# Patient Record
Sex: Female | Born: 1980 | Race: White | Hispanic: No | Marital: Married | Smoking: Never smoker
Health system: Southern US, Community
[De-identification: ages and names within clinical notes are randomized; demographics above are authoritative.]

## PROBLEM LIST (undated history)

## (undated) DIAGNOSIS — R0602 Shortness of breath: Secondary | ICD-10-CM

## (undated) DIAGNOSIS — K219 Gastro-esophageal reflux disease without esophagitis: Secondary | ICD-10-CM

## (undated) DIAGNOSIS — N63 Unspecified lump in unspecified breast: Secondary | ICD-10-CM

## (undated) DIAGNOSIS — L708 Other acne: Secondary | ICD-10-CM

## (undated) DIAGNOSIS — R911 Solitary pulmonary nodule: Secondary | ICD-10-CM

## (undated) DIAGNOSIS — G43909 Migraine, unspecified, not intractable, without status migrainosus: Secondary | ICD-10-CM

## (undated) DIAGNOSIS — G40A09 Absence epileptic syndrome, not intractable, without status epilepticus: Secondary | ICD-10-CM

## (undated) DIAGNOSIS — K56609 Unspecified intestinal obstruction, unspecified as to partial versus complete obstruction: Secondary | ICD-10-CM

## (undated) DIAGNOSIS — N920 Excessive and frequent menstruation with regular cycle: Secondary | ICD-10-CM

## (undated) DIAGNOSIS — B958 Unspecified staphylococcus as the cause of diseases classified elsewhere: Secondary | ICD-10-CM

## (undated) DIAGNOSIS — O09299 Supervision of pregnancy with other poor reproductive or obstetric history, unspecified trimester: Secondary | ICD-10-CM

## (undated) DIAGNOSIS — R569 Unspecified convulsions: Secondary | ICD-10-CM

## (undated) DIAGNOSIS — A419 Sepsis, unspecified organism: Secondary | ICD-10-CM

## (undated) DIAGNOSIS — G40909 Epilepsy, unspecified, not intractable, without status epilepticus: Secondary | ICD-10-CM

## (undated) DIAGNOSIS — M797 Fibromyalgia: Secondary | ICD-10-CM

## (undated) DIAGNOSIS — N200 Calculus of kidney: Secondary | ICD-10-CM

## (undated) DIAGNOSIS — G930 Cerebral cysts: Secondary | ICD-10-CM

## (undated) DIAGNOSIS — N841 Polyp of cervix uteri: Secondary | ICD-10-CM

## (undated) DIAGNOSIS — K589 Irritable bowel syndrome without diarrhea: Secondary | ICD-10-CM

## (undated) DIAGNOSIS — C4370 Malignant melanoma of unspecified lower limb, including hip: Secondary | ICD-10-CM

## (undated) DIAGNOSIS — R87619 Unspecified abnormal cytological findings in specimens from cervix uteri: Secondary | ICD-10-CM

## (undated) HISTORY — PX: OTHER SURGICAL HISTORY: SHX169

## (undated) HISTORY — DX: Irritable bowel syndrome, unspecified: K58.9

## (undated) HISTORY — DX: Migraine, unspecified, not intractable, without status migrainosus: G43.909

## (undated) HISTORY — PX: BREAST EXCISIONAL BIOPSY: SUR124

## (undated) HISTORY — DX: Solitary pulmonary nodule: R91.1

## (undated) HISTORY — PX: MELANOMA EXCISION: SHX5266

---

## 2001-07-27 ENCOUNTER — Ambulatory Visit (HOSPITAL_COMMUNITY): Admission: RE | Admit: 2001-07-27 | Discharge: 2001-07-27 | Payer: Self-pay | Admitting: Obstetrics & Gynecology

## 2001-07-27 ENCOUNTER — Encounter: Payer: Self-pay | Admitting: Obstetrics & Gynecology

## 2001-08-07 ENCOUNTER — Observation Stay (HOSPITAL_COMMUNITY): Admission: RE | Admit: 2001-08-07 | Discharge: 2001-08-07 | Payer: Self-pay | Admitting: Obstetrics and Gynecology

## 2001-11-15 ENCOUNTER — Ambulatory Visit (HOSPITAL_COMMUNITY): Admission: AD | Admit: 2001-11-15 | Discharge: 2001-11-15 | Payer: Self-pay | Admitting: Obstetrics & Gynecology

## 2001-12-15 ENCOUNTER — Ambulatory Visit (HOSPITAL_COMMUNITY): Admission: AD | Admit: 2001-12-15 | Discharge: 2001-12-15 | Payer: Self-pay | Admitting: Internal Medicine

## 2001-12-29 ENCOUNTER — Ambulatory Visit (HOSPITAL_COMMUNITY): Admission: AD | Admit: 2001-12-29 | Discharge: 2001-12-29 | Payer: Self-pay | Admitting: Obstetrics & Gynecology

## 2002-01-19 ENCOUNTER — Ambulatory Visit (HOSPITAL_COMMUNITY): Admission: AD | Admit: 2002-01-19 | Discharge: 2002-01-19 | Payer: Self-pay | Admitting: Obstetrics & Gynecology

## 2002-01-23 ENCOUNTER — Ambulatory Visit (HOSPITAL_COMMUNITY): Admission: AD | Admit: 2002-01-23 | Discharge: 2002-01-23 | Payer: Self-pay | Admitting: Obstetrics & Gynecology

## 2002-01-26 ENCOUNTER — Ambulatory Visit (HOSPITAL_COMMUNITY): Admission: AD | Admit: 2002-01-26 | Discharge: 2002-01-26 | Payer: Self-pay | Admitting: Obstetrics and Gynecology

## 2002-01-30 ENCOUNTER — Ambulatory Visit (HOSPITAL_COMMUNITY): Admission: AD | Admit: 2002-01-30 | Discharge: 2002-01-30 | Payer: Self-pay | Admitting: Obstetrics & Gynecology

## 2002-02-02 ENCOUNTER — Observation Stay (HOSPITAL_COMMUNITY): Admission: AD | Admit: 2002-02-02 | Discharge: 2002-02-03 | Payer: Self-pay | Admitting: Obstetrics & Gynecology

## 2002-02-06 ENCOUNTER — Inpatient Hospital Stay (HOSPITAL_COMMUNITY): Admission: AD | Admit: 2002-02-06 | Discharge: 2002-02-10 | Payer: Self-pay | Admitting: Obstetrics and Gynecology

## 2003-03-08 ENCOUNTER — Emergency Department (HOSPITAL_COMMUNITY): Admission: EM | Admit: 2003-03-08 | Discharge: 2003-03-08 | Payer: Self-pay | Admitting: Emergency Medicine

## 2003-03-08 ENCOUNTER — Encounter: Payer: Self-pay | Admitting: Emergency Medicine

## 2004-02-19 ENCOUNTER — Emergency Department (HOSPITAL_COMMUNITY): Admission: EM | Admit: 2004-02-19 | Discharge: 2004-02-19 | Payer: Self-pay | Admitting: Emergency Medicine

## 2004-08-15 ENCOUNTER — Emergency Department (HOSPITAL_COMMUNITY): Admission: EM | Admit: 2004-08-15 | Discharge: 2004-08-15 | Payer: Self-pay | Admitting: Emergency Medicine

## 2005-01-21 ENCOUNTER — Ambulatory Visit (HOSPITAL_COMMUNITY): Admission: RE | Admit: 2005-01-21 | Discharge: 2005-01-21 | Payer: Self-pay | Admitting: Family Medicine

## 2005-05-10 ENCOUNTER — Emergency Department (HOSPITAL_COMMUNITY): Admission: EM | Admit: 2005-05-10 | Discharge: 2005-05-10 | Payer: Self-pay | Admitting: *Deleted

## 2005-08-05 ENCOUNTER — Ambulatory Visit (HOSPITAL_COMMUNITY): Admission: RE | Admit: 2005-08-05 | Discharge: 2005-08-05 | Payer: Self-pay | Admitting: Family Medicine

## 2006-05-01 ENCOUNTER — Inpatient Hospital Stay (HOSPITAL_COMMUNITY): Admission: EM | Admit: 2006-05-01 | Discharge: 2006-05-05 | Payer: Self-pay | Admitting: Emergency Medicine

## 2006-05-01 ENCOUNTER — Ambulatory Visit: Payer: Self-pay | Admitting: Internal Medicine

## 2006-06-15 HISTORY — PX: BREAST LUMPECTOMY: SHX2

## 2006-09-01 ENCOUNTER — Ambulatory Visit (HOSPITAL_COMMUNITY): Admission: RE | Admit: 2006-09-01 | Discharge: 2006-09-01 | Payer: Self-pay | Admitting: Family Medicine

## 2006-09-20 ENCOUNTER — Ambulatory Visit (HOSPITAL_COMMUNITY): Admission: RE | Admit: 2006-09-20 | Discharge: 2006-09-20 | Payer: Self-pay | Admitting: General Surgery

## 2006-09-20 ENCOUNTER — Encounter (INDEPENDENT_AMBULATORY_CARE_PROVIDER_SITE_OTHER): Payer: Self-pay | Admitting: *Deleted

## 2007-01-31 ENCOUNTER — Ambulatory Visit (HOSPITAL_COMMUNITY): Admission: RE | Admit: 2007-01-31 | Discharge: 2007-01-31 | Payer: Self-pay | Admitting: Family Medicine

## 2007-04-15 ENCOUNTER — Emergency Department (HOSPITAL_COMMUNITY): Admission: EM | Admit: 2007-04-15 | Discharge: 2007-04-15 | Payer: Self-pay | Admitting: Emergency Medicine

## 2007-10-08 ENCOUNTER — Emergency Department (HOSPITAL_COMMUNITY): Admission: EM | Admit: 2007-10-08 | Discharge: 2007-10-08 | Payer: Self-pay | Admitting: Emergency Medicine

## 2008-03-05 ENCOUNTER — Ambulatory Visit (HOSPITAL_COMMUNITY): Admission: RE | Admit: 2008-03-05 | Discharge: 2008-03-05 | Payer: Self-pay | Admitting: Family Medicine

## 2008-09-12 ENCOUNTER — Emergency Department (HOSPITAL_COMMUNITY): Admission: EM | Admit: 2008-09-12 | Discharge: 2008-09-12 | Payer: Self-pay | Admitting: Emergency Medicine

## 2008-09-13 ENCOUNTER — Ambulatory Visit (HOSPITAL_COMMUNITY): Admission: RE | Admit: 2008-09-13 | Discharge: 2008-09-13 | Payer: Self-pay | Admitting: Family Medicine

## 2008-09-14 ENCOUNTER — Encounter: Payer: Self-pay | Admitting: Pulmonary Disease

## 2008-11-21 ENCOUNTER — Emergency Department (HOSPITAL_COMMUNITY): Admission: EM | Admit: 2008-11-21 | Discharge: 2008-11-21 | Payer: Self-pay | Admitting: Emergency Medicine

## 2008-12-21 ENCOUNTER — Encounter: Payer: Self-pay | Admitting: Pulmonary Disease

## 2009-08-30 ENCOUNTER — Encounter: Payer: Self-pay | Admitting: Pulmonary Disease

## 2009-08-30 ENCOUNTER — Encounter: Admission: RE | Admit: 2009-08-30 | Discharge: 2009-08-30 | Payer: Self-pay | Admitting: Family Medicine

## 2009-08-30 ENCOUNTER — Ambulatory Visit (HOSPITAL_COMMUNITY): Admission: RE | Admit: 2009-08-30 | Discharge: 2009-08-30 | Payer: Self-pay | Admitting: Family Medicine

## 2009-09-11 ENCOUNTER — Ambulatory Visit: Payer: Self-pay | Admitting: Pulmonary Disease

## 2009-09-11 DIAGNOSIS — J309 Allergic rhinitis, unspecified: Secondary | ICD-10-CM | POA: Insufficient documentation

## 2009-09-17 DIAGNOSIS — J984 Other disorders of lung: Secondary | ICD-10-CM | POA: Insufficient documentation

## 2009-10-02 ENCOUNTER — Ambulatory Visit (HOSPITAL_BASED_OUTPATIENT_CLINIC_OR_DEPARTMENT_OTHER): Admission: RE | Admit: 2009-10-02 | Discharge: 2009-10-02 | Payer: Self-pay | Admitting: General Surgery

## 2009-10-02 ENCOUNTER — Encounter: Admission: RE | Admit: 2009-10-02 | Discharge: 2009-10-02 | Payer: Self-pay | Admitting: General Surgery

## 2010-01-13 ENCOUNTER — Emergency Department (HOSPITAL_BASED_OUTPATIENT_CLINIC_OR_DEPARTMENT_OTHER): Admission: EM | Admit: 2010-01-13 | Discharge: 2010-01-13 | Payer: Self-pay | Admitting: Emergency Medicine

## 2010-03-21 ENCOUNTER — Encounter: Admission: RE | Admit: 2010-03-21 | Discharge: 2010-03-21 | Payer: Self-pay | Admitting: Family Medicine

## 2010-05-02 ENCOUNTER — Encounter: Payer: Self-pay | Admitting: Cardiology

## 2010-05-09 ENCOUNTER — Encounter: Payer: Self-pay | Admitting: Cardiology

## 2010-06-11 DIAGNOSIS — G43909 Migraine, unspecified, not intractable, without status migrainosus: Secondary | ICD-10-CM | POA: Insufficient documentation

## 2010-06-11 DIAGNOSIS — K589 Irritable bowel syndrome without diarrhea: Secondary | ICD-10-CM | POA: Insufficient documentation

## 2010-06-12 ENCOUNTER — Encounter: Payer: Self-pay | Admitting: Cardiology

## 2010-06-12 ENCOUNTER — Ambulatory Visit: Payer: Self-pay | Admitting: Cardiology

## 2010-06-12 DIAGNOSIS — R002 Palpitations: Secondary | ICD-10-CM | POA: Insufficient documentation

## 2010-06-12 DIAGNOSIS — R072 Precordial pain: Secondary | ICD-10-CM | POA: Insufficient documentation

## 2010-06-20 ENCOUNTER — Other Ambulatory Visit: Payer: Self-pay | Admitting: Cardiology

## 2010-06-20 ENCOUNTER — Ambulatory Visit (HOSPITAL_COMMUNITY)
Admission: RE | Admit: 2010-06-20 | Discharge: 2010-06-20 | Payer: Self-pay | Source: Home / Self Care | Attending: Cardiology | Admitting: Cardiology

## 2010-06-20 ENCOUNTER — Ambulatory Visit: Admission: RE | Admit: 2010-06-20 | Discharge: 2010-06-20 | Payer: Self-pay | Source: Home / Self Care

## 2010-07-15 NOTE — Assessment & Plan Note (Signed)
Summary: consult for pulmonary nodules   Primary Provider/Referring Provider:  Paulene Floor NP [facility]  CC:  Pulmonary consult for pulmonary nodule. The patient c/o fatigue increased sob when singing x1 year. Pt brought CD of CT chest from [facility] and also had scans at [facility] and [facility]..  History of Present Illness: The pt is a 30y/o female who I have been asked to see for pulmonary nodules.  These were first noted in 2010 after a ct was done for lumbar strain.  She had a f/u ct in July of 2010 with no change in the size of the nodules in question, and then again the beginning of this month which showed no change.  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  She has had a negative PPD in the past.  She denies any cough, unexplained weight loss, or anorexia.  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  She has apptm to see a Careers adviser next month.  She also tells me that she has suspicious lesions on her right hip that may be related to melanoma.  She has f/u for this as well.  Preventive Screening-Counseling & Management  Alcohol-Tobacco     Alcohol drinks/day: 0     Smoking Status: never  Current Medications (verified): 1)  Adipex-P 37.5 Mg Caps (Phentermine Hcl) .Marland Kitchen.. 1 By Mouth Daily  Allergies (verified): 1)  ! Sulfa  Past History:  Past Medical History: Current Problems:  ALLERGIC RHINITIS (ICD-477.9)  Past Surgical History: Lumpectomy---right breast---2008  Family History: Reviewed history and no changes required. Family History Emphysema ---PGM and PGF Heart disease---PGM Throat cancer---PGF  Social History: Reviewed history and no changes required. 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  The patient denies shortness of breath at rest, productive cough, non-productive cough, coughing up blood, chest pain, irregular heartbeats, acid heartburn, indigestion, loss of appetite, weight change, abdominal pain, difficulty swallowing, sore throat, tooth/dental problems, sneezing, itching, ear ache, anxiety, hand/feet swelling, joint stiffness or pain, rash, change in color of mucus, and fever.    Vital Signs:  Patient profile:   30 year old female Height:      62 inches (157.48 cm) Weight:      174.50 pounds (79.32 kg) BMI:     32.03 O2 Sat:      97 % on Room air Temp:     98.1 degrees F (36.72 degrees C) oral Pulse rate:   134 / minute BP sitting:   122 / 80  (left arm) Cuff size:   regular  Vitals Entered By: Michel Bickers CMA (September 11, 2009 10:19 AM)  O2 Sat at Rest %:  97 O2 Flow:  Room air CC: Pulmonary consult for pulmonary nodule. The patient c/o fatigue increased sob when singing x1 year. Pt brought CD of CT chest from [facility] and also had scans at [facility] and [facility].   Physical Exam  General:  ow female in nad Eyes:  PERRLA and EOMI  Nose:  patent without discharge Mouth:  normal  Lungs:  clear to auscultation  Heart:  rrr, no mrg Abdomen:  soft and nontender, bs+ Extremities:  no edema noted or cyanosis  Neurologic:  alert and oriented, moves all 4.   Impression & Recommendations:  Problem # 1:  PULMONARY NODULE (ICD-518.89) the pt has pulmonary nodules on ct chest that do not appear overly suspicious, and have not changed in a year.  They are most likely inflammatory.  The pt has no history to suggest the etiology for the nodules, but does apparently have ongoing evaluations for possible neoplasia.  By established criteria, if she is in a low risk group, no further radiographic f/u is required.  I have told her however, that if she is given a diagnosis of some type of cancer,  she will need to let me know so that further radiographic f/u can be arranged.  The pt has voiced understanding.  Medications Added to Medication List This Visit: 1)  Adipex-p 37.5 Mg Caps (Phentermine hcl) .Marland Kitchen.. 1 by mouth daily  Other Orders: Consultation Level IV (16109)  Patient Instructions: 1)  no further followup required of your nodules.  IF you are given a cancer diagnosis from hip or breast, you need to let me know so that we can f/u these spots more closely for one more year.

## 2010-07-15 NOTE — Letter (Signed)
Summary: Out of Work  [employer]  [address]   [place], [place] [address]   Phone: [phone]  Fax: [phone]    September 11, 2009   Employee:  Schae A [name]    To Whom It May Concern:   For Medical reasons, please excuse the above named employee from work for the following dates:  Start:   09/11/2009  End:   09/11/2009  If you need additional information, please feel free to contact our office.         Sincerely    Marcelyn Bruins, M. D.

## 2010-07-17 NOTE — Assessment & Plan Note (Signed)
Summary: NP-HEART PALP,HEAVINESS OF CHEST   Primary Provider:  Paulene Floor NP [facility]  CC:  pt complains of chest pain which comes and goes also pt states the pain radiates to her left arm.. Severity of pain 7 to 8 pt takes tylenol to decrease pain pt states it helps some.  History of Present Illness: 30 year old female with no past medical history for evaluation of chest pain and palpitations. Holter monitor in November of 2011 revealed sinus to sinus tachycardia. TSH was normal. Patient recently was placed on prednisone and claravis in Oct 2011. Since then she has noticed intermittent chest pain. It is substernal and radiates to her left axilla. It is described as a pressure. It lasts hours. It occurs on a daily basis. It is not pleuritic or exertional. It resolves spontaneously. There is associated shortness of breath, nausea and diaphoresis by her report. She also notices her heart pounding. It has been as high as 150. She also notes dyspnea on exertion. She has not had syncope. Note she did have the palpitations with her monitor in place. Prior to initiating the medications she did not have dyspnea on exertion, exertional chest pain or palpitations. 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 Because of the above were asked to further evaluate.  Preventive Screening-Counseling & Management  Alcohol-Tobacco     Smoking Status: never  Current Medications (verified): 1)  Claravis 40 Mg Caps (Isotretinoin) .Marland Kitchen.. 1  Tab By Mouth Once Daily 2)  Prednisone 1 Mg Tabs (Prednisone) .... Up To 9mg  Daily 3)  Flexeril 10 Mg Tabs (Cyclobenzaprine Hcl) .Marland Kitchen.. 1 Tab By Mouth Three Times A Day 4)  Bystolic 5 Mg Tabs (Nebivolol Hcl) .Marland Kitchen.. 1 Tab By Mouth Once Daily  Allergies: 1)  ! Sulfa  Past History:  Past Medical History: MIGRAINE HEADACHE  IBS PULMONARY NODULE (Previous evaluation - benign) ALLERGIC RHINITIS  Past Surgical History: Lumpectomy---right breast---2008 -  benign Needle-localized excision of right breast mass x2. Caesarean section  Family History: Reviewed history from 09/11/2009 and no changes required. Family History Emphysema ---PGM and PGF Heart disease---PGM Throat cancer---PGF No premature CAD in immediate family  Social History: Reviewed history from 09/11/2009 and no changes required. 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  Alcohol Use - yes (rare)  Review of Systems       Complains of anxious feelings and bilateral lower extremity pain but no fevers or chills, productive cough, hemoptysis, dysphasia, odynophagia, melena, hematochezia, dysuria, hematuria, rash, seizure activity, orthopnea, PND, pedal edema, claudication. Remaining systems are negative.   Vital Signs:  Patient profile:   30 year old female Height:      62 inches Weight:      219 pounds BMI:     40.20 Pulse rate:   96 / minute Resp:     14 per minute BP sitting:   107 / 78  (left arm)  Vitals Entered By: Kem Parkinson (June 12, 2010 2:32 PM)  Physical Exam  General:  Well developed/well nourished in NAD Skin warm/dry Patient not depressed No peripheral clubbing Back-normal HEENT-normal/normal eyelids Neck supple/normal carotid upstroke bilaterally; no bruits; no JVD; no thyromegaly chest - CTA/ normal expansion CV - RRR/normal S1 and S2; no murmurs, rubs or gallops;  PMI nondisplaced Abdomen -NT/ND, no HSM, no mass, + bowel sounds, no bruit 2+ femoral pulses, no bruits Ext-no edema, chords, 2+ DP Neuro-grossly nonfocal     EKG  Procedure  date:  06/12/2010  Findings:      Sinus rhythm at a rate of 95 LVH. Nonspecific ST changes.  Impression & Recommendations:  Problem # 1:  PALPITATIONS (ICD-785.1) Patient's Holter monitor showed sinus to sinus tachycardia and she did have symptoms while wearing this. Continue beta blocker. All of her symptoms began after initiating prednisone and  claravis. Claravis can be associated with palpitations. I've asked her to discuss this with her dermatologist. These will most likely need to be weaned and other medications used for her acne. I will plan an echocardiogram. Note recent TSH normal. Her updated medication list for this problem includes:    Bystolic 5 Mg Tabs (Nebivolol hcl) .Marland Kitchen... 1 tab by mouth once daily  Orders: Echocardiogram (Echo)  Problem # 2:  CHEST PAIN, PRECORDIAL (ICD-786.51) Symptoms atypical and temporally related to above medications. They will need to be weaned to off by her dermatologist. Schedule echocardiogram. Her updated medication list for this problem includes:    Bystolic 5 Mg Tabs (Nebivolol hcl) .Marland Kitchen... 1 tab by mouth once daily  Orders: Echocardiogram (Echo)  Problem # 3:  IBS (ICD-564.1)  Problem # 4:  MIGRAINE HEADACHE (ICD-346.90)  Her updated medication list for this problem includes:    Bystolic 5 Mg Tabs (Nebivolol hcl) .Marland Kitchen... 1 tab by mouth once daily  Patient Instructions: 1)  Your physician has requested that you have an echocardiogram.  Echocardiography is a painless test that uses sound waves to create images of your heart. It provides your doctor with information about the size and shape of your heart and how well your heart's chambers and valves are working.  This procedure takes approximately one hour. There are no restrictions for this procedure.

## 2010-07-17 NOTE — Letter (Signed)
Summary: External Correspondence/ [facility]  External Correspondence/ [facility]   Imported By: Dorise Hiss 06/04/2010 17:04:20  _____________________________________________________________________  External Attachment:    Type:   Image     Comment:   External Document

## 2010-07-17 NOTE — Letter (Signed)
Summary: External Correspondence/ [facility]  External Correspondence/ [facility]   Imported By: Dorise Hiss 06/04/2010 17:01:45  _____________________________________________________________________  External Attachment:    Type:   Image     Comment:   External Document

## 2010-07-17 NOTE — Procedures (Signed)
Summary: Holter and Event/ [facility]/ [facility]  Holter and Event/ [facility]/ [facility]   Imported By: Dorise Hiss 06/04/2010 16:58:47  _____________________________________________________________________  External Attachment:    Type:   Image     Comment:   External Document

## 2010-08-06 ENCOUNTER — Ambulatory Visit (INDEPENDENT_AMBULATORY_CARE_PROVIDER_SITE_OTHER): Payer: BC Managed Care – PPO | Admitting: Cardiology

## 2010-08-06 ENCOUNTER — Encounter (INDEPENDENT_AMBULATORY_CARE_PROVIDER_SITE_OTHER): Payer: BC Managed Care – PPO

## 2010-08-06 ENCOUNTER — Encounter: Payer: Self-pay | Admitting: Cardiology

## 2010-08-06 DIAGNOSIS — R002 Palpitations: Secondary | ICD-10-CM

## 2010-08-12 NOTE — Assessment & Plan Note (Signed)
Summary: rov/Irregular heart beat/per pt call=mj appt confirm=mj   Primary Provider:  Paulene Floor NP [facility]   History of Present Illness: 30 year old female with no past medical history who I saw in Dec 2011 for evaluation of chest pain and palpitations. Holter monitor in November of 2011 revealed sinus to sinus tachycardia. TSH was normal. Patient recently was on prednisone and claravis in Oct 2011 and symptoms began after initiating. We recommended that she followup with her dermatologist for consideration of a different regimen for her acne. Echocardiogram in January of 2012 showed normal LV function, mild mitral regurgitation and trace aortic insufficiency. Since I last saw her, her prednisone has been weaned but her claravis has continued. She continues to have palpitations that are brief. She has some dizziness with standing but no syncope. There is no chest pain or increased shortness of breath.   Current Medications (verified): 1)  Claravis 40 Mg Caps (Isotretinoin) .... One and One Half Tablet Once Daily 2)  Prednisone 1 Mg Tabs (Prednisone) .... Up To 9mg  Daily 3)  Flexeril 10 Mg Tabs (Cyclobenzaprine Hcl) .Marland Kitchen.. 1 Tab By Mouth Three Times A Day 4)  Clonazepam 0.5 Mg Tabs (Clonazepam) .... One Tablet By Mouth Hs  Allergies (verified): 1)  ! Sulfa  Past History:  Past Medical History: Reviewed history from 06/12/2010 and no changes required. MIGRAINE HEADACHE  IBS PULMONARY NODULE (Previous evaluation - benign) ALLERGIC RHINITIS  Past Surgical History: Reviewed history from 06/12/2010 and no changes required. Lumpectomy---right breast---2008 - benign Needle-localized excision of right breast mass x2. Caesarean section  Social History: Reviewed history from 06/12/2010 and no changes required. 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  Alcohol Use - yes (rare)  Review of Systems       no fevers or chills,  productive cough, hemoptysis, dysphasia, odynophagia, melena, hematochezia, dysuria, hematuria, rash, seizure activity, orthopnea, PND, pedal edema, claudication. Remaining systems are negative.   Vital Signs:  Patient profile:   30 year old female Weight:      223 pounds Pulse rate:   90 / minute Pulse rhythm:   irregular BP sitting:   112 / 62  (left arm)  Vitals Entered By: Deliah Goody, RN (August 06, 2010 8:58 AM)  Physical Exam  General:  Well-developed well-nourished in no acute distress.  Skin is warm and dry.  HEENT is normal.  Neck is supple. No thyromegaly.  Chest is clear to auscultation with normal expansion.  Cardiovascular exam is regular rate and rhythm.  Abdominal exam nontender or distended. No masses palpated. Extremities show no edema. neuro grossly intact    Impression & Recommendations:  Problem # 1:  PALPITATIONS (ICD-785.1) Patient continues to have palpitations that I think are most likely related to claravis. However she feels it may certainly related to prednisone which is being weaned. She also is now not clear that she had symptoms while her previous Holter was in place. I will schedule a [facility]io[facility]. If her symptoms persist despite weaning prednisone and she will followup with her dermatologist for consideration of a different medication other than claravis.  The following medications were removed from the medication list:    Bystolic 5 Mg Tabs (Nebivolol hcl) .Marland Kitchen... 1 tab by mouth once daily  Problem # 2:  CHEST PAIN, PRECORDIAL (ICD-786.51) No further symptoms. No further workup. The following medications were removed from the medication list:    Bystolic 5 Mg Tabs (Nebivolol hcl) .Marland KitchenMarland KitchenMarland KitchenMarland Kitchen  1 tab by mouth once daily  Problem # 3:  IBS (ICD-564.1)  Patient Instructions: 1)  Your physician recommends that you schedule a follow-up appointment in: 3 MONTHS 2)  Your physician has recommended that you wear an event monitor.  Event monitors are medical  devices that record the heart's electrical activity. Doctors most often use these monitors to diagnose arrhythmias. Arrhythmias are problems with the speed or rhythm of the heartbeat. The monitor is a small, portable device. You can wear one while you do your normal daily activities. This is usually used to diagnose what is causing palpitations/syncope (passing out).

## 2010-08-14 ENCOUNTER — Ambulatory Visit: Payer: Self-pay | Admitting: Cardiology

## 2010-08-16 ENCOUNTER — Inpatient Hospital Stay (INDEPENDENT_AMBULATORY_CARE_PROVIDER_SITE_OTHER)
Admission: RE | Admit: 2010-08-16 | Discharge: 2010-08-16 | Disposition: A | Discharge status: Home / Self Care | Payer: BC Managed Care – PPO | Source: Ambulatory Visit | Attending: Family Medicine | Admitting: Family Medicine

## 2010-08-16 DIAGNOSIS — K602 Anal fissure, unspecified: Secondary | ICD-10-CM

## 2010-08-16 DIAGNOSIS — R197 Diarrhea, unspecified: Secondary | ICD-10-CM

## 2010-09-05 ENCOUNTER — Other Ambulatory Visit: Payer: Self-pay | Admitting: Internal Medicine

## 2010-09-05 DIAGNOSIS — Z1239 Encounter for other screening for malignant neoplasm of breast: Secondary | ICD-10-CM

## 2010-09-09 ENCOUNTER — Ambulatory Visit
Admission: RE | Admit: 2010-09-09 | Discharge: 2010-09-09 | Disposition: A | Discharge status: Home / Self Care | Payer: BC Managed Care – PPO | Source: Ambulatory Visit | Attending: Internal Medicine | Admitting: Internal Medicine

## 2010-09-09 DIAGNOSIS — Z1239 Encounter for other screening for malignant neoplasm of breast: Secondary | ICD-10-CM

## 2010-09-10 ENCOUNTER — Telehealth: Payer: Self-pay | Admitting: *Deleted

## 2010-09-10 NOTE — Telephone Encounter (Signed)
Pt aware monitor reviewed by dr Jens Som shows sinus tach.

## 2010-09-22 LAB — PREGNANCY, URINE: Preg Test, Ur: NEGATIVE

## 2010-09-25 LAB — CBC
Hemoglobin: 11.5 g/dL — ABNORMAL LOW (ref 12.0–15.0)
MCHC: 34.4 g/dL (ref 30.0–36.0)
MCV: 87.1 fL (ref 78.0–100.0)
RBC: 3.84 MIL/uL — ABNORMAL LOW (ref 3.87–5.11)

## 2010-09-25 LAB — DIFFERENTIAL
Basophils Relative: 0 % (ref 0–1)
Eosinophils Absolute: 0.1 10*3/uL (ref 0.0–0.7)
Monocytes Absolute: 1 10*3/uL (ref 0.1–1.0)
Monocytes Relative: 10 % (ref 3–12)

## 2010-09-25 LAB — PREGNANCY, URINE: Preg Test, Ur: NEGATIVE

## 2010-09-25 LAB — BASIC METABOLIC PANEL
CO2: 29 mEq/L (ref 19–32)
Chloride: 102 mEq/L (ref 96–112)
GFR calc Af Amer: >60 mL/min (ref >60)
Sodium: 135 mEq/L (ref 135–145)

## 2010-09-25 LAB — URINALYSIS, ROUTINE W REFLEX MICROSCOPIC
Bilirubin Urine: NEGATIVE
Hgb urine dipstick: NEGATIVE
Protein, ur: NEGATIVE mg/dL
Urobilinogen, UA: 0.2 mg/dL (ref 0.0–1.0)

## 2010-09-25 LAB — URINE MICROSCOPIC-ADD ON

## 2010-10-28 ENCOUNTER — Encounter: Payer: Self-pay | Admitting: Cardiology

## 2010-10-31 NOTE — H&P (Signed)
   NAME:  [name], Odelle                           ACCOUNT NO.:  [id number]   MEDICAL RECORD NO.:  [id number]                    PATIENT TYPE:   LOCATION:                                       FACILITY:   PHYSICIAN:  [id number]                                DATE OF BIRTH:  12/11/1980   DATE OF ADMISSION:  02/06/2002  DATE OF DISCHARGE:                                HISTORY & PHYSICAL   REASON FOR ADMISSION:  Pregnancy at 30 weeks, for induction of labor due to  prior history of IUFD.   HISTORY OF PRESENT ILLNESS:  The patient is a 30 year old gravida 3, para 1,  abortus 1, who has no living children, who had a fetal demise at 30 weeks  with her prior pregnancy and a miscarriage in 2001.  She is being induced  due to prior history of intrauterine fetal death and also maternal fatigue.   Prenatal course has been essentially uneventful.  She has been followed very  closely due to her previous history, but NST has been reactive.  Blood type  is O positive, rubella is immune.  Hepatitis B surface antigen is negative.  HIV is negative.  HSV is negative.  GC and Chlamydia are negative.  MSAFP is  within normal limits.  GBS is negative.  Twenty-eight week hemoglobin was  11, 28-week hematocrit 34.4, and one-hour glucose was 34.   PAST MEDICAL HISTORY:  Positive for migraines.   PAST SURGICAL HISTORY:  Negative.   ALLERGIES:  ASPIRIN, it makes her nauseated.   MEDICATIONS:  Prenatal vitamins.   PHYSICAL EXAMINATION:  VITAL SIGNS:  Weight is 211, blood pressure 100/60,  fundal height is 39 cm.  Fetal heart tones 150s, strong and regular.  PELVIC:  Cervix was a tight fingertip, -2 to -3, soft, posterior.   PLAN:  We are going to admit her tonight for Cervidil induction of labor.       Zerita Boers, N.M.                      [phone]    DL/MEDQ  D:  30/30/5784  T:  02/06/2002  Job:  [id number]

## 2010-10-31 NOTE — Discharge Summary (Signed)
NAME:  [name], [name]               ACCOUNT NO.:  [id number]   MEDICAL RECORD NO.:  [id number]          PATIENT TYPE:  INP   LOCATION:  A310                          FACILITY:  [facility]   PHYSICIAN:  [name] Shipper, MD     DATE OF BIRTH:  Dec 28, 1980   DATE OF ADMISSION:  05/01/2006  DATE OF DISCHARGE:  11/21/2007LH                               DISCHARGE SUMMARY   Please see H&P dictated by Dr. Constance [name], actually, on November 17 for  details regarding patient's presenting illness.   DISCHARGE DIAGNOSES:  1. Acute enteritis, likely infectious, improved.  2. History of migraines, improved.  3. Hypokalemia requiring replacement.  4. Anemia of iron deficiency requiring follow-up.  5. Low B12 level, also requiring follow-up.   BRIEF HOSPITAL COURSE:  Problem 1.  ACUTE ENTERITIS:  This is a 30 year old Caucasian female who  had history of migraines, who developed abdominal pain one night prior  to her admission.  She described it as a severe pain, crampy in  character, and came into the emergency department.  In the ED she  underwent a CT scan of her abdomen and pelvis, which revealed a short  segment of abnormal small bowel in the central anatomic pelvis,  demonstrating wall thickness.  Mucosal hyperemia and perinephric edema  and inflammation also noted.  Differential diagnosis was  infectious/inflammatory enteritis.  Since she was so young and did not  smoke, ischemic reason was considered less likely.  The patient reported  eating at a [employer] a day prior to her admission.  Her blood  tests showed a leukocytosis, but her LFTs were normal.  She was not  having any diarrhea as well.   The patient was admitted to the hospital, started on fluoroquinolones as  well as Flagyl.  She was kept n.p.o.  GI was following her.  The patient  slowly improved.  She was slowly started back on p.o. intake, which she  tolerated quite well.  Hence, on the day of discharge she was considered  stable enough to go home.   Problem 2.  Anemia was noted during this admission with hemoglobin  between 9.8 and about 12.  Iron profile studies were sent, which  suggested a possibility for iron deficiency.  Ferritin was low as well  as iron and percent saturation.  B12 was also mildly low at 256.  The  patient was started on Nu-Iron.  Occult blood testing of her feces was  done, which were negative x3.  She was also given one IM shot of B12  1000 mcg.  The patient, upon asking about her menstrual history, told me  that she usually has very light bleeding.  Hence, I think her anemia  needs to be worked up at this time.  I will refer her to Dr. Mariel [name].   Problem 3.  Hypokalemia was noted today.  She will be replaced with  potassium before she is discharged.   Problem 4.  The patient was also noted to run low blood pressures.  A  cortisol level was checked, which was normal.  It is likely that  her  blood pressure always runs low.  The patient was completely asymptomatic  and her heart rate was in the normal range.   DISCHARGE MEDICATIONS:  1. Cipro 500 mg p.o. b.i.d. for 10 days.  2. Flagyl 250 mg p.o. t.i.d. for 10 days.  3. Levsin 0.125 mg sublingually t.i.d.  4. She was prescribed some Vicodin 5/500 mg every 4 hours as needed      for pain, 30 tablets were prescribed.   FOLLOW-UP:  1. With Dr. Karilyn [name] in 1-2 weeks to follow up on her enteritis.  2. With Dr. Mariel [name], to be scheduled, for anemia and B12 deficiency.   The patient was encouraged to find a PMD for any other medical needs  that she may have.   DIET:  She was asked to eat a full liquid diet for the next 2 days,  followed by a bland diet for the next 3 days, and then she was asked to  resume her previous level of diet but was told to avoid any oily and  fried food.   PHYSICAL ACTIVITY:  No restrictions.   Consultation from gastroenterology service was obtained.   TOTAL TIME SPENT AT DISCHARGE:  40 minutes.    Please note, the patient required financial assistance with her  medications, which was provided.  She was also given a prescription for  a vitamin B12 1000 mg IM after a month.      [name] Shipper, MD  Electronically Signed     GK/MEDQ  D:  05/05/2006  T:  05/05/2006  Job:  [id number]   cc:   [name], M.D.  [address]  [place]  [place] [address]   [name] Horns. [name] Sleet, MD  Fax: [phone]

## 2010-10-31 NOTE — Discharge Summary (Signed)
   NAME:  [name]                         ACCOUNT NO.:  [id number]   MEDICAL RECORD NO.:  [id number]                   PATIENT TYPE:  INP   LOCATION:  A428                                 FACILITY:  [facility]   PHYSICIAN:  Jacklyn Shell, C.N.M.    DATE OF BIRTH:  1980/08/25   DATE OF ADMISSION:  02/06/2002  DATE OF DISCHARGE:  02/10/2002                                 DISCHARGE SUMMARY   HOSPITAL COURSE:  [name] was preinduced with [name] Cervidil on the evening of  August 25 and went through labor with Pitocin for 12 hours on August 26 with  failure to dilate. She was given [name] primary low transverse cesarean section  and had an uneventful postop course. Postop labs within normal limits. The  patient is afebrile. Bottle feeding. She was treated for some mild pitting  edema with hydrochlorothiazide. The day of discharge is February 10, 2002. She  is being sent home with prescriptions for Tylox and Motrin one p.o. q. 6h  p.r.n. Follow-up one week with Dr. Despina Hidden for incision check. Plans [name] Jearld Adjutant  IUD.                                               Jacklyn Shell, C.N.M.    FC/MEDQ  D:  02/10/2002  T:  02/11/2002  Job:  [id number]

## 2010-10-31 NOTE — H&P (Signed)
NAME:  [name], [name]               ACCOUNT NO.:  [id number]   MEDICAL RECORD NO.:  [id number]          PATIENT TYPE:  EMS   LOCATION:  ED                            FACILITY:  [facility]   PHYSICIAN:  Charlynne Pander, MD    DATE OF BIRTH:  [birth year]   DATE OF ADMISSION:  05/01/2006  DATE OF DISCHARGE:  LH                              HISTORY & PHYSICAL   CHIEF COMPLAINT:  Abdominal pain with nausea and vomiting.   HISTORY OF PRESENT ILLNESS:  The patient is a 30 year old [place] female  with past medical history significant for migraines, who developed  abdominal pain last night about 10:00. According to the patient, her  pain was very severe. She rated it at an 8 out of 10, crampy in  character, with radiation towards both sides. She tried to sleep and  napped for about 1 and 1/2 hours but then woke up with much more severe  pain, which was associated with nausea and vomiting. It was crampy in  character as well with radiation to her sides. She denies fever or  chills. There was no chest pain or dyspnea associated with her abdominal  pain. She has her last menstrual period in October and denied any  vaginal discharge or bleeding recently. She did not have frequency or  hematuria. The patient denied diarrhea or constipation associated with  her abdominal pain. She never had similar pain before and she did not  report any family members with nausea, vomiting, or abdominal pain. She  did not travel recently.   PAST MEDICAL HISTORY:  Migraines.   PAST SURGICAL HISTORY:  Cesarean section.   MEDICATIONS:  None.   SOCIAL HISTORY:  The patient denies alcohol or illicit drug abuse.   ALLERGIES:  NO KNOWN DRUG ALLERGIES.   FAMILY HISTORY:  Positive for coronary artery disease, diabetes,  cervical and breast cancer. Negative for hypertension.   REVIEW OF SYSTEMS:  All 10 systems were reviewed and positive as in  history of present illness. Remainder of review of systems negative.   PHYSICAL EXAMINATION:  GENERAL:  A young female in moderate distress  related to abdominal pain.  VITAL SIGNS:  Temperature 97.5, heart rate initially 123. After  hydration and analgesics, it came down to 96. Saturation 99% with  temperature 97.5. Respiratory rate 22.  HEENT:  Nonicteric sclerae. Pupils reactive to light. Extraocular  muscles intact. Oral mucosa moist.  NECK:  Supple. No JVD.  LUNGS:  Clear to auscultation.  HEART:  Regular rhythm and rate at 105 beats per minute on my  evaluation.  ABDOMEN:  With tenderness in periumbilical with guarding but no rebound.  Normal active bowel sounds.  EXTREMITIES:  No pedal edema.  NEUROLOGIC:  No focal motor deficits.  PSYCHIATRIC:  With anxiety but no depression.   LABORATORY DATA/DIAGNOSTIC STUDIES:  WBC 12.6. Hemoglobin and hematocrit  12.6 and 37.4 percent. MCV 87.1, MCH 33.8. Platelet count 301,000.  Sodium 137, potassium 4.0, chloride 106 and carbon dioxide 25. Glucose  88, BUN 7, creatinine 0.6, calcium 0.7. Albumin 3.2. AST 12, ALT 12,  alkaline phosphatase  82 with bilirubin total 0.5. UA was negative for  ketones, negative nitrites, and negative leukocyte esterase. Urine  pregnancy negative.   CT of the abdomen and pelvis revealed short segment of abnormal small  bowel in the central anatomic pelvis, demonstrating wall thickening,  mucosal hyperemia, and perienteric edema and inflammation but no  evidence of perforation or abscess. There was no free fluid or  lymphadenopathy reported and terminal ileum and appendix were normal.   ASSESSMENT/PLAN:  1. Acute enteritis with infectious or inflammatory etiology most      likely. The patient will be initiated on Flagyl and Levaquin. She      will have stools studies. Gastrointestinal consult will be      obtained. The patient will be kept NPO with vigorous rehydration on      anti-emetics and analgesics, with close followup. Ischemia would be      very unlikely.  2.  Migraines, asymptomatic clinically at the present time. The patient      initiated on analgesics and anti-emetics for her abdominal      complaints, which could be used if she developed migraine attack.   All records and the emergency room records were reviewed.      Charlynne Pander, MD  Electronically Signed     JO/MEDQ  D:  05/01/2006  T:  05/01/2006  Job:  [id number]

## 2010-10-31 NOTE — Op Note (Signed)
NAME:  [name], [name]               ACCOUNT NO.:  [id number]   MEDICAL RECORD NO.:  [id number]          PATIENT TYPE:  AMB   LOCATION:  DAY                           FACILITY:  [facility]   PHYSICIAN:  Dalia Heading, M.D.  DATE OF BIRTH:  1980-09-07   DATE OF PROCEDURE:  09/20/2006  DATE OF DISCHARGE:                               OPERATIVE REPORT   PREOPERATIVE DIAGNOSIS:  Right breast neoplasm.   POSTOPERATIVE DIAGNOSIS:  Right breast neoplasm.   PROCEDURE:  Right breast biopsy.   SURGEON:  Dalia Heading, M.D.   ANESTHESIA:  General.   INDICATIONS:  The patient is a 30 year old [place] female who presents  with an enlarging right breast neoplasm.  Biopsies in the past have been  negative for malignancy.  The patient, now, comes to the operating for  right breast biopsy.  The risks and benefits of the procedure including  bleeding, infection, and recurrence of the neoplasm were fully explained  to the patient, who gave informed consent.   PROCEDURE NOTE:  The patient was placed in the supine position.  After  induction of general anesthesia, the right breast was prepped and draped  using the usual sterile technique with Betadine.  Surgical site  confirmation was performed.   An curvilinear incision was made superior to the areola.  This was taken  down to the mass.  The mass measured approximately 3 cm in its greatest  diameter.  It was excised without difficulty.  It was sent to pathology  for further examination.  Any bleeding was controlled using Bovie  electrocautery.  The skin was injected with 0.5 cm Sensorcaine.  The  skin was closed using a 4-0 Vicryl subcuticular suture.  Dermabond was  then applied.   At the end of the case all needle, sponge, and instrument counts were  correct.  The patient was awakened and transferred to PACU in stable  condition.   COMPLICATIONS:  None.   SPECIMEN:  Right breast mass.   BLOOD LOSS:  Minimal.      Dalia Heading, M.D.  Electronically Signed     MAJ/MEDQ  D:  09/20/2006  T:  09/20/2006  Job:  [id number]   cc:   [facility] Department

## 2010-10-31 NOTE — Consult Note (Signed)
NAME:  [name], [name]               ACCOUNT NO.:  [id number]   MEDICAL RECORD NO.:  [id number]          PATIENT TYPE:  INP   LOCATION:  A228                          FACILITY:  [facility]   PHYSICIAN:  Lionel December, M.D.    DATE OF BIRTH:  11-07-1980   DATE OF CONSULTATION:  05/02/2006  DATE OF DISCHARGE:                                   CONSULTATION   REASON FOR CONSULTATION:  Abdominal pain and thickened segment of mid small  bowel.   HISTORY OF PRESENT ILLNESS:  [name] is a 30 year old Caucasian female who  was in her usual state of health until the evening of March 30, 2006 when  around 10:00 p.m. she noted spasms or cramps in the periumbilical area.  She  used a heating pad and went to sleep.  However, she woke up around midnight  with diaphoresis, nausea, vomiting and excruciating pain.  She also felt  warm, although she did not check her temperature.  Her symptoms persisted.  The pain became unbearable, and finally she came to the emergency room  around 10:00 a.m. yesterday.  She was evaluated in the emergency room.  She  had lab studies and abdominopelvic CT which suggested enteritis.  The  patient was therefore hospitalized for further management.  She still  complains of mid abdominal pain.  It becomes bearable when she is given  medicine.  She does not have any appetite.  She did vomit while on the floor  yesterday.  There is no history of hematemesis, melena, rectal bleeding or  diarrhea.  She had normal formed stools while in emergency room yesterday.  While on the floor, she has been passing flatus.  There is no history of  recent travel outside the country.  She drinks city water.   The patient does not take any prescription or OTC meds or herbs.   PRESENT MEDICATIONS:  1. Lovenox 40 mg subcutaneous q.24h.  2. Levaquin 500 mg IV q.24h.  3. Flagyl 500 mg IV q.6h.  4. Protonix 40 mg IV daily.  5. Morphine sulfate 1-2 mg IV q.4h.  6. Zofran 4 mg IV q.6h. p.r.n.   PAST MEDICAL HISTORY:  She was treated with Zithromax for an STD about 3  months ago with successful eradication.  Negative for any other medical  problems.  She had a C-section in 2003.   ALLERGIES:  No known drug allergies.   FAMILY HISTORY:  Both parents are in fair to good health.  Two brothers in  very good health.   SOCIAL HISTORY:  She is single.  She has a 5-year-old son.  She has never  smoked cigarettes.  She drinks alcohol maybe once or twice a month.  She  works at the [employer] in __________ or [employer].   PHYSICAL EXAMINATION:  GENERAL:  A pleasant, well-developed, mildly obese  Caucasian female who appears to be in some distress.  VITAL SIGNS:  She weighs 177.4 pounds.  She 62 inches tall.  Pulse 80 per  minute, blood pressure 92/55, respirations 16 and temperature of 98.5.  HEENT:  Conjunctivae is pink.  Sclerae is nonicteric.  Oropharyngeal mucosa  is normal.  She has some acneiform type of rash over her face.  NECK:  No neck masses or thyromegaly noted.  CARDIAC EXAM:  Regular rhythm.  Normal S1 and S3.  No murmur or gallop  noted.  LUNGS:  Clear to auscultation.  ABDOMEN:  Symmetrical.  Bowel sounds are normal.  Palpation reveals a soft  abdomen with generalized tenderness which is moderate with some guarding in  the periumbilical area.  No thyromegaly or masses noted.  RECTAL:  Exam was deferred.  EXTREMITIES:  No clubbing or edema noted.   LABORATORY DATA:  Labs from admission revealed WBC 12.6, H&H 12.6 and 37.4,  platelet count is 201,000.  Differential reveals 70 segs and zero bands.  Sodium was 137, potassium 4.0, chloride 106, CO2 of 25, glucose 88, BUN 7,  creatinine 0.6, bilirubin 0.5, AP 82, AST 12, ALT 12, total protein 6.4 with  albumin of 3.2 and calcium of 8.7.  Amylase 48, lipase 20.  Beta HCG was  negative.  Urinalysis revealed normal specific gravity 1.025.  Her labs from  this morning revealed a WBC of 5.8, H&H of 10.1 and 29.1.  She  has a  platelet count of 226,000.  She has 68 segs.   Anemia profile, as well as stool studies, are pending.  However, they have  not been collected since she has not had a bowel movement.   Abdominopelvic CT reviewed.  She has thickening to the long segment of small  bowel which appears to be mid section.  There is very enteric edema;  however, there is no ascites, pneumoperitoneum.  The small bowel above this  segment does not appear to be dilated.  The distal small bowel on this study  appears to be normal.   ASSESSMENT:  [name] is a 30 year old Caucasian female who presents with  acute onset of midabdominal pain with nausea and vomiting who is noted to  have segmental enteritis.  There is no history of diarrhea, melena or rectal  bleeding.  The patient does not smoke cigarettes and no history of use of  contraceptives.  Therefore, she is at low risk for thromboembolic injury to  her small bowel.  I suspect we are dealing with an infectious or toxic  process.  Crohn's disease would be less likely, but cannot be completely  ruled out, even though the distal segment appears to be uninvolved in this  process.  Similarly, a mechanical process, i.e. closed loop obstruction or  early internal herniation with spontaneous __________ cannot be ruled out.  I agree with empiric therapy with Levaquin and metronidazole while  evaluation is underway.   RECOMMENDATIONS:  1. Will continue with Levaquin and Flagyl.  2. Stool studies as ordered.  3. She is to have a CBC and MET-7 in the a.m.   If the patient shows signs and symptoms of progressive disease with clinical  course, she may need laparotomy.  If she continues to improve with this  therapy, will consider small-bowel follow-through at a later or prior to  discharge.   A trial of Levsin SL t.i.d. might help with the cramps.   We would like to thank Dr. Catalina Pizza for the opportunity to participate in the care of this nice  lady.      Lionel December, M.D.  Electronically Signed     NR/MEDQ  D:  05/02/2006  T:  05/03/2006  Job:  [id number]

## 2010-10-31 NOTE — H&P (Signed)
NAME:  [name], [name]               ACCOUNT NO.:  [id number]   MEDICAL RECORD NO.:  [id number]          PATIENT TYPE:  AMB   LOCATION:                                FACILITY:  [facility]   PHYSICIAN:  Dalia Heading, M.D.  DATE OF BIRTH:  January 05, 1981   DATE OF ADMISSION:  DATE OF DISCHARGE:  LH                                HISTORY & PHYSICAL   CHIEF COMPLAINT:  Right breast mass.   HISTORY OF PRESENT ILLNESS:  The patient is a 30 year old [place] female who  is referred for evaluation and treatment of a right breast mass.  It has  been present for sometime, but seems to be increasing in size.  Varies with  her menstrual cycle.  No immediate family history of breast carcinoma or  nipple discharge is noted.   PAST MEDICAL HISTORY:  Unremarkable.   PAST SURGICAL HISTORY:  Cesarean section.   CURRENT MEDICATIONS:  None.   ALLERGIES:  No known drug allergies.   REVIEW OF SYSTEMS:  Noncontributory.   PHYSICAL EXAMINATION:  GENERAL:  The patient is a well-developed, well-  nourished [place] female in no acute distress.  VITAL SIGNS:  She is afebrile and vital signs are stable.  NECK:  Supple without lymphadenopathy.  LUNGS:  Clear to auscultation with equal breath sounds bilaterally.  HEART:  Regular rate and rhythm without S3, S4, or murmurs.  BREASTS:  Right breast examination reveals a dominant 2 cm, ovoid mass,  noted at the 12 o'clock position.  No nipple discharge or dimpling is noted.  The axilla is negative for palpable nodes.  Left breast examination reveals  no dominant mass, nipple discharge or dimpling.  The axilla is negative for  palpable nodes.   Mammogram and ultrasound reports reveal a dominant mass in the right breast.   IMPRESSION:  Right breast neoplasm, unknown behavior.   PLAN:  The patient is scheduled for right partial mastectomy on March 04, 2005.  The risks and benefits of the procedure including bleeding,  infection, and possibly malignancy were fully  explained to the patient who  gave informed consent.      Dalia Heading, M.D.  Electronically Signed     MAJ/MEDQ  D:  02/26/2005  T:  02/27/2005  Job:  [id number]   cc:   [facility]  Fax: [phone]

## 2010-10-31 NOTE — Discharge Summary (Signed)
[facility]  Patient:    [name], [name] Visit Number: [id number] MRN: [id number]          Service Type: OBS Location: 4A A414 01 Attending Physician:  Tilda Burrow Dictated by:   Langley Gauss, M.D. Admit Date:  08/07/2001 Discharge Date: 08/07/2001   CC:         Christin Bach, M.D.  [facility]   Discharge Summary  HISTORY OF PRESENT ILLNESS:  The patient is a 30 year old patient at about [redacted] weeks gestation, receiving prenatal care at the office of Dr. Christin Bach, who presents to [facility] at 0235 on August 07, 2001 complaining of nausea of recent onset as well as some stomach cramping.  The patient gives a history that she recently ate some outdated canned fruit which had an expiration date of January 15, 2000; however, she had also eaten a Museum/gallery exhibitions officer and french fries at [employer] in [place], [place] the p.m. prior to beginning to feel sick.  The patients prenatal course has been complicated by previous treatment for hyperemesis.  The patient has been treated with Zofran on an outpatient basis.  She did well on this.  No other hospitalizations recently. With this episode she took the p.o. Zofran with no relief of her nausea and vomiting.  PHYSICAL EXAMINATION:  GENERAL:  She is noted to be in no acute distress.  Actually, just prior to discharge she was noted to be sitting upright in bed, eating a [employer] sub with a good appetite, and no complaint of diarrhea.  VITAL SIGNS:  Blood pressure 105/70, pulse rate 80, respiratory rate 16, temperature 98.7 degrees.  ABDOMEN:  Soft, nontender.  Fundal height consistent with [redacted] week gestation. No uterine tenderness elicited.  PELVIC:  No vaginal bleeding, no vaginal discharge identified.  LABORATORY DATA:  Clean catch UA is pertinent for many bacteria, 11-20 urine wbc/hpf, few epithelial cells; positive leukocyte esterase; negative urine nitrite; negative ketones;  negative glucose.  ASSESSMENT:  First trimester pregnancy, [redacted] weeks gestation, with significant nausea and abdominal cramping - likely related to high fat content intake of [employer] cheeseburger and french fries.  The patient also, although asymptomatic, does have clean catch findings which are consistent with urinary tract infection.  HOSPITAL COURSE:  The patient was placed in the hospital on observation status.  She was treated with D-5 LR throughout the evening at 125 cc an hour; she will receive a total of 1 liter of fluids.  She also was treated with 1 g of IV Rocephin for the presumed urinary tract infection.  When I evaluated her the a.m. of August 07, 2001 I was unable to auscultate fetal heart tones utilizing the Doppler.  Thus, a limited transabdominal ultrasound was performed at the bedside by Dr. Roylene Reason. Lisette Grinder, noting a single intrauterine pregnancy with normal amniotic fluid volume surrounding the infant.  Fetal cardiac activity is identified within the 150s and fetal movement identified.  The fetal size is consistent with [redacted] week gestation. The patient did well on the regimen of 1 g IV Rocephin.  She also received some Zofran as well as some IM Phenergan for relief of her nausea symptoms. At the time of discharge she is in excellent condition, tolerating a regular diet.  DIAGNOSES:  1. Twelve week intrauterine pregnancy with nausea and inability to tolerate     p.o. intake.  2. Urinary tract infection with culture pending.  PROCEDURES:  Limited obstetrical ultrasound [redacted] weeks gestation, confirming and revealing  a viable intrauterine pregnancy.  DISPOSITION:  The patient is discharged home at this time.  DISCHARGE MEDICATIONS:  Macrodantin 100 mg p.o. b.i.d. x7 days. Dictated by:   Langley Gauss, M.D. Attending Physician:  Tilda Burrow DD:  08/07/01 TD:  08/08/01 Job: [id number]

## 2010-10-31 NOTE — H&P (Signed)
NAME:  [name], [name]               ACCOUNT NO.:  [id number]   MEDICAL RECORD NO.:  [id number]          PATIENT TYPE:  AMB   LOCATION:  DAY                           FACILITY:  [facility]   PHYSICIAN:  Dalia Heading, M.D.  DATE OF BIRTH:  March 06, 1981   DATE OF ADMISSION:  DATE OF DISCHARGE:  LH                              HISTORY & PHYSICAL   CHIEF COMPLAINT:  Right breast neoplasm.   HISTORY OF PRESENT ILLNESS:  The patient is a 29 year old, [name] female  who is referred for evaluation and treatment of a right breast mass.  It  has been present since 2006, but she did not undergo surgery due to her  inability to get healthcare coverage.  She thinks the mass has enlarged.  No nipple discharge or family history of breast carcinoma is noted.   PAST MEDICAL HISTORY:  Unremarkable.   PAST SURGICAL HISTORY:  Cesarean section.   CURRENT MEDICATIONS:  None.   ALLERGIES:  No known drug allergies.   REVIEW OF SYSTEMS:  __________   SOCIAL HISTORY:  The patient denies drinking or smoking.   PHYSICAL EXAMINATION:  GENERAL:  The patient is a well-developed, well-  nourished, [place] female in no acute distress.  NECK:  Supple without lymphadenopathy.  LUNGS:  Clear to auscultation with equal breath sounds bilaterally.  HEART:  Regular rate and rhythm without S3, S4, murmurs.  BREAST:  Right breast examination reveals a 2.5 cm dominant mass noted  in the upper, inner quadrant.  It is firm and mobile.  Increased density  of the surrounding tissue is noted.  No nipple discharge, or dimpling is  noted.  The axilla is negative for palpable nodes.  Left breast  examination reveals no dominant mass, nipple discharge, or dimpling.  The axilla is negative for palpable nodes.   IMPRESSION:  Right breast neoplasm.   PLAN:  The patient is scheduled for a right breast biopsy on September 20, 2006.  The risks and benefits of the procedure including bleeding and  infection were fully explained to the  patient, gave informed consent.      Dalia Heading, M.D.  Electronically Signed     MAJ/MEDQ  D:  09/16/2006  T:  09/16/2006  Job:  [id number]   cc:   [facility]  Fax: [phone]   Short Stay Unit

## 2010-11-04 ENCOUNTER — Ambulatory Visit (INDEPENDENT_AMBULATORY_CARE_PROVIDER_SITE_OTHER): Payer: BC Managed Care – PPO | Admitting: Cardiology

## 2010-11-04 ENCOUNTER — Encounter: Payer: Self-pay | Admitting: Cardiology

## 2010-11-04 DIAGNOSIS — R002 Palpitations: Secondary | ICD-10-CM

## 2010-11-04 DIAGNOSIS — R072 Precordial pain: Secondary | ICD-10-CM

## 2010-11-04 NOTE — Assessment & Plan Note (Signed)
No further symptoms off of medications. Previous monitor unremarkable. No further evaluation.

## 2010-11-04 NOTE — Patient Instructions (Signed)
Your physician recommends that you schedule a follow-up appointment in:  As needed with Dr. Crenshaw  

## 2010-11-04 NOTE — Assessment & Plan Note (Signed)
No further symptoms. No further evaluation.

## 2010-11-04 NOTE — Progress Notes (Signed)
HPI: Pleasant female I saw in Dec 2012 for evaluation of chest pain and palpitations. Holter monitor in November of 2011 revealed sinus to sinus tachycardia. TSH was normal. Patient previously placed on prednisone and claravis in Oct 2011. Chest pain and palpitations began after initiating those meds. Note she did have the palpitations with her monitor in place. Echocardiogram in January of 2012 showed normal LV function, mild mitral regurgitation and trace aortic insufficiency. Since I last saw her in Feb 2012, the patient denies any dyspnea on exertion, orthopnea, PND, pedal edema, palpitations, syncope or chest pain.    Current Outpatient Prescriptions  Medication Sig Dispense Refill  . diclofenac (VOLTAREN) 50 MG EC tablet Take 50 mg by mouth 3 (three) times daily.        Marland Kitchen omeprazole (PRILOSEC) 20 MG capsule Take 20 mg by mouth daily.        Marland Kitchen DISCONTD: clonazePAM (KLONOPIN) 0.5 MG tablet Take 0.5 mg by mouth at bedtime.        Marland Kitchen DISCONTD: cyclobenzaprine (FLEXERIL) 10 MG tablet Take 10 mg by mouth 3 (three) times daily as needed.        Marland Kitchen DISCONTD: ISOtretinoin (ACCUTANE) 40 MG capsule Take 40 mg by mouth daily. One and one half tablet daily       . DISCONTD: predniSONE (DELTASONE) 1 MG tablet Take 1 mg by mouth daily. Up to 9 mg daily          Past Medical History  Diagnosis Date  . Migraine headache   . IBS (irritable bowel syndrome)   . Pulmonary nodule     previous evaluation - benign  . Allergic rhinitis     Past Surgical History  Procedure Date  . Breast lumpectomy 2008    right breast - benign; needle - localized excision of right breast mass x2  . Caesarean section     History   Social History  . Marital Status: Single    Spouse Name: N/A    Number of Children: N/A  . Years of Education: N/A   Occupational History  . Manager     at [employer]   Social History Main Topics  . Smoking status: Never Smoker   . Smokeless tobacco: Not on file  .  Alcohol Use: Yes     rarely  . Drug Use:   . Sexually Active:    Other Topics Concern  . Not on file   Social History Narrative  . No narrative on file    ROS: no fevers or chills, productive cough, hemoptysis, dysphasia, odynophagia, melena, hematochezia, dysuria, hematuria, rash, seizure activity, orthopnea, PND, pedal edema, claudication. Remaining systems are negative.  Physical Exam: Well-developed well-nourished in no acute distress.  Skin is warm and dry.  HEENT is normal.  Neck is supple. No thyromegaly.  Chest is clear to auscultation with normal expansion.  Cardiovascular exam is regular rate and rhythm.  Abdominal exam nontender or distended. No masses palpated. Extremities show no edema. neuro grossly intact

## 2011-02-09 ENCOUNTER — Emergency Department (HOSPITAL_COMMUNITY)
Admission: EM | Admit: 2011-02-09 | Discharge: 2011-02-09 | Disposition: A | Discharge status: Home / Self Care | Payer: BC Managed Care – PPO | Attending: Emergency Medicine | Admitting: Emergency Medicine

## 2011-02-09 DIAGNOSIS — R209 Unspecified disturbances of skin sensation: Secondary | ICD-10-CM | POA: Insufficient documentation

## 2011-02-09 DIAGNOSIS — Z85820 Personal history of malignant melanoma of skin: Secondary | ICD-10-CM | POA: Insufficient documentation

## 2011-02-09 DIAGNOSIS — R110 Nausea: Secondary | ICD-10-CM | POA: Insufficient documentation

## 2011-02-09 LAB — URINALYSIS, ROUTINE W REFLEX MICROSCOPIC
Glucose, UA: NEGATIVE mg/dL
Hgb urine dipstick: NEGATIVE
Ketones, ur: NEGATIVE mg/dL
Protein, ur: NEGATIVE mg/dL

## 2011-02-11 ENCOUNTER — Ambulatory Visit
Admission: RE | Admit: 2011-02-11 | Discharge: 2011-02-11 | Disposition: A | Discharge status: Home / Self Care | Payer: BC Managed Care – PPO | Source: Ambulatory Visit | Attending: Internal Medicine | Admitting: Internal Medicine

## 2011-02-11 ENCOUNTER — Other Ambulatory Visit: Payer: Self-pay | Admitting: Internal Medicine

## 2011-02-11 DIAGNOSIS — R42 Dizziness and giddiness: Secondary | ICD-10-CM

## 2011-02-11 DIAGNOSIS — R51 Headache: Secondary | ICD-10-CM

## 2011-03-10 LAB — POCT PREGNANCY, URINE: Operator id: 151321

## 2011-03-10 LAB — COMPREHENSIVE METABOLIC PANEL
ALT: 15
AST: 14
Albumin: 3.7
Alkaline Phosphatase: 75
BUN: 5 — ABNORMAL LOW
CO2: 25
Calcium: 8.7
Chloride: 108
Creatinine, Ser: 0.69
GFR calc Af Amer: >60
GFR calc non Af Amer: >60
Glucose, Bld: 100 — ABNORMAL HIGH
Potassium: 3.5
Sodium: 140
Total Bilirubin: 0.4
Total Protein: 7.1

## 2011-03-10 LAB — CK: Total CK: 59

## 2011-03-10 LAB — DIFFERENTIAL
Basophils Absolute: 0
Basophils Relative: 0
Eosinophils Absolute: 0.1
Eosinophils Relative: 1
Lymphocytes Relative: 29
Lymphs Abs: 2.8
Monocytes Absolute: 0.7
Monocytes Relative: 7
Neutro Abs: 6.1
Neutrophils Relative %: 63

## 2011-03-10 LAB — CBC
HCT: 35.6 — ABNORMAL LOW
Hemoglobin: 11.9 — ABNORMAL LOW
MCHC: 33.4
MCV: 85.3
Platelets: 239
RBC: 4.17
RDW: 13.1
WBC: 9.7

## 2011-03-10 LAB — URINALYSIS, ROUTINE W REFLEX MICROSCOPIC
Bilirubin Urine: NEGATIVE
Glucose, UA: 100 — AB
Hgb urine dipstick: NEGATIVE
Ketones, ur: 15 — AB
Nitrite: NEGATIVE
Protein, ur: NEGATIVE
Specific Gravity, Urine: 1.01
Urobilinogen, UA: 1
pH: 6.5

## 2011-03-10 LAB — RAPID URINE DRUG SCREEN, HOSP PERFORMED
Amphetamines: NOT DETECTED
Barbiturates: NOT DETECTED
Benzodiazepines: NOT DETECTED
Cocaine: NOT DETECTED
Opiates: NOT DETECTED
Tetrahydrocannabinol: NOT DETECTED

## 2011-03-10 LAB — URINE MICROSCOPIC-ADD ON

## 2011-03-21 ENCOUNTER — Emergency Department (HOSPITAL_COMMUNITY): Payer: BC Managed Care – PPO

## 2011-03-21 ENCOUNTER — Emergency Department (HOSPITAL_COMMUNITY)
Admission: EM | Admit: 2011-03-21 | Discharge: 2011-03-21 | Disposition: A | Discharge status: Home / Self Care | Payer: BC Managed Care – PPO | Attending: Emergency Medicine | Admitting: Emergency Medicine

## 2011-03-21 DIAGNOSIS — R0789 Other chest pain: Secondary | ICD-10-CM | POA: Insufficient documentation

## 2011-03-21 DIAGNOSIS — E876 Hypokalemia: Secondary | ICD-10-CM | POA: Insufficient documentation

## 2011-03-21 DIAGNOSIS — IMO0001 Reserved for inherently not codable concepts without codable children: Secondary | ICD-10-CM | POA: Insufficient documentation

## 2011-03-21 LAB — CBC
HCT: 37.2 % (ref 36.0–46.0)
MCH: 27.7 pg (ref 26.0–34.0)
MCV: 82.5 fL (ref 78.0–100.0)
RBC: 4.51 MIL/uL (ref 3.87–5.11)
WBC: 10.9 10*3/uL — ABNORMAL HIGH (ref 4.0–10.5)

## 2011-03-21 LAB — DIFFERENTIAL
Basophils Relative: 0 % (ref 0–1)
Lymphocytes Relative: 27 % (ref 12–46)
Lymphs Abs: 2.9 10*3/uL (ref 0.7–4.0)
Monocytes Relative: 6 % (ref 3–12)
Neutro Abs: 7.2 10*3/uL (ref 1.7–7.7)

## 2011-03-21 LAB — POCT I-STAT TROPONIN I: Troponin i, poc: 0 ng/mL (ref 0.00–0.08)

## 2011-03-21 LAB — BASIC METABOLIC PANEL
BUN: 9 mg/dL (ref 6–23)
CO2: 26 mEq/L (ref 19–32)
Calcium: 9.6 mg/dL (ref 8.4–10.5)
Chloride: 100 mEq/L (ref 96–112)
Creatinine, Ser: 0.61 mg/dL (ref 0.50–1.10)
Glucose, Bld: 90 mg/dL (ref 70–99)

## 2011-04-07 ENCOUNTER — Other Ambulatory Visit: Payer: Self-pay | Admitting: Nurse Practitioner

## 2011-04-07 ENCOUNTER — Other Ambulatory Visit (HOSPITAL_COMMUNITY)
Admission: RE | Admit: 2011-04-07 | Discharge: 2011-04-07 | Disposition: A | Discharge status: Home / Self Care | Payer: BC Managed Care – PPO | Source: Ambulatory Visit | Attending: Obstetrics and Gynecology | Admitting: Obstetrics and Gynecology

## 2011-04-07 DIAGNOSIS — Z01419 Encounter for gynecological examination (general) (routine) without abnormal findings: Secondary | ICD-10-CM | POA: Insufficient documentation

## 2011-04-07 DIAGNOSIS — Z1159 Encounter for screening for other viral diseases: Secondary | ICD-10-CM | POA: Insufficient documentation

## 2011-04-09 ENCOUNTER — Other Ambulatory Visit: Payer: Self-pay | Admitting: Internal Medicine

## 2011-04-09 ENCOUNTER — Other Ambulatory Visit (HOSPITAL_COMMUNITY)
Admission: RE | Admit: 2011-04-09 | Discharge: 2011-04-09 | Disposition: A | Discharge status: Home / Self Care | Payer: BC Managed Care – PPO | Source: Ambulatory Visit | Attending: Internal Medicine | Admitting: Internal Medicine

## 2011-04-09 DIAGNOSIS — Z1159 Encounter for screening for other viral diseases: Secondary | ICD-10-CM | POA: Insufficient documentation

## 2011-04-09 DIAGNOSIS — Z01419 Encounter for gynecological examination (general) (routine) without abnormal findings: Secondary | ICD-10-CM | POA: Insufficient documentation

## 2011-04-19 ENCOUNTER — Inpatient Hospital Stay (HOSPITAL_COMMUNITY)
Admission: AD | Admit: 2011-04-19 | Discharge: 2011-04-19 | Disposition: A | Discharge status: Home / Self Care | Payer: BC Managed Care – PPO | Source: Ambulatory Visit | Attending: Obstetrics and Gynecology | Admitting: Obstetrics and Gynecology

## 2011-04-19 ENCOUNTER — Encounter (HOSPITAL_COMMUNITY): Payer: Self-pay | Admitting: *Deleted

## 2011-04-19 ENCOUNTER — Inpatient Hospital Stay (HOSPITAL_COMMUNITY): Payer: BC Managed Care – PPO

## 2011-04-19 DIAGNOSIS — N939 Abnormal uterine and vaginal bleeding, unspecified: Secondary | ICD-10-CM

## 2011-04-19 DIAGNOSIS — N938 Other specified abnormal uterine and vaginal bleeding: Secondary | ICD-10-CM | POA: Insufficient documentation

## 2011-04-19 DIAGNOSIS — N949 Unspecified condition associated with female genital organs and menstrual cycle: Secondary | ICD-10-CM | POA: Insufficient documentation

## 2011-04-19 HISTORY — DX: Polyp of cervix uteri: N84.1

## 2011-04-19 HISTORY — DX: Unspecified intestinal obstruction, unspecified as to partial versus complete obstruction: K56.609

## 2011-04-19 HISTORY — DX: Sepsis, unspecified organism: A41.9

## 2011-04-19 HISTORY — DX: Supervision of pregnancy with other poor reproductive or obstetric history, unspecified trimester: O09.299

## 2011-04-19 HISTORY — DX: Excessive and frequent menstruation with regular cycle: N92.0

## 2011-04-19 HISTORY — DX: Absence epileptic syndrome, not intractable, without status epilepticus: G40.A09

## 2011-04-19 HISTORY — DX: Unspecified lump in unspecified breast: N63.0

## 2011-04-19 HISTORY — DX: Unspecified staphylococcus as the cause of diseases classified elsewhere: B95.8

## 2011-04-19 HISTORY — DX: Calculus of kidney: N20.0

## 2011-04-19 HISTORY — DX: Malignant melanoma of unspecified lower limb, including hip: C43.70

## 2011-04-19 HISTORY — DX: Other acne: L70.8

## 2011-04-19 HISTORY — DX: Epilepsy, unspecified, not intractable, without status epilepticus: G40.909

## 2011-04-19 HISTORY — DX: Cerebral cysts: G93.0

## 2011-04-19 HISTORY — DX: Unspecified abnormal cytological findings in specimens from cervix uteri: R87.619

## 2011-04-19 LAB — CBC
HCT: 31.9 % — ABNORMAL LOW (ref 36.0–46.0)
MCH: 27.8 pg (ref 26.0–34.0)
MCHC: 32.6 g/dL (ref 30.0–36.0)
MCV: 85.3 fL (ref 78.0–100.0)
Platelets: 306 10*3/uL (ref 150–400)
RDW: 13.3 % (ref 11.5–15.5)

## 2011-04-19 NOTE — H&P (Signed)
Admission H&P:  MAU Visit  Patient is a 30 y.o. [id number] with a complicated medical history who presents today with a chief complaint of heavy vaginal bleeding.  Patient called earlier stating that she was passing baseball-sized clots on a regular basis, approximately every couple of hours.  Patient also reported abdominal pain.  She was instructed to come into [facility].  Patient saw her PCP several weeks ago and due to complicated medical issues, was recommended to have her Mirena IUD removed.  This was done and since then, the patient has complained of heavy vaginal bleeding.  On Friday, she was seen in the clinic by Dr. Dion Body who started her on tranexamic acid 6x/day.  Pt. States that she has noted no improvement in her bleeding.  Abdominal pain is generalized.  No n/v/f/c/diarrhea/constipation.  No urinary complaints.  Patient feels that pain is not similar to period cramping.  Regular bowel movements.  Patient overall is a poor historian and has much difficulty recollecting her medical problems.  Hgb in clinic on Friday was 11.6.  Today, it is 10.4.    PMH:  Epilepsy, acne fulminans, kidney stones, ? Melanoma, breast cysts/masses, ? Cyst on brainstem, recent pneumonia, h/o of tachycardia, unknown etiology. PSH:  C/s x1.   POB:  G2P1101, SVD x1, c/s x1. PGYN:  No abnormal paps or STD's.   Meds:  Lyrica 75 mg BID, Keppra 500 mg BID, Humira, Zofran prn,  Allergies:  Sulfa, cymbalta, amitryptyline. Soc:  No a/t/d.  ROS:  Per HPI.    PE:    AFVSS Abd:  Generalized tenderness of lower abd.  No rebound or guarding. SSE:  Small dime-sized clot at os and small amount of blood in vault.  Once removed, several minutes of observation of cervix revealed no active bleeding.  Pressure on abdomen did not lead to any bleeding as well. SVE:  Uterus appears normal size.  No adnexal masses.  No CMT.  Pain is generalized and patient states that she has increased abdominal pain despite gentle palpation of  the vulva prior to SSE.    WBC:  10.6.  Hgb 10.4.  Plt:  Wnl.  A/P:  Abnormal uterine bleeding in patient who recently had mirena IUD removed.  Failed tranexamic acid therapy.  Pt. Discussed with Dr. Dion Body.  It appears as though patient's description of symptoms to both her and I is not commensurate with the patient's current clinical picture.  However, given decrease in Hgb, treatment is warranted.  I discussed options including ocp taper, IV estrogen, or D&C.  Patient's exam does not warrant exam at present so OCP taper was described as the most appropriate treatment.  I discussed the risks of ocp's including blood clots, stroke, heart attack.  Patient had previously been on ocp's and nuvaring.  Taper described; LoOvral TID x2d then BID x2d and then qd thereafter.  Patient and her physician's have been attempting to decrease amt of hormones delivered systemically.  However, given failed non-hormonal treatment, will proceed with this.  Dr. Dion Body agrees.  Zofran will be prescribed for nausea as well.  Patient to f/u this week with Dr. Dion Body.  D/W pharmacist who states no contraindications with OCP's given her other meds.

## 2011-04-19 NOTE — Progress Notes (Signed)
Had Mirena IUD removed on 10/23 has been bleeding ever since passing clots, having abdominal pain, saw MD on Friday was given medication to stop bleeding did not help.

## 2011-04-24 ENCOUNTER — Encounter (HOSPITAL_COMMUNITY): Payer: Self-pay | Admitting: *Deleted

## 2011-04-26 ENCOUNTER — Other Ambulatory Visit: Payer: Self-pay | Admitting: Obstetrics and Gynecology

## 2011-04-26 NOTE — H&P (Signed)
History of Present Illness  General:       30 y/o with h/o abnormal uterine bleeding since Mirena removed presents for f/u evaluation. Pt previously seen about a week ago due to a h/o hemorrhage. Pt was without active bleeding. She was prescibed Lysteda, which did not work at all per pt. She was seen in [facility] ER for bleeding and started on OCP taper, which seemed to work for 2 days then the bleeding resumed. Pt reports minimal bleeding today.      Per ER note, pt without active bleeding, Hg 10.8 (previously 11.4). Ultrasound showed a thickening of endometrium at the fundus only. Pt reports she has significant lower abdominal soreness. Denies fever/chills.      Pt discussed possible ablation and BTL with on call gynecologist in the ER. Pt has 3 children, not sure if they want more but had not considered permanent sterilization previously. Due to medical conditions, she is either not advised to have children while on meds vs. never get pregnant again. Pt is unclear.  Current medications. 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   Social History  General:  History of smoking cigarettes: Never smoked.  no Smoking.  Alcohol: occasionally.  Caffeine: yes, 1 serving daily.  no Recreational drug use.  no Exercise.  Occupation: Production designer, theatre/television/film at [employer].  Marital Status: married.  Children: one 21 year old son.    Gyn History  Periods : started bleeding heavy when the Mirena was removed on 04/07/11 by Kess.  LMP 04/09/11.  Denies H/O Birth control .  Last pap smear date at least 2 years.  Abnormal pap smear pt states yes and was treated with repeat paps.    OB History  Number of pregnancies 2.  Pregnancy # 1 stillborn at 36 weeks.  Pregnancy # 2 live birth, C-section, boy.    Allergies  sulfa  Amitriptyline HCl  Hydrochlorothiazide: hypokalemia   Hospitalization/Major Diagnostic Procedure  childbirth x 1    Review of Systems  See HPI.     Vital Signs  Wt 221, Ht 63, BMI 39.14, Pulse sitting 84, BP sitting 118/74.   Physical Examination  GENERAL:  Patient appears alert and oriented, coloring is normal.  General Appearance: well-appearing, well-developed, no acute distress.  Speech: clear.  LUNGS:  General clear bilaterally, no crackles, no wheezes.  HEART:  Heart sounds: normal, RRR.  FEMALE GENITOURINARY:  Cervix visualized, healthy appearing, no discharge, no lesions, no active bleeding.  Adnexa: no mass, non tender.  Uterus: normal size/shape/consistency, freely mobile, moderately tender.  Vagina: pink/moist mucosa, no lesions, min blood in vault. No clots.  Vulva: normal, no lesions, no skin discoloration.  Anus: no external hemosrhoids.  EXTREMITIES:  general no edema.    Assessments  1. Abnormal uterine bleeding - 626.9 (Primary)  Treatment  1. Abnormal uterine bleeding  Start Ibuprofen Tablet, 800 MG, 1 tablet, Orally, q 8 hours prn pain, 30 day(s), 30, Refills 1 Continue Cryselle-28 Tablet, 0.3-30  MG-MCG, 1 tablet, Orally, Once a day, 30 days, 1, Refills 2 LAB: CBC WITH DIFF   WBC 9.7 4.0-11.0 - K/uL   RBC 3.09 4.20-5.40 - M/uL L  HGB 8.8 12.0-16.0 - g/dL L  HCT 78.2 95.6-21.3 - % L  MCV 85.8 81.0-99.0 - fL   MCHC 33.2 32.0-36.0 - g/dL   RDW 08.6 57.8-46.9 - %   PLT 306 150-400 - K/uL   NEUT % 65.4 43.3-71.9 - %   LYMPH% 26.9 16.8-43.5 - %   MONO % 5.9 4.6-12.4 - %   EOS % 1.20 0.00-7.80 - %   BASO % 0.6 0.0-1.0 - %   NEUT # 6.3 1.9-7.2 - K/uL   LYMPH# 2.60 1.10-2.73 - K/uL   MONO # 0.6 0.3-0.8 - K/uL   EOS # 0.10 0.00-0.60 - K/uL   BASO # 0.10 0.00-0.10 - K/uL    Based on ultrasound findings, I am concerned that the bleeding may have been due to uterine perforation that was unmasked with removal. I recommend Hysteroscopy/D&C to evaluate uterus. Pt instructed to stay out of work today and tomorrow if bleeding increases.  Referral GE:[id number] [facility] - Gynecology Reason:Precert-Hyst D&C Abnormal uterine bleeding

## 2011-04-27 ENCOUNTER — Encounter (HOSPITAL_COMMUNITY): Payer: Self-pay | Admitting: *Deleted

## 2011-04-27 ENCOUNTER — Encounter (HOSPITAL_COMMUNITY): Payer: Self-pay | Admitting: Anesthesiology

## 2011-04-27 ENCOUNTER — Ambulatory Visit (HOSPITAL_COMMUNITY): Payer: BC Managed Care – PPO | Admitting: Anesthesiology

## 2011-04-27 ENCOUNTER — Encounter (HOSPITAL_COMMUNITY)
Admission: RE | Disposition: A | Discharge status: Home / Self Care | Payer: Self-pay | Source: Ambulatory Visit | Attending: Obstetrics and Gynecology

## 2011-04-27 ENCOUNTER — Ambulatory Visit (HOSPITAL_COMMUNITY)
Admission: RE | Admit: 2011-04-27 | Discharge: 2011-04-27 | Disposition: A | Discharge status: Home / Self Care | Payer: BC Managed Care – PPO | Source: Ambulatory Visit | Attending: Obstetrics and Gynecology | Admitting: Obstetrics and Gynecology

## 2011-04-27 ENCOUNTER — Other Ambulatory Visit: Payer: Self-pay | Admitting: Obstetrics and Gynecology

## 2011-04-27 DIAGNOSIS — N938 Other specified abnormal uterine and vaginal bleeding: Secondary | ICD-10-CM | POA: Insufficient documentation

## 2011-04-27 DIAGNOSIS — N949 Unspecified condition associated with female genital organs and menstrual cycle: Secondary | ICD-10-CM | POA: Insufficient documentation

## 2011-04-27 HISTORY — DX: Unspecified convulsions: R56.9

## 2011-04-27 HISTORY — DX: Fibromyalgia: M79.7

## 2011-04-27 HISTORY — DX: Shortness of breath: R06.02

## 2011-04-27 HISTORY — DX: Gastro-esophageal reflux disease without esophagitis: K21.9

## 2011-04-27 HISTORY — PX: HYSTEROSCOPY W/D&C: SHX1775

## 2011-04-27 LAB — ABO/RH: ABO/RH(D): A POS

## 2011-04-27 LAB — CBC
Hemoglobin: 9.6 g/dL — ABNORMAL LOW (ref 12.0–15.0)
Platelets: 339 10*3/uL (ref 150–400)
RBC: 3.47 MIL/uL — ABNORMAL LOW (ref 3.87–5.11)
WBC: 8.6 10*3/uL (ref 4.0–10.5)

## 2011-04-27 LAB — HCG, QUANTITATIVE, PREGNANCY: hCG, Beta Chain, Quant, S: <1 m[IU]/mL (ref <5)

## 2011-04-27 LAB — DIFFERENTIAL
Eosinophils Absolute: 0.2 10*3/uL (ref 0.0–0.7)
Lymphocytes Relative: 38 % (ref 12–46)
Lymphs Abs: 3.2 10*3/uL (ref 0.7–4.0)
Neutro Abs: 4.5 10*3/uL (ref 1.7–7.7)
Neutrophils Relative %: 52 % (ref 43–77)

## 2011-04-27 LAB — TYPE AND SCREEN
ABO/RH(D): A POS
Antibody Screen: NEGATIVE

## 2011-04-27 SURGERY — DILATATION AND CURETTAGE /HYSTEROSCOPY
Anesthesia: General | Site: Uterus | Wound class: Clean Contaminated

## 2011-04-27 MED ORDER — LACTATED RINGERS IV SOLN
INTRAVENOUS | Status: DC
  Administered 2011-04-27: 125 mL/h via INTRAVENOUS
  Administered 2011-04-27: 19:00:00 via INTRAVENOUS

## 2011-04-27 MED ORDER — PROPOFOL 10 MG/ML IV EMUL
INTRAVENOUS | Status: DC | PRN
  Administered 2011-04-27: 200 mg via INTRAVENOUS

## 2011-04-27 MED ORDER — FENTANYL CITRATE 0.05 MG/ML IJ SOLN
INTRAMUSCULAR | Status: AC
  Filled 2011-04-27: qty 6

## 2011-04-27 MED ORDER — MIDAZOLAM HCL 2 MG/2ML IJ SOLN
INTRAMUSCULAR | Status: AC
  Filled 2011-04-27: qty 2

## 2011-04-27 MED ORDER — LIDOCAINE HCL 2 % IJ SOLN
INTRAMUSCULAR | Status: DC | PRN
  Administered 2011-04-27: 1.5 mL

## 2011-04-27 MED ORDER — DEXAMETHASONE SODIUM PHOSPHATE 10 MG/ML IJ SOLN
INTRAMUSCULAR | Status: AC
  Filled 2011-04-27: qty 1

## 2011-04-27 MED ORDER — FENTANYL CITRATE 0.05 MG/ML IJ SOLN
INTRAMUSCULAR | Status: AC
  Administered 2011-04-27: 50 ug via INTRAVENOUS
  Filled 2011-04-27: qty 2

## 2011-04-27 MED ORDER — PROMETHAZINE HCL 25 MG/ML IJ SOLN
6.2500 mg | INTRAMUSCULAR | Status: DC | PRN
  Administered 2011-04-27: 12.5 mg via INTRAVENOUS

## 2011-04-27 MED ORDER — FENTANYL CITRATE 0.05 MG/ML IJ SOLN
25.0000 ug | INTRAMUSCULAR | Status: DC | PRN
  Administered 2011-04-27 (×4): 50 ug via INTRAVENOUS

## 2011-04-27 MED ORDER — FENTANYL CITRATE 0.05 MG/ML IJ SOLN
INTRAMUSCULAR | Status: AC
  Filled 2011-04-27: qty 2

## 2011-04-27 MED ORDER — PROMETHAZINE HCL 25 MG/ML IJ SOLN
INTRAMUSCULAR | Status: AC
  Filled 2011-04-27: qty 1

## 2011-04-27 MED ORDER — GLYCOPYRROLATE 0.2 MG/ML IJ SOLN
INTRAMUSCULAR | Status: DC | PRN
  Administered 2011-04-27: 0.2 mg via INTRAVENOUS

## 2011-04-27 MED ORDER — METHYLERGONOVINE MALEATE 0.2 MG PO TABS: 0.2000 mg | ORAL_TABLET | Freq: Four times a day (QID) | ORAL | Status: DC

## 2011-04-27 MED ORDER — FENTANYL CITRATE 0.05 MG/ML IJ SOLN
INTRAMUSCULAR | Status: DC | PRN
  Administered 2011-04-27 (×2): 100 ug via INTRAVENOUS

## 2011-04-27 MED ORDER — DEXAMETHASONE SODIUM PHOSPHATE 4 MG/ML IJ SOLN
INTRAMUSCULAR | Status: DC | PRN
  Administered 2011-04-27: 10 mg via INTRAVENOUS

## 2011-04-27 MED ORDER — GLYCINE 1.5 % IR SOLN
Status: DC | PRN
  Administered 2011-04-27: 3000 mL

## 2011-04-27 MED ORDER — ONDANSETRON HCL 4 MG/2ML IJ SOLN
INTRAMUSCULAR | Status: AC
  Filled 2011-04-27: qty 2

## 2011-04-27 MED ORDER — LIDOCAINE HCL (CARDIAC) 20 MG/ML IV SOLN
INTRAVENOUS | Status: AC
  Filled 2011-04-27: qty 5

## 2011-04-27 MED ORDER — KETOROLAC TROMETHAMINE 30 MG/ML IJ SOLN
INTRAMUSCULAR | Status: DC | PRN
  Administered 2011-04-27: 30 mg via INTRAVENOUS

## 2011-04-27 MED ORDER — OXYCODONE-ACETAMINOPHEN 5-325 MG PO TABS: 1.0000 | ORAL_TABLET | ORAL | Status: AC | PRN

## 2011-04-27 MED ORDER — HYDROMORPHONE HCL PF 1 MG/ML IJ SOLN
0.5000 mg | INTRAMUSCULAR | Status: AC
  Administered 2011-04-27 (×4): 0.5 mg via INTRAVENOUS

## 2011-04-27 MED ORDER — PROPOFOL 10 MG/ML IV EMUL
INTRAVENOUS | Status: AC
  Filled 2011-04-27: qty 20

## 2011-04-27 MED ORDER — HYDROMORPHONE HCL PF 1 MG/ML IJ SOLN
INTRAMUSCULAR | Status: AC
  Filled 2011-04-27: qty 1

## 2011-04-27 MED ORDER — GLYCOPYRROLATE 0.2 MG/ML IJ SOLN
INTRAMUSCULAR | Status: AC
  Filled 2011-04-27: qty 1

## 2011-04-27 MED ORDER — MIDAZOLAM HCL 5 MG/5ML IJ SOLN
INTRAMUSCULAR | Status: DC | PRN
  Administered 2011-04-27: 2 mg via INTRAVENOUS

## 2011-04-27 MED ORDER — CEFAZOLIN SODIUM 1-5 GM-% IV SOLN
INTRAVENOUS | Status: AC
  Administered 2011-04-27: 1 g via INTRAVENOUS
  Filled 2011-04-27: qty 50

## 2011-04-27 MED ORDER — CEFAZOLIN SODIUM 1-5 GM-% IV SOLN: 1.0000 g | INTRAVENOUS | Status: DC

## 2011-04-27 MED ORDER — LIDOCAINE HCL (CARDIAC) 20 MG/ML IV SOLN
INTRAVENOUS | Status: DC | PRN
  Administered 2011-04-27: 100 mg via INTRAVENOUS

## 2011-04-27 MED ORDER — OXYCODONE-ACETAMINOPHEN 5-325 MG PO TABS
ORAL_TABLET | ORAL | Status: AC
  Administered 2011-04-27: 1
  Filled 2011-04-27: qty 1

## 2011-04-27 MED ORDER — ONDANSETRON HCL 4 MG/2ML IJ SOLN
INTRAMUSCULAR | Status: DC | PRN
  Administered 2011-04-27: 4 mg via INTRAVENOUS

## 2011-04-27 SURGICAL SUPPLY — 13 items
CANISTER SUCTION 2500CC (MISCELLANEOUS) ×2 IMPLANT
CATH ROBINSON RED A/P 16FR (CATHETERS) ×2 IMPLANT
CLOTH BEACON ORANGE TIMEOUT ST (SAFETY) ×2 IMPLANT
CONTAINER PREFILL 10% NBF 60ML (FORM) ×4 IMPLANT
DILATOR CANAL MILEX (MISCELLANEOUS) IMPLANT
DRAPE UTILITY XL STRL (DRAPES) ×2 IMPLANT
GLOVE BIO SURGEON STRL SZ 6.5 (GLOVE) ×2 IMPLANT
GLOVE BIOGEL PI IND STRL 7.0 (GLOVE) ×2 IMPLANT
GLOVE BIOGEL PI INDICATOR 7.0 (GLOVE) ×2
GOWN PREVENTION PLUS LG XLONG (DISPOSABLE) ×4 IMPLANT
PACK HYSTEROSCOPY LF (CUSTOM PROCEDURE TRAY) ×2 IMPLANT
TOWEL OR 17X24 6PK STRL BLUE (TOWEL DISPOSABLE) ×4 IMPLANT
WATER STERILE IRR 1000ML POUR (IV SOLUTION) ×2 IMPLANT

## 2011-04-27 NOTE — Progress Notes (Signed)
Called to see patient regarding pain beyond what would be expected post D&C.  Nurse told me over phone that patient required several doses of fentanyl and this did not relieve pain to any significant degree.  Order per anesthesia to give dilaudid.  Nurse stated that pulse rate and bp were wnl.  Upon arriving in PACU, patient was lying on side and appeared uncomfortable.  She complained of 7/10 pain that she felt mostly in her back.  No nausea or vomiting.    AFVSS.  Pulse ranging from 69 to 94.  BP 100's-110's/50-60's. Abd:  + bs, normoactive.  + rebound, + guarding.   Pelvic:  SVE:  Uterus appropriately tender.  No Cervical motion tenderness.  Patient has no complaint when cervix and uterus is manipulated and moved during bimanual exam.  No blood on gloved hand.  Uterus appears appropriate size.    A/P:  Recently s/p Diag hysteroscopy and D&C.  Pain out of proportion to procedure done.  I have previously examined this patient and have found her pain to be a challenge to accurately determine.  For instance, on prior exam, examination of patient's vulva led to increased abdominal pain.  This clinical picture that patient demonstrates tonight is similarly confusing.  While she reacts to rebound, she demonstrates absolutely no cervical motion tenderness or other peritoneal signs on pelvic exam.  Patient's vitals remain stable.  However, given pain and exam, will continue to closely monitor in PACU.  Patient does seem to be doing better s/p first dose of dilaudid.  No complications noted on brief op note.  If pain controlled and other milestones met, consider d/c home per previous orders.

## 2011-04-27 NOTE — Anesthesia Preprocedure Evaluation (Signed)
Anesthesia Evaluation    Airway Mallampati: II      Dental   Pulmonary          Cardiovascular     Neuro/Psych Seizures -,     GI/Hepatic   Endo/Other  Morbid obesity  Renal/GU      Musculoskeletal   Abdominal   Peds  Hematology   Anesthesia Other Findings   Reproductive/Obstetrics                           Anesthesia Physical Anesthesia Plan  ASA: III  Anesthesia Plan: General   Post-op Pain Management:    Induction: Intravenous  Airway Management Planned: LMA  Additional Equipment:   Intra-op Plan:   Post-operative Plan:   Informed Consent: I have reviewed the patients History and Physical, chart, labs and discussed the procedure including the risks, benefits and alternatives for the proposed anesthesia with the patient or authorized representative who has indicated his/her understanding and acceptance.   Dental Advisory Given  Plan Discussed with: CRNA and Surgeon  Anesthesia Plan Comments: (  Discussed  general anesthesia, including possible nausea, instrumentation of airway, sore throat,pulmonary aspiration, etc. I asked if the were any outstanding questions, or  concerns before we proceeded. )        Anesthesia Quick Evaluation

## 2011-04-27 NOTE — Anesthesia Preprocedure Evaluation (Deleted)
Anesthesia Evaluation Anesthesia Physical Anesthesia Plan Anesthesia Quick Evaluation  

## 2011-04-27 NOTE — Anesthesia Procedure Notes (Signed)
Procedure Name: LMA Insertion Date/Time: 04/27/2011 6:21 PM Performed by: Karleen Dolphin Pre-anesthesia Checklist: Emergency Drugs available, Timeout performed, Suction available, Patient identified and Patient being monitored Patient Re-evaluated:Patient Re-evaluated prior to inductionOxygen Delivery Method: Circle System Utilized Preoxygenation: Pre-oxygenation with 100% oxygen Intubation Type: IV induction Ventilation: Mask ventilation without difficulty LMA: LMA inserted LMA Size: 4.0 Number of attempts: 1 Placement Confirmation: positive ETCO2 and breath sounds checked- equal and bilateral Tube secured with: Tape Dental Injury: Teeth and Oropharynx as per pre-operative assessment

## 2011-04-27 NOTE — H&P (Signed)
Bleeding has slowed overall but still having clots.  Still with soreness. Pt examined and R/B/A discussed. No changes.

## 2011-04-27 NOTE — Transfer of Care (Signed)
Immediate Anesthesia Transfer of Care Note  Patient: [name]  Procedure(s) Performed:  DILATATION AND CURETTAGE (D&C) /HYSTEROSCOPY  Patient Location: PACU  Anesthesia Type: General  Level of Consciousness: alert  and oriented  Airway & Oxygen Therapy: Patient Spontanous Breathing and Patient connected to nasal cannula oxygen  Post-op Assessment: Report given to PACU RN and Post -op Vital signs reviewed and stable  Post vital signs: stable  Complications: No apparent anesthesia complications

## 2011-04-27 NOTE — Op Note (Signed)
NAME:  [name], [name]               ACCOUNT NO.:  [id number]  MEDICAL RECORD NO.:  [id number]  LOCATION:  [facility]                          FACILITY:  [facility]  PHYSICIAN:  Pieter Partridge, MD   DATE OF BIRTH:  1980/10/14  DATE OF PROCEDURE:  04/27/2011 DATE OF DISCHARGE:                              OPERATIVE REPORT   PREOPERATIVE DIAGNOSIS:  Abnormal uterine bleeding.  POSTOPERATIVE DIAGNOSIS:  Abnormal uterine bleeding.  SURGERIES: 1. Hysteroscopy. 2. D and C.  SURGEON:  Pieter Partridge, MD  PHYSICIAN ASSISTANTS:  None.  ASSISTANT:  Pensions consultant.  ANESTHESIA:  LMA and local 2% lidocaine.  ESTIMATED BLOOD LOSS:  Minimal.  No blood administered.  I and O cath at the beginning of the procedure, 25 mL.  SOURCE OF SPECIMEN:  Endometrial curettings and endocervical curettings sent to Pathology.  COUNTS:  Correct.  The patient was transported to PACU in stable condition.  INDICATIONS:  [name] is a 30 year old who has a history of epilepsy and had a Mirena IUD in place.  Due to some complications with her medical conditions, it was recommended by primary care doctor to have the Mirena IUD removed to reduce hormones and potentially see if some of her issues would resolve.  The IUD was removed on April 07, 2011, without complication.  Shortly thereafter, the patient started having significantly heavy bleeding, flooding clots.  The patient's initial hemoglobin was 11.4 and after 1 trial of Lysteda that was not helpful and an OCP taper.  She still continued to bleed and her hemoglobin was 8.8.  On the day of the procedure, the bleeding had slowed from the initial episode, but she was still having a gush of blood occasionally, intermittent and passing clots.  The patient also complains of significant soreness in her uterus.  The patient was counseled on risks, benefits, and alternatives, and informed consent obtained.  PROCEDURE IN DETAIL:  She was taken to the operating room  with IV running.  She underwent LMA anesthesia without complication in the dorsal supine position.  She was then placed in a dorsal lithotomy position and prepped and draped in a normal sterile fashion.  A bimanual was done which confirmed an 8-10-week anteverted uterus.  A Graves speculum was then inserted.  The anterior lip of the cervix was injected with approximately 2 mL of 2% lidocaine and a single-tooth tenaculum was applied to the anterior lip of the cervix.  The cervix was then dilated up to a Pratt 18 easily.  Hysteroscope was then advanced which showed a very clean endometrium, 1 floating blood clot, but nothing adhered to the wall.  No signs of perforation.  The uterus was not assigned prior to the procedure because I thought that potentially there might be a perforation or some type of uterine abnormality prior to the curette.  Then, the working curette was then working curette was then used to sample the endocervical area and then a regular curette was advanced to the uterine fundus and sharp curettage of all 4 quadrants of the uterus was performed with minimal tissue return.  There was no abnormal bleeding.  Single-tooth tenaculum was removed. The tenaculum site was hemostatic.  All  instruments were removed from the vagina.  The patient was taken to the PACU in stable condition.  All instruments and sponge counts were correct x3.  The patient received Ancef prior to the procedure and SCDs were in place.     Pieter Partridge, MD     EBV/MEDQ  D:  04/27/2011  T:  04/27/2011  Job:  [id number]

## 2011-04-27 NOTE — Brief Op Note (Signed)
04/27/2011  6:15 PM  PATIENT:  [name]  30 y.o. female  PRE-OPERATIVE DIAGNOSIS:  Abnormal Uterine Bleeding  POST-OPERATIVE DIAGNOSIS:  Same  PROCEDURE:  Procedure(s): DILATATION AND CURETTAGE (D&C) /HYSTEROSCOPY  SURGEON:  Surgeon(s): Geryl Rankins, MD  PHYSICIAN ASSISTANT: None  ASSISTANTS: Technician   ANESTHESIA:   LMA, Local 2% Lidocaine  EBL:   Minimal  BLOOD ADMINISTERED:none  DRAINS: none   LOCAL MEDICATIONS USED:  Lidocaine  SPECIMEN:  Source of Specimen:  Endometrial currettings, endocervical currettings  DISPOSITION OF SPECIMEN:  PATHOLOGY  COUNTS:  YES  TOURNIQUET:  * No tourniquets in log *  DICTATION: .Other Dictation: Dictation Number [id number]  PLAN OF CARE: Discharge to home after PACU  PATIENT DISPOSITION:  PACU - hemodynamically stable.   Delay start of Pharmacological VTE agent (>24hrs) due to surgical blood loss or risk of bleeding:  Yes

## 2011-04-28 ENCOUNTER — Other Ambulatory Visit (HOSPITAL_COMMUNITY): Payer: BC Managed Care – PPO

## 2011-04-28 ENCOUNTER — Encounter (HOSPITAL_COMMUNITY): Payer: Self-pay | Admitting: Obstetrics and Gynecology

## 2011-04-28 NOTE — Anesthesia Postprocedure Evaluation (Signed)
  Anesthesia Post-op Note  Patient: [name]  Procedure(s) Performed:  DILATATION AND CURETTAGE (D&C) /HYSTEROSCOPY  Patient is awake and responsive. Pain and nausea are reasonably well controlled. Vital signs are stable and clinically acceptable. Oxygen saturation is clinically acceptable. There are no apparent anesthetic complications at this time. Patient is ready for discharge.

## 2011-09-11 ENCOUNTER — Emergency Department (HOSPITAL_COMMUNITY)
Admission: EM | Admit: 2011-09-11 | Discharge: 2011-09-11 | Disposition: A | Discharge status: Home / Self Care | Payer: BC Managed Care – PPO | Attending: Emergency Medicine | Admitting: Emergency Medicine

## 2011-09-11 ENCOUNTER — Emergency Department (HOSPITAL_COMMUNITY): Payer: BC Managed Care – PPO

## 2011-09-11 ENCOUNTER — Encounter (HOSPITAL_COMMUNITY): Payer: Self-pay

## 2011-09-11 DIAGNOSIS — Z79899 Other long term (current) drug therapy: Secondary | ICD-10-CM | POA: Insufficient documentation

## 2011-09-11 DIAGNOSIS — K219 Gastro-esophageal reflux disease without esophagitis: Secondary | ICD-10-CM | POA: Insufficient documentation

## 2011-09-11 DIAGNOSIS — Z9889 Other specified postprocedural states: Secondary | ICD-10-CM | POA: Insufficient documentation

## 2011-09-11 DIAGNOSIS — IMO0001 Reserved for inherently not codable concepts without codable children: Secondary | ICD-10-CM | POA: Insufficient documentation

## 2011-09-11 DIAGNOSIS — R569 Unspecified convulsions: Secondary | ICD-10-CM

## 2011-09-11 DIAGNOSIS — G40909 Epilepsy, unspecified, not intractable, without status epilepticus: Secondary | ICD-10-CM | POA: Insufficient documentation

## 2011-09-11 DIAGNOSIS — Z87442 Personal history of urinary calculi: Secondary | ICD-10-CM | POA: Insufficient documentation

## 2011-09-11 DIAGNOSIS — R209 Unspecified disturbances of skin sensation: Secondary | ICD-10-CM | POA: Insufficient documentation

## 2011-09-11 LAB — CBC
HCT: 32.5 % — ABNORMAL LOW (ref 36.0–46.0)
MCH: 26 pg (ref 26.0–34.0)
MCV: 79.7 fL (ref 78.0–100.0)
Platelets: 287 10*3/uL (ref 150–400)
RBC: 4.08 MIL/uL (ref 3.87–5.11)
WBC: 8.8 10*3/uL (ref 4.0–10.5)

## 2011-09-11 LAB — BASIC METABOLIC PANEL
BUN: 6 mg/dL (ref 6–23)
CO2: 29 mEq/L (ref 19–32)
Calcium: 9 mg/dL (ref 8.4–10.5)
Creatinine, Ser: 0.68 mg/dL (ref 0.50–1.10)
GFR calc non Af Amer: >90 mL/min (ref >90)
Glucose, Bld: 90 mg/dL (ref 70–99)
Sodium: 140 mEq/L (ref 135–145)

## 2011-09-11 LAB — DIFFERENTIAL
Eosinophils Absolute: 0.1 10*3/uL (ref 0.0–0.7)
Eosinophils Relative: 2 % (ref 0–5)
Lymphocytes Relative: 30 % (ref 12–46)
Lymphs Abs: 2.7 10*3/uL (ref 0.7–4.0)
Monocytes Absolute: 0.7 10*3/uL (ref 0.1–1.0)

## 2011-09-11 MED ORDER — LORAZEPAM 1 MG PO TABS
1.0000 mg | ORAL_TABLET | Freq: Once | ORAL | Status: AC
  Administered 2011-09-11: 1 mg via ORAL
  Filled 2011-09-11: qty 1

## 2011-09-11 NOTE — Discharge Instructions (Signed)
Driving and Equipment Restrictions Some medical problems make it dangerous to drive, ride a bike, or use machines. Some of these problems are:  A hard blow to the head (concussion).   Passing out (fainting).   Twitching and shaking (seizures).   Low blood sugar.   Taking medicine to help you relax (sedatives).   Taking pain medicines.   Wearing an eye patch.   Wearing splints. This can make it hard to use parts of your body that you need to drive safely.  HOME CARE   Do not drive until your doctor says it is okay.   Do not use machines until your doctor says it is okay.  You may need a form signed by your doctor (medical release) before you can drive again. You may also need this form before you do other tasks where you need to be fully alert. MAKE SURE YOU:  Understand these instructions.   Will watch your condition.   Will get help right away if you are not doing well or get worse.  Document Released: 07/09/2004 Document Revised: 05/21/2011 Document Reviewed: 10/09/2009 [facility] Patient Information 2012 [place], [place].    Called Dr. Anne Hahn Monday morning for followup. Rest this weekend. Decrease stress. Increase fluids. Take your normal medications.

## 2011-09-11 NOTE — ED Provider Notes (Signed)
This chart was scribed for [name] Hutching, MD by [name]. The patient was seen in room APA08/APA08 at 9:40 AM.  CSN: [id number]  Arrival date & time 09/11/11  0920   First MD Initiated Contact with Patient 09/11/11 [phone]      Chief Complaint  Patient presents with  . Seizures    (Consider location/radiation/quality/duration/timing/severity/associated sxs/prior treatment) Patient is a 31 y.o. female presenting with seizures. The history is provided by the patient.  Seizures  This is a chronic problem. The current episode started less than 1 hour ago. The problem has been resolved. There was 1 seizure. The seizures did not continue in the ED. There has been no fever.   [name] is a 31 y.o. female who presents to the Emergency Department complaining of an acute onset seizure this morning while at work. 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 First seizure in October of 2012. Taking Lyrica and Keppra as prescribed by PCP. 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 Pt is eating and drinking normally. Pt had a headache but felt fine otherwise this morning. Tingling sensation in arms and some knee pain from an unrelated injury, knee frequentlypops out of joint. 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 PCP-Dr. Anne Hahn  Past Medical History  Diagnosis Date  . Migraine headache   . IBS (irritable bowel syndrome)   . Pulmonary nodule     previous evaluation - benign  . Allergic rhinitis   . Previous stillbirth or demise, antepartum   . Kidney stones   . Melanoma of hip   . Staphylococcal infection   . Sepsis   . Epilepsy   . Acne fulminans   . Petit mal epilepsy   . Breast nodule   . Brain cyst   . Small bowel obstruction   . Cervical polyp   . Menorrhagia   . Abnormal Pap smear of cervix   . Seizures     recently diagnosed with epilepsy  . Shortness of breath     due to wt  . Fibromyalgia   . GERD (gastroesophageal reflux disease)     Past  Surgical History  Procedure Date  . Breast lumpectomy 2008    right breast - benign; needle - localized excision of right breast mass x2  . Caesarean section   . Melanoma excision   . Svd     x 1 - stillborn at 36 weeks  . Hysteroscopy w/d&c 04/27/2011    Procedure: DILATATION AND CURETTAGE (D&C) /HYSTEROSCOPY;  Surgeon: Geryl Rankins, MD;  Location: [facility];  Service: Gynecology;  Laterality: N/A;    Family History  Problem Relation Age of Onset  . Emphysema Paternal Grandmother   . Heart disease Paternal Grandmother   . Emphysema Paternal Grandfather   . Cancer Paternal Grandfather     throat cancer    History  Substance Use Topics  . Smoking status: Never Smoker   . Smokeless tobacco: Not on file  . Alcohol Use: No    OB History    Grav Para Term Preterm Abortions TAB SAB Ect Mult Living   2 2 2       1       Review of Systems  Neurological: Positive for seizures.  All other systems reviewed and are negative.    Allergies  Amitriptyline; Cymbalta; and Sulfonamide derivatives  Home Medications   Current Outpatient Rx  Name Route Sig Dispense Refill  . ADALIMUMAB 40  MG/0.8ML [place] KIT Subcutaneous Inject 40 mg into the skin every 14 (fourteen) days.      Marland Kitchen LEVETIRACETAM 500 MG PO TABS Oral Take 500 mg by mouth every 12 (twelve) hours.     . METHYLERGONOVINE MALEATE 0.2 MG PO TABS Oral Take 1 tablet (0.2 mg total) by mouth every 6 (six) hours. 4 tablet 0    Q 6 hours x 24 hours  . NAPROXEN SODIUM 220 MG PO TABS Oral Take 440 mg by mouth as needed. For pain     . OMEPRAZOLE 20 MG PO CPDR Oral Take 20 mg by mouth daily.      Marland Kitchen PREGABALIN 75 MG PO CAPS Oral Take 75 mg by mouth 2 (two) times daily.        Ht 5\' 2"  (1.575 m)  Wt 217 lb (98.431 kg)  BMI 39.69 kg/m2  LMP 07/13/2011  Physical Exam  Nursing note and vitals reviewed. Constitutional: She is oriented to person, place, and time. She appears well-developed and well-nourished.  HENT:  Head: Normocephalic  and atraumatic.  Neck: Normal range of motion. Neck supple.  Pulmonary/Chest: Effort normal and breath sounds normal.  Abdominal: There is no tenderness.  Neurological: She is alert and oriented to person, place, and time.  Skin: Skin is warm and dry.  Psychiatric: She has a normal mood and affect. Her behavior is normal.    ED Course  Procedures (including critical care time) DIAGNOSTIC STUDIES: Oxygen Saturation is 99% on room air;  normal by my interpretation.    COORDINATION OF CARE:  Medications  LORazepam (ATIVAN) tablet 1 mg (not administered)   Discussed medication effects with pt. Ativan ordered to help pt relax. 11:47 AM Consult to Dr. Anne Hahn., neurologist in [employer] Reviewed - No data to display No results found.  No diagnosis found. Dg Knee Complete 4 Views Left  09/11/2011  *RADIOLOGY REPORT*  Clinical Data: Knee pain.  Popping.  Seizure.  LEFT KNEE - COMPLETE 4+ VIEW  Comparison: None.  Findings: Anatomic alignment of the left knee.  No fracture. Lateral view is over penetrated.  There is no gross effusion. The patella appears appropriately located on frontal and lateral views.  IMPRESSION: No acute osseous abnormality.  Original Report Authenticated By: Andreas Newport, M.D.   Results for orders placed during the hospital encounter of 09/11/11  POCT PREGNANCY, URINE      Component Value Range   Preg Test, Ur NEGATIVE  NEGATIVE   CBC      Component Value Range   WBC 8.8  4.0 - 10.5 (K/uL)   RBC 4.08  3.87 - 5.11 (MIL/uL)   Hemoglobin 10.6 (*) 12.0 - 15.0 (g/dL)   HCT 78.2 (*) 95.6 - 46.0 (%)   MCV 79.7  78.0 - 100.0 (fL)   MCH 26.0  26.0 - 34.0 (pg)   MCHC 32.6  30.0 - 36.0 (g/dL)   RDW 21.3  08.6 - 57.8 (%)   Platelets 287  150 - 400 (K/uL)  DIFFERENTIAL      Component Value Range   Neutrophils Relative 60  43 - 77 (%)   Neutro Abs 5.3  1.7 - 7.7 (K/uL)   Lymphocytes Relative 30  12 - 46 (%)   Lymphs Abs 2.7  0.7 - 4.0 (K/uL)   Monocytes  Relative 7  3 - 12 (%)   Monocytes Absolute 0.7  0.1 - 1.0 (K/uL)   Eosinophils Relative 2  0 - 5 (%)   Eosinophils Absolute  0.1  0.0 - 0.7 (K/uL)   Basophils Relative 1  0 - 1 (%)   Basophils Absolute 0.0  0.0 - 0.1 (K/uL)  BASIC METABOLIC PANEL      Component Value Range   Sodium 140  135 - 145 (mEq/L)   Potassium 3.8  3.5 - 5.1 (mEq/L)   Chloride 103  96 - 112 (mEq/L)   CO2 29  19 - 32 (mEq/L)   Glucose, Bld 90  70 - 99 (mg/dL)   BUN 6  6 - 23 (mg/dL)   Creatinine, Ser 1.61  0.50 - 1.10 (mg/dL)   Calcium 9.0  8.4 - 09.6 (mg/dL)   GFR calc non Af Amer >90  >90 (mL/min)   GFR calc Af Amer >90  >90 (mL/min)   MDM  Patient has normal neuro exam.  Patient observed for 2-3 hours. Discussed test results with mother and patient. She will followup with neurologist next week.   I personally performed the services described in this documentation, which was scribed in my presence. The recorded information has been reviewed and considered.        [name] Hutching, MD 09/11/11 1421

## 2011-09-11 NOTE — ED Notes (Signed)
Pt states she had a seizure this morning. States when she has a seizure her left knee pops out of joint. Complain of pain in left knee.

## 2011-09-23 ENCOUNTER — Other Ambulatory Visit (HOSPITAL_COMMUNITY): Payer: Self-pay | Admitting: Internal Medicine

## 2011-09-23 ENCOUNTER — Other Ambulatory Visit: Payer: Self-pay | Admitting: Internal Medicine

## 2011-09-23 DIAGNOSIS — D249 Benign neoplasm of unspecified breast: Secondary | ICD-10-CM

## 2011-09-23 DIAGNOSIS — Z09 Encounter for follow-up examination after completed treatment for conditions other than malignant neoplasm: Secondary | ICD-10-CM

## 2011-10-01 ENCOUNTER — Ambulatory Visit
Admission: RE | Admit: 2011-10-01 | Discharge: 2011-10-01 | Disposition: A | Discharge status: Home / Self Care | Payer: BC Managed Care – PPO | Source: Ambulatory Visit | Attending: Internal Medicine | Admitting: Internal Medicine

## 2011-10-01 DIAGNOSIS — D249 Benign neoplasm of unspecified breast: Secondary | ICD-10-CM

## 2012-06-24 ENCOUNTER — Emergency Department (HOSPITAL_COMMUNITY): Payer: BC Managed Care – PPO

## 2012-06-24 ENCOUNTER — Encounter (HOSPITAL_COMMUNITY): Payer: Self-pay

## 2012-06-24 ENCOUNTER — Emergency Department (HOSPITAL_COMMUNITY)
Admission: EM | Admit: 2012-06-24 | Discharge: 2012-06-24 | Disposition: A | Discharge status: Home / Self Care | Payer: BC Managed Care – PPO | Attending: Emergency Medicine | Admitting: Emergency Medicine

## 2012-06-24 DIAGNOSIS — Z872 Personal history of diseases of the skin and subcutaneous tissue: Secondary | ICD-10-CM | POA: Insufficient documentation

## 2012-06-24 DIAGNOSIS — Z8742 Personal history of other diseases of the female genital tract: Secondary | ICD-10-CM | POA: Insufficient documentation

## 2012-06-24 DIAGNOSIS — Z86011 Personal history of benign neoplasm of the brain: Secondary | ICD-10-CM | POA: Insufficient documentation

## 2012-06-24 DIAGNOSIS — Z79899 Other long term (current) drug therapy: Secondary | ICD-10-CM | POA: Insufficient documentation

## 2012-06-24 DIAGNOSIS — Z87410 Personal history of cervical dysplasia: Secondary | ICD-10-CM | POA: Insufficient documentation

## 2012-06-24 DIAGNOSIS — R197 Diarrhea, unspecified: Secondary | ICD-10-CM | POA: Insufficient documentation

## 2012-06-24 DIAGNOSIS — G40909 Epilepsy, unspecified, not intractable, without status epilepticus: Secondary | ICD-10-CM | POA: Insufficient documentation

## 2012-06-24 DIAGNOSIS — Z8709 Personal history of other diseases of the respiratory system: Secondary | ICD-10-CM | POA: Insufficient documentation

## 2012-06-24 DIAGNOSIS — M549 Dorsalgia, unspecified: Secondary | ICD-10-CM | POA: Insufficient documentation

## 2012-06-24 DIAGNOSIS — Z8619 Personal history of other infectious and parasitic diseases: Secondary | ICD-10-CM | POA: Insufficient documentation

## 2012-06-24 DIAGNOSIS — K589 Irritable bowel syndrome without diarrhea: Secondary | ICD-10-CM | POA: Insufficient documentation

## 2012-06-24 DIAGNOSIS — Z87442 Personal history of urinary calculi: Secondary | ICD-10-CM | POA: Insufficient documentation

## 2012-06-24 DIAGNOSIS — R109 Unspecified abdominal pain: Secondary | ICD-10-CM | POA: Insufficient documentation

## 2012-06-24 DIAGNOSIS — R112 Nausea with vomiting, unspecified: Secondary | ICD-10-CM | POA: Insufficient documentation

## 2012-06-24 DIAGNOSIS — Z8719 Personal history of other diseases of the digestive system: Secondary | ICD-10-CM | POA: Insufficient documentation

## 2012-06-24 DIAGNOSIS — Z8589 Personal history of malignant neoplasm of other organs and systems: Secondary | ICD-10-CM | POA: Insufficient documentation

## 2012-06-24 DIAGNOSIS — Z8679 Personal history of other diseases of the circulatory system: Secondary | ICD-10-CM | POA: Insufficient documentation

## 2012-06-24 LAB — COMPREHENSIVE METABOLIC PANEL
BUN: 6 mg/dL (ref 6–23)
CO2: 28 mEq/L (ref 19–32)
Chloride: 102 mEq/L (ref 96–112)
Creatinine, Ser: 0.69 mg/dL (ref 0.50–1.10)
GFR calc Af Amer: >90 mL/min (ref >90)
GFR calc non Af Amer: >90 mL/min (ref >90)
Glucose, Bld: 83 mg/dL (ref 70–99)
Total Bilirubin: 0.2 mg/dL — ABNORMAL LOW (ref 0.3–1.2)

## 2012-06-24 LAB — CBC WITH DIFFERENTIAL/PLATELET
Basophils Relative: 0 % (ref 0–1)
HCT: 35 % — ABNORMAL LOW (ref 36.0–46.0)
Hemoglobin: 11.2 g/dL — ABNORMAL LOW (ref 12.0–15.0)
Lymphocytes Relative: 34 % (ref 12–46)
MCHC: 32 g/dL (ref 30.0–36.0)
Monocytes Absolute: 0.6 10*3/uL (ref 0.1–1.0)
Monocytes Relative: 7 % (ref 3–12)
Neutro Abs: 4.9 10*3/uL (ref 1.7–7.7)

## 2012-06-24 LAB — WET PREP, GENITAL: WBC, Wet Prep HPF POC: NONE SEEN

## 2012-06-24 LAB — URINALYSIS, ROUTINE W REFLEX MICROSCOPIC
Ketones, ur: NEGATIVE mg/dL
Leukocytes, UA: NEGATIVE
Nitrite: NEGATIVE
Protein, ur: NEGATIVE mg/dL

## 2012-06-24 LAB — POCT PREGNANCY, URINE: Preg Test, Ur: NEGATIVE

## 2012-06-24 LAB — LIPASE, BLOOD: Lipase: 20 U/L (ref 11–59)

## 2012-06-24 MED ORDER — HYDROMORPHONE HCL PF 1 MG/ML IJ SOLN
1.0000 mg | Freq: Once | INTRAMUSCULAR | Status: AC
  Administered 2012-06-24: 1 mg via INTRAVENOUS
  Filled 2012-06-24: qty 1

## 2012-06-24 MED ORDER — ONDANSETRON HCL 4 MG/2ML IJ SOLN
4.0000 mg | Freq: Once | INTRAMUSCULAR | Status: AC
  Administered 2012-06-24: 4 mg via INTRAVENOUS
  Filled 2012-06-24: qty 2

## 2012-06-24 MED ORDER — SODIUM CHLORIDE 0.9 % IV SOLN
INTRAVENOUS | Status: DC
  Administered 2012-06-24: 17:00:00 via INTRAVENOUS

## 2012-06-24 MED ORDER — PROMETHAZINE HCL 25 MG PO TABS: 25.0000 mg | ORAL_TABLET | ORAL | Status: DC | PRN

## 2012-06-24 MED ORDER — HYDROCODONE-ACETAMINOPHEN 5-325 MG PO TABS: 1.0000 | ORAL_TABLET | ORAL | Status: AC | PRN

## 2012-06-24 NOTE — ED Provider Notes (Signed)
History  This chart was scribed for Carleene Cooper III, MD by Ardeen Jourdain, ED Scribe. This patient was seen in room APA5/APA5 and the patient's care was started at 1603.  CSN: [id number]  Arrival date & time 06/24/12  1252   First MD Initiated Contact with Patient 06/24/12 1603      Chief Complaint  Patient presents with  . Abdominal Pain  . Back Pain     The history is provided by the patient. No language interpreter was used.    [name] is a 32 y.o. female who presents to the Emergency Department complaining of abdominal pain with associated nausea and back pain. She also is c/o diarrhea and decreased urination. She reports the gradually worsening, constant pain radiates into her upper back to her right shoulder. She states the pain was at its worst today. She states the nausea and emesis began 3 weeks ago, but she thought it was morning sickness. She denies any ear ache, sore throat, CP, cough, rash, seizure and syncope as associated symptoms. She states she had a positive pregnancy test in November but a negative result after following up with her ObGyn. She reports having a CT of her abdomen this morning at [facility], who reccommended she come to the ED and have a CT with dye. She is no longer getting treatment for any of her medical conditions other than epilepsy. Her LMP was at the end of November.    Past Medical History  Diagnosis Date  . Migraine headache   . IBS (irritable bowel syndrome)   . Pulmonary nodule     previous evaluation - benign  . Allergic rhinitis   . Previous stillbirth or demise, antepartum   . Kidney stones   . Melanoma of hip   . Staphylococcal infection   . Sepsis(995.91)   . Epilepsy   . Acne fulminans   . Petit mal epilepsy   . Breast nodule   . Brain cyst   . Small bowel obstruction   . Cervical polyp   . Menorrhagia   . Abnormal Pap smear of cervix   . Seizures     recently diagnosed with epilepsy  . Shortness of breath       due to wt  . Fibromyalgia   . GERD (gastroesophageal reflux disease)     Past Surgical History  Procedure Date  . Breast lumpectomy 2008    right breast - benign; needle - localized excision of right breast mass x2  . Caesarean section   . Melanoma excision   . Svd     x 1 - stillborn at 36 weeks  . Hysteroscopy w/d&c 04/27/2011    Procedure: DILATATION AND CURETTAGE (D&C) /HYSTEROSCOPY;  Surgeon: Geryl Rankins, MD;  Location: [facility];  Service: Gynecology;  Laterality: N/A;    Family History  Problem Relation Age of Onset  . Emphysema Paternal Grandmother   . Heart disease Paternal Grandmother   . Emphysema Paternal Grandfather   . Cancer Paternal Grandfather     throat cancer    History  Substance Use Topics  . Smoking status: Never Smoker   . Smokeless tobacco: Not on file  . Alcohol Use: No    OB History    Grav Para Term Preterm Abortions TAB SAB Ect Mult Living   2 2 2       1       Review of Systems  Constitutional: Negative for fever and chills.  HENT: Negative for congestion,  rhinorrhea and sneezing.   Eyes: Negative.   Respiratory: Negative for cough, chest tightness and shortness of breath.   Cardiovascular: Negative for chest pain.  Gastrointestinal: Negative for nausea, vomiting, abdominal pain and diarrhea.  Genitourinary: Positive for difficulty urinating. Negative for hematuria.  Musculoskeletal: Positive for back pain.  Skin: Negative for rash.  Neurological: Negative for seizures, syncope and weakness.  All other systems reviewed and are negative.      Allergies  Amitriptyline; Cymbalta; and Sulfonamide derivatives  Home Medications   Current Outpatient Rx  Name  Route  Sig  Dispense  Refill  . IBUPROFEN 800 MG PO TABS   Oral   Take 800 mg by mouth every 8 (eight) hours as needed. pain         . LEVETIRACETAM 500 MG PO TABS   Oral   Take 500 mg by mouth every 12 (twelve) hours.          Marland Kitchen NAPROXEN SODIUM 220 MG PO TABS    Oral   Take 440 mg by mouth as needed. For pain          . OMEPRAZOLE 20 MG PO CPDR   Oral   Take 20 mg by mouth daily.           Marland Kitchen PREGABALIN 150 MG PO CAPS   Oral   Take 150 mg by mouth 2 (two) times daily.         . TRAZODONE HCL 100 MG PO TABS   Oral   Take 100 mg by mouth at bedtime.           Triage Vitals: BP 112/63  Pulse 88  Temp 98.5 F (36.9 C) (Oral)  Resp 20  Ht 5\' 2"  (1.575 m)  Wt 220 lb (99.791 kg)  BMI 40.24 kg/m2  SpO2 98%  LMP 05/04/2012  Physical Exam  Nursing note and vitals reviewed. Constitutional: She is oriented to person, place, and time. She appears well-developed and well-nourished. No distress.  HENT:  Head: Normocephalic and atraumatic.  Right Ear: External ear normal.  Left Ear: External ear normal.  Mouth/Throat: Oropharynx is clear and moist. No oropharyngeal exudate.  Eyes: Conjunctivae normal and EOM are normal. Pupils are equal, round, and reactive to light.  Neck: Normal range of motion. Neck supple. No tracheal deviation present. No thyromegaly present.  Cardiovascular: Normal rate, regular rhythm and normal heart sounds.  Exam reveals no gallop and no friction rub.   No murmur heard. Pulmonary/Chest: Effort normal and breath sounds normal. No respiratory distress. She has no wheezes. She has no rales. She exhibits no tenderness.  Abdominal: Soft. Bowel sounds are normal. She exhibits no distension. There is tenderness. There is no rebound and no guarding.       Suprapubic tenderness  Musculoskeletal: Normal range of motion. She exhibits no edema and no tenderness.  Lymphadenopathy:    She has no cervical adenopathy.  Neurological: She is alert and oriented to person, place, and time.  Skin: Skin is warm and dry.  Psychiatric: She has a normal mood and affect. Her behavior is normal.    ED Course  Procedures (including critical care time)  DIAGNOSTIC STUDIES: Oxygen Saturation is 98% on room air, normal by my  interpretation.    COORDINATION OF CARE:  4:18 PM: Discussed treatment plan which includes UA and pain medication with pt at bedside and pt agreed to plan.   7:35 PM Results for orders placed during the hospital encounter of 06/24/12  CBC WITH DIFFERENTIAL      Component Value Range   WBC 8.6  4.0 - 10.5 K/uL   RBC 4.39  3.87 - 5.11 MIL/uL   Hemoglobin 11.2 (*) 12.0 - 15.0 g/dL   HCT 45.4 (*) 09.8 - 11.9 %   MCV 79.7  78.0 - 100.0 fL   MCH 25.5 (*) 26.0 - 34.0 pg   MCHC 32.0  30.0 - 36.0 g/dL   RDW 14.7  82.9 - 56.2 %   Platelets 315  150 - 400 K/uL   Neutrophils Relative 57  43 - 77 %   Neutro Abs 4.9  1.7 - 7.7 K/uL   Lymphocytes Relative 34  12 - 46 %   Lymphs Abs 2.9  0.7 - 4.0 K/uL   Monocytes Relative 7  3 - 12 %   Monocytes Absolute 0.6  0.1 - 1.0 K/uL   Eosinophils Relative 2  0 - 5 %   Eosinophils Absolute 0.1  0.0 - 0.7 K/uL   Basophils Relative 0  0 - 1 %   Basophils Absolute 0.0  0.0 - 0.1 K/uL  COMPREHENSIVE METABOLIC PANEL      Component Value Range   Sodium 139  135 - 145 mEq/L   Potassium 3.3 (*) 3.5 - 5.1 mEq/L   Chloride 102  96 - 112 mEq/L   CO2 28  19 - 32 mEq/L   Glucose, Bld 83  70 - 99 mg/dL   BUN 6  6 - 23 mg/dL   Creatinine, Ser 1.30  0.50 - 1.10 mg/dL   Calcium 9.0  8.4 - 86.5 mg/dL   Total Protein 7.6  6.0 - 8.3 g/dL   Albumin 3.9  3.5 - 5.2 g/dL   AST 11  0 - 37 U/L   ALT 9  0 - 35 U/L   Alkaline Phosphatase 100  39 - 117 U/L   Total Bilirubin 0.2 (*) 0.3 - 1.2 mg/dL   GFR calc non Af Amer >90  >90 mL/min   GFR calc Af Amer >90  >90 mL/min  LIPASE, BLOOD      Component Value Range   Lipase 20  11 - 59 U/L  WET PREP, GENITAL      Component Value Range   Yeast Wet Prep HPF POC NONE SEEN  NONE SEEN   Trich, Wet Prep NONE SEEN  NONE SEEN   Clue Cells Wet Prep HPF POC NONE SEEN  NONE SEEN   WBC, Wet Prep HPF POC NONE SEEN  NONE SEEN   US Transvaginal Non-ob  06/24/2012  *RADIOLOGY REPORT*  Clinical Data: Suprapubic pain, vomiting   TRANSABDOMINAL AND TRANSVAGINAL ULTRASOUND OF PELVIS Technique:  Both transabdominal and transvaginal ultrasound examinations of the pelvis were performed. Transabdominal technique was performed for global imaging of the pelvis including uterus, ovaries, adnexal regions, and pelvic cul-de-sac.  It was necessary to proceed with endovaginal exam following the transabdominal exam to visualize the endometrium.  Comparison:  04/19/2011  Findings:  Uterus: 9.2 x 4.1 x 4.3 cm.  Normal morphology without mass.  Endometrium: 5 mm thick, normal.  No endometrial fluid.  Few Nabothian cysts noted.  Right ovary:  2.6 x 2.4 x 2.8 cm.  Normal morphology without mass.  Left ovary: 2.6 x 2.3 x 2.3 cm.  Normal morphology without mass.  Other findings: No adnexal masses or free pelvic fluid.  Visualized portion of urinary bladder unremarkable.  IMPRESSION: Normal exam. No evidence of pelvic mass or other significant abnormality.  Original Report Authenticated By: Ulyses Southward, M.D.    US Pelvis Complete  06/24/2012  *RADIOLOGY REPORT*  Clinical Data: Suprapubic pain, vomiting  TRANSABDOMINAL AND TRANSVAGINAL ULTRASOUND OF PELVIS Technique:  Both transabdominal and transvaginal ultrasound examinations of the pelvis were performed. Transabdominal technique was performed for global imaging of the pelvis including uterus, ovaries, adnexal regions, and pelvic cul-de-sac.  It was necessary to proceed with endovaginal exam following the transabdominal exam to visualize the endometrium.  Comparison:  04/19/2011  Findings:  Uterus: 9.2 x 4.1 x 4.3 cm.  Normal morphology without mass.  Endometrium: 5 mm thick, normal.  No endometrial fluid.  Few Nabothian cysts noted.  Right ovary:  2.6 x 2.4 x 2.8 cm.  Normal morphology without mass.  Left ovary: 2.6 x 2.3 x 2.3 cm.  Normal morphology without mass.  Other findings: No adnexal masses or free pelvic fluid.  Visualized portion of urinary bladder unremarkable.  IMPRESSION: Normal exam. No  evidence of pelvic mass or other significant abnormality.   Original Report Authenticated By: Ulyses Southward, M.D.    7:36 PM Lab tests so far are negative.  Pain has recurred, so remedicated for pain with Dilaudid 1 mg IV and Zofran 4 mg IV.  Requested in and out cath to obtain UA so as to complete her workup.   8:56 PM UA and UPG are negative.  No serious cause of her abdominal pain found.  She can followup with her doctor at [facility] on Monday.  Rx hydrocodone-acetaminophen q4h prn pain, Phenergan 25 mg q4n prn nausea.  9:06 PM Pt was rechecked, she seems normal and more comfortable, discharge was discussed   1. Abdominal pain in female patient        Carleene Cooper III, MD 06/26/12 2019

## 2012-06-24 NOTE — ED Notes (Signed)
Pt reports nausea, severe abd pain that radiates around to her back.  Pt reports having a positive pregnancy test at home in November but when she followed up with her obgyn, she was not.  Pt reports having a CT scan done this a.m at [facility] and was advised to have a scan with dye.

## 2012-06-25 LAB — GC/CHLAMYDIA PROBE AMP: GC Probe RNA: NEGATIVE

## 2012-08-02 ENCOUNTER — Encounter: Payer: Self-pay | Admitting: Gastroenterology

## 2012-08-17 ENCOUNTER — Ambulatory Visit: Payer: BC Managed Care – PPO | Admitting: Gastroenterology

## 2012-11-03 IMAGING — US US BREAST BILAT
1 series · 8 of 8 positions shown · non-contrast
Comparison: Prior exams

CLINICAL DATA: Short-term follow-up probably benign breast masses
bilaterally

BILATERAL BREAST ULTRASOUND

[Series 1: us breast bilat · 8 of 8 slices shown]
[im 1/8]
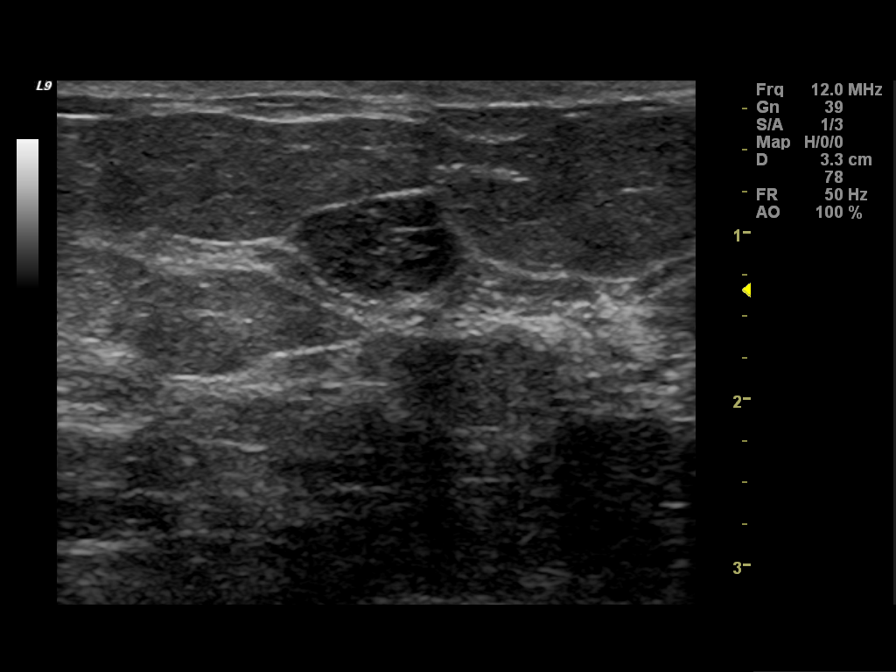
[im 2/8]
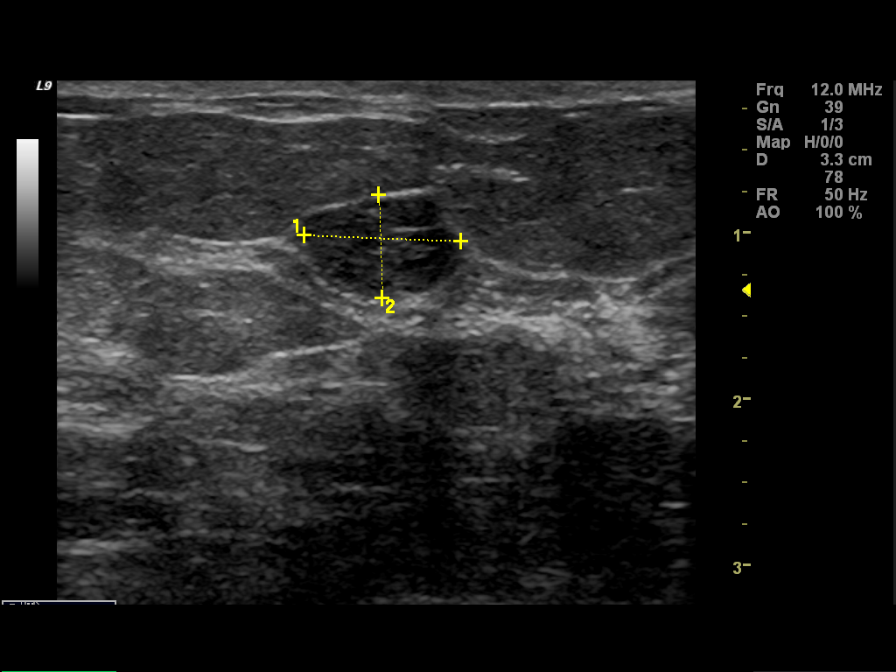
[im 3/8]
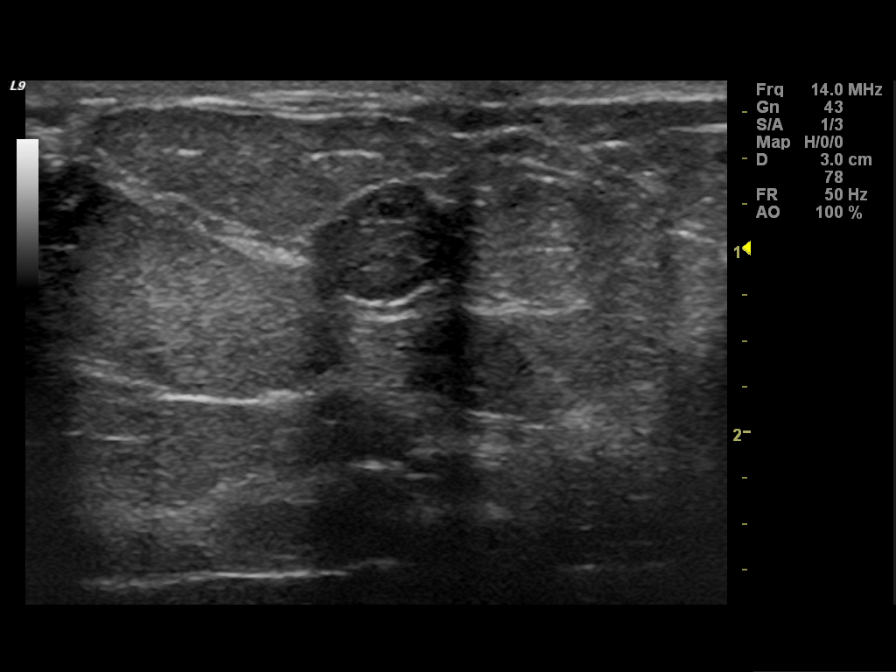
[im 4/8]
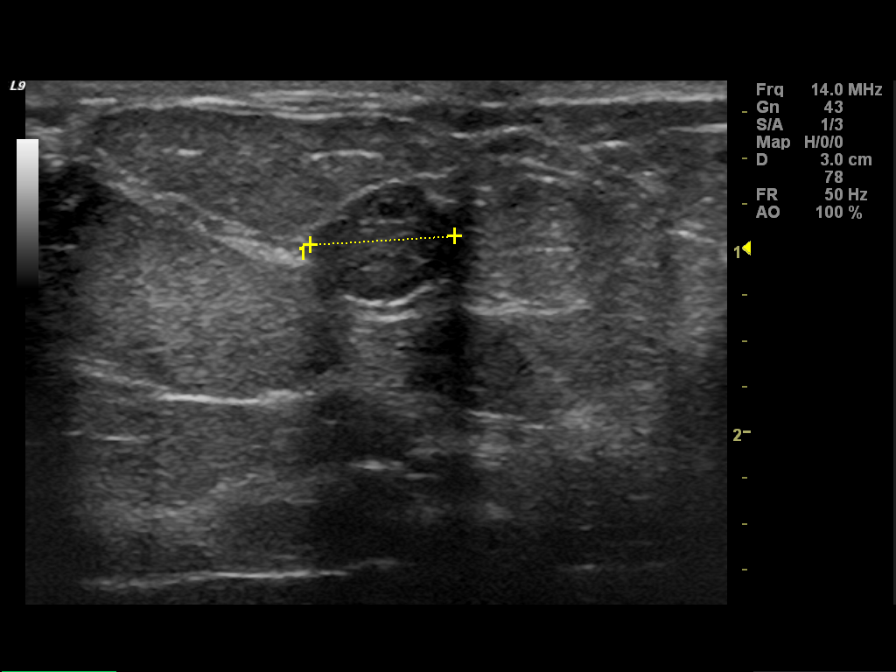
[im 5/8]
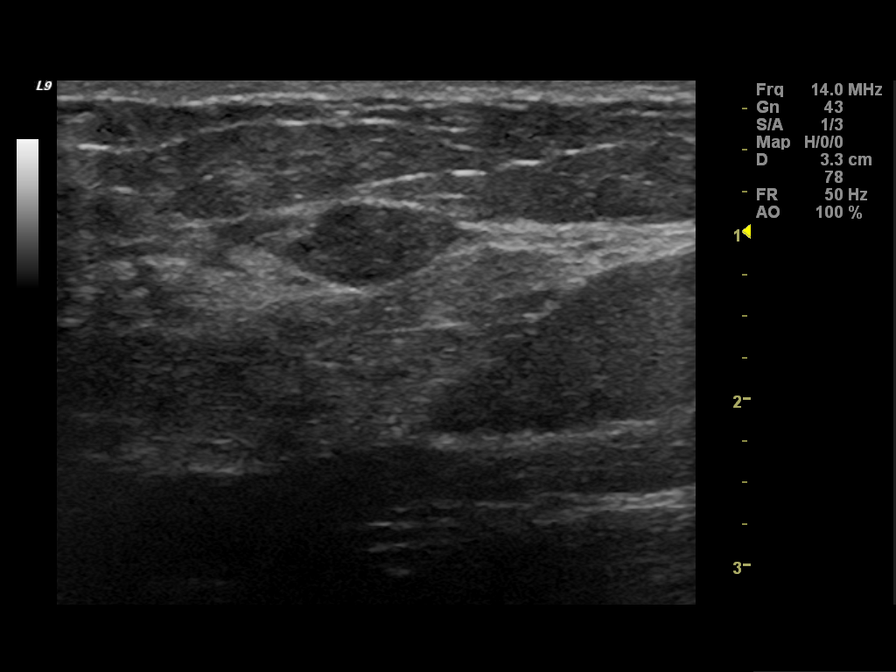
[im 6/8]
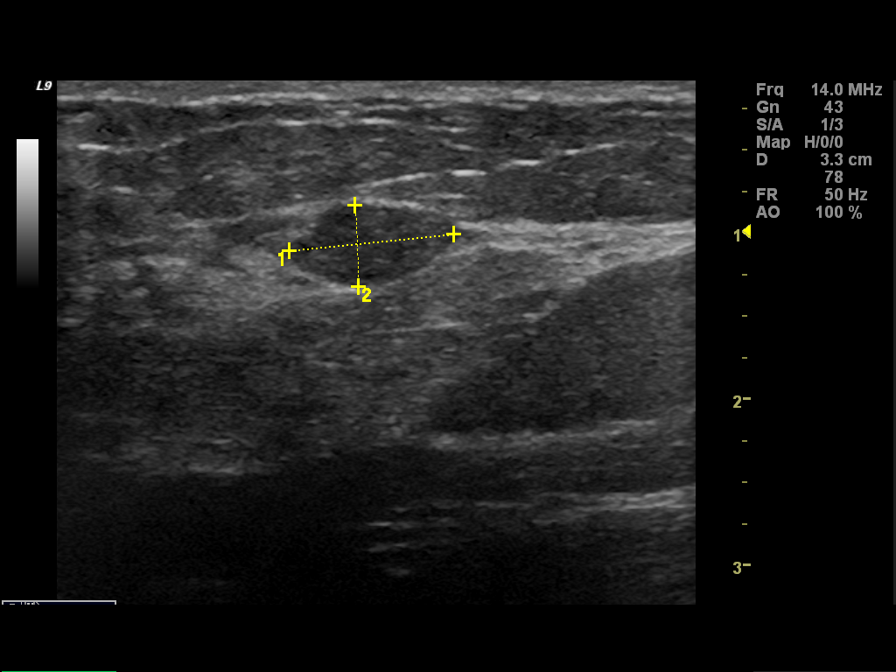
[im 7/8]
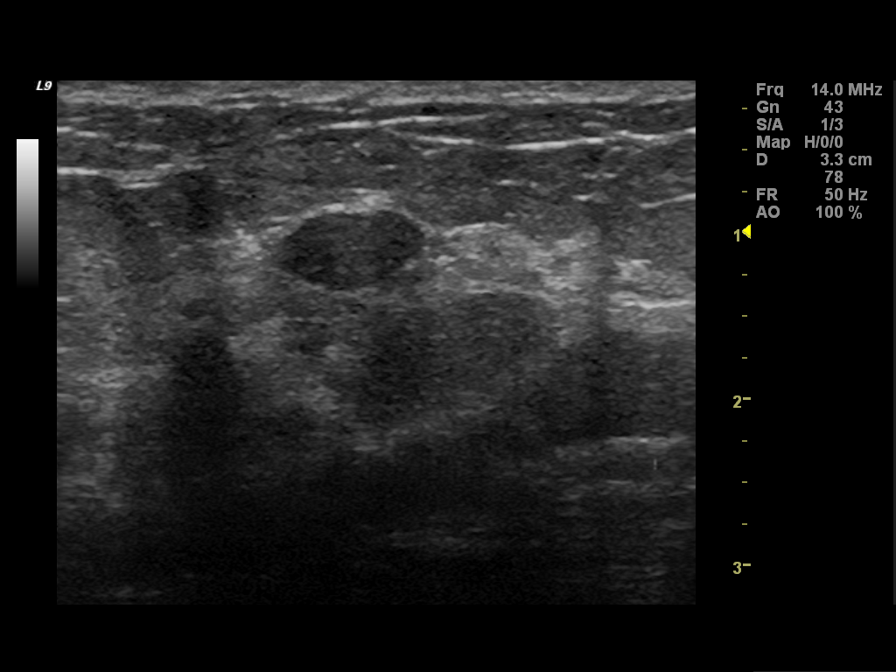
[im 8/8]
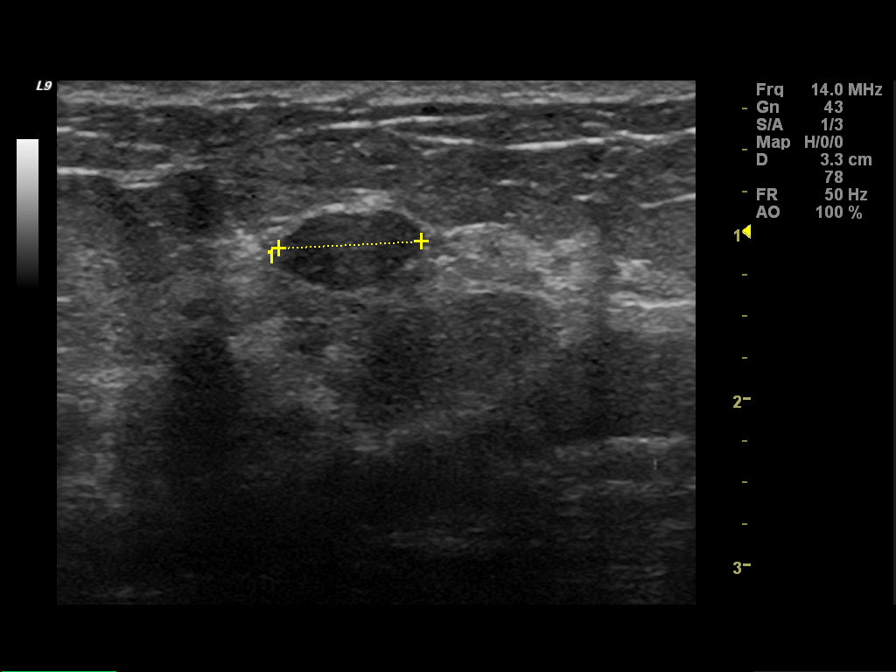

[8 of 8 positions shown; findings below may reference images not displayed]

FINDINGS: Ultrasound is performed, showing oval circumscribed
hypoechoic masses as follows:

Right breast one o'clock location, 5 cm from the nipple, 1.0 x
x 0.5 cm (previously 1.0 x 1.0 x 0.6 cm).

Left breast seven o'clock location 2 cm from the nipple, 0.9 x
x 0.6 cm (previously 1.0 x 0.9 x 0.7 cm).
IMPRESSION: Stable versus slightly decreased in benign appearing masses
bilaterally.  This completes 2-year follow-up of the right breast
one o'clock location mass and establishes benignity by imaging
criteria, likely a fibroadenoma.  No specific imaging finding is
needed for the right breast.

No change in left breast seven o'clock location probably benign
mass.  Left breast ultrasound March 2012 is recommended to
complete 2-year follow-up of the left breast mass.

If there is no change that time, the patient will next be due for
bilateral screening mammography at age 40. Findings and
recommendations discussed with the patient and provided in written
form at the time of the exam.

BI-RADS CATEGORY 3:  Probably benign finding(s) - short interval
follow-up suggested.

Recommendation:  Left breast ultrasound March 2012

## 2013-11-01 ENCOUNTER — Emergency Department (HOSPITAL_COMMUNITY)
Admission: EM | Admit: 2013-11-01 | Discharge: 2013-11-02 | Disposition: A | Discharge status: Home / Self Care | Payer: BC Managed Care – PPO | Attending: Emergency Medicine | Admitting: Emergency Medicine

## 2013-11-01 ENCOUNTER — Emergency Department (HOSPITAL_COMMUNITY): Payer: BC Managed Care – PPO

## 2013-11-01 ENCOUNTER — Ambulatory Visit (INDEPENDENT_AMBULATORY_CARE_PROVIDER_SITE_OTHER): Payer: BC Managed Care – PPO | Admitting: Family Medicine

## 2013-11-01 ENCOUNTER — Encounter: Payer: Self-pay | Admitting: Family Medicine

## 2013-11-01 ENCOUNTER — Encounter (HOSPITAL_COMMUNITY): Payer: Self-pay | Admitting: Emergency Medicine

## 2013-11-01 VITALS — BP 105/61 | HR 72 | Temp 98.6°F | Ht 62.0 in | Wt 208.8 lb

## 2013-11-01 DIAGNOSIS — Z3202 Encounter for pregnancy test, result negative: Secondary | ICD-10-CM | POA: Insufficient documentation

## 2013-11-01 DIAGNOSIS — M6281 Muscle weakness (generalized): Secondary | ICD-10-CM | POA: Insufficient documentation

## 2013-11-01 DIAGNOSIS — Z8709 Personal history of other diseases of the respiratory system: Secondary | ICD-10-CM | POA: Insufficient documentation

## 2013-11-01 DIAGNOSIS — Z87442 Personal history of urinary calculi: Secondary | ICD-10-CM | POA: Insufficient documentation

## 2013-11-01 DIAGNOSIS — Z85820 Personal history of malignant melanoma of skin: Secondary | ICD-10-CM | POA: Insufficient documentation

## 2013-11-01 DIAGNOSIS — G43909 Migraine, unspecified, not intractable, without status migrainosus: Secondary | ICD-10-CM | POA: Insufficient documentation

## 2013-11-01 DIAGNOSIS — R1011 Right upper quadrant pain: Secondary | ICD-10-CM | POA: Insufficient documentation

## 2013-11-01 DIAGNOSIS — K1370 Unspecified lesions of oral mucosa: Secondary | ICD-10-CM | POA: Insufficient documentation

## 2013-11-01 DIAGNOSIS — R531 Weakness: Secondary | ICD-10-CM

## 2013-11-01 DIAGNOSIS — R5383 Other fatigue: Secondary | ICD-10-CM

## 2013-11-01 DIAGNOSIS — R5381 Other malaise: Secondary | ICD-10-CM

## 2013-11-01 DIAGNOSIS — Z8619 Personal history of other infectious and parasitic diseases: Secondary | ICD-10-CM | POA: Insufficient documentation

## 2013-11-01 DIAGNOSIS — R109 Unspecified abdominal pain: Secondary | ICD-10-CM

## 2013-11-01 DIAGNOSIS — Z8739 Personal history of other diseases of the musculoskeletal system and connective tissue: Secondary | ICD-10-CM | POA: Insufficient documentation

## 2013-11-01 DIAGNOSIS — G40909 Epilepsy, unspecified, not intractable, without status epilepticus: Secondary | ICD-10-CM | POA: Insufficient documentation

## 2013-11-01 DIAGNOSIS — Z79899 Other long term (current) drug therapy: Secondary | ICD-10-CM | POA: Insufficient documentation

## 2013-11-01 DIAGNOSIS — R110 Nausea: Secondary | ICD-10-CM

## 2013-11-01 DIAGNOSIS — K219 Gastro-esophageal reflux disease without esophagitis: Secondary | ICD-10-CM | POA: Insufficient documentation

## 2013-11-01 DIAGNOSIS — Z8742 Personal history of other diseases of the female genital tract: Secondary | ICD-10-CM | POA: Insufficient documentation

## 2013-11-01 LAB — POCT CBC
Granulocyte percent: 63.8 %G (ref 37–80)
HCT, POC: 35.5 % — AB (ref 37.7–47.9)
Hemoglobin: 11.6 g/dL — AB (ref 12.2–16.2)
Lymph, poc: 3.2 (ref 0.6–3.4)
MCH, POC: 27.6 pg (ref 27–31.2)
MCHC: 32.8 g/dL (ref 31.8–35.4)
MCV: 84.1 fL (ref 80–97)
MPV: 6.9 fL (ref 0–99.8)
POC Granulocyte: 6.8 (ref 2–6.9)
POC LYMPH PERCENT: 29.7 %L (ref 10–50)
Platelet Count, POC: 373 10*3/uL (ref 142–424)
RBC: 4.2 M/uL (ref 4.04–5.48)
RDW, POC: 13.4 %
WBC: 10.7 10*3/uL — AB (ref 4.6–10.2)

## 2013-11-01 LAB — POCT URINALYSIS DIPSTICK
Bilirubin, UA: NEGATIVE
Glucose, UA: NEGATIVE
Ketones, UA: NEGATIVE
Leukocytes, UA: NEGATIVE
Nitrite, UA: NEGATIVE
Spec Grav, UA: 1.01
Urobilinogen, UA: NEGATIVE
pH, UA: 7

## 2013-11-01 LAB — POCT UA - MICROSCOPIC ONLY
Casts, Ur, LPF, POC: NEGATIVE
Crystals, Ur, HPF, POC: NEGATIVE
Mucus, UA: NEGATIVE
Yeast, UA: NEGATIVE

## 2013-11-01 MED ORDER — IOHEXOL 300 MG/ML  SOLN: 50.0000 mL | Freq: Once | INTRAMUSCULAR | Status: AC | PRN

## 2013-11-01 MED ORDER — IOHEXOL 300 MG/ML  SOLN: 100.0000 mL | Freq: Once | INTRAMUSCULAR | Status: AC | PRN

## 2013-11-01 NOTE — ED Notes (Signed)
Having abdominal pain that has been going on for the past five days. Can't eat, feeling fatigued per pt. Also having sores come up on my mouth.

## 2013-11-01 NOTE — ED Provider Notes (Signed)
CSN: [id number]     Arrival date & time 11/01/13  1907 History  This chart was scribed for Julianne Rice, MD by Rolanda Lundborg, ED Scribe. This patient was seen in room APA09/APA09 and the patient's care was started at 11:05 PM.    Chief Complaint  Patient presents with  . Abdominal Pain   Patient is a 33 y.o. female presenting with abdominal pain. The history is provided by the patient. No language interpreter was used.  Abdominal Pain Pain location:  RUQ Duration:  5 days Timing:  Constant Progression:  Waxing and waning Associated symptoms: fatigue and nausea   Associated symptoms: no chest pain, no chills, no constipation, no cough, no diarrhea, no fever, no shortness of breath and no vomiting    HPI Comments: [name] is a 33 y.o. female who presents to the Emergency Department complaining of waxing and waning RUQ abdominal pain onset 5 days ago with associated generalized weakness. She was sent here by PCP to rule out cholecystitis. . She denies any associations between pain and food. She reports nausea but no vomiting. She denies fevers. Last BM was today and normal. She states she saw a small brown spider while in the bath tub before the pain started and is worried about a possible bite. H/o cesarean but no other abdominal surgeries.  She also reports mouth sores that came up 6 days ago.    Past Medical History  Diagnosis Date  . Migraine headache   . IBS (irritable bowel syndrome)   . Pulmonary nodule     previous evaluation - benign  . Allergic rhinitis   . Previous stillbirth or demise, antepartum   . Kidney stones   . Melanoma of hip   . Staphylococcal infection   . Sepsis(995.91)   . Epilepsy   . Acne fulminans   . Petit mal epilepsy   . Breast nodule   . Brain cyst   . Small bowel obstruction   . Cervical polyp   . Menorrhagia   . Abnormal Pap smear of cervix   . Seizures     recently diagnosed with epilepsy  . Shortness of breath     due to wt  .  Fibromyalgia   . GERD (gastroesophageal reflux disease)    Past Surgical History  Procedure Laterality Date  . Breast lumpectomy  2008    right breast - benign; needle - localized excision of right breast mass x2  . Caesarean section    . Melanoma excision    . Svd      x 1 - stillborn at 107 weeks  . Hysteroscopy w/d&c  04/27/2011    Procedure: DILATATION AND CURETTAGE (D&C) /HYSTEROSCOPY;  Surgeon: Thurnell Lose, MD;  Location: [facility];  Service: Gynecology;  Laterality: N/A;   Family History  Problem Relation Age of Onset  . Emphysema Paternal Grandmother   . Heart disease Paternal Grandmother   . Emphysema Paternal Grandfather   . Cancer Paternal Grandfather     throat cancer   History  Substance Use Topics  . Smoking status: Never Smoker   . Smokeless tobacco: Not on file  . Alcohol Use: No   OB History   Grav Para Term Preterm Abortions TAB SAB Ect Mult Living   2 2 2       1      Review of Systems  Constitutional: Positive for fatigue. Negative for fever and chills.  HENT: Positive for mouth sores.   Respiratory: Negative for  cough and shortness of breath.   Cardiovascular: Negative for chest pain, palpitations and leg swelling.  Gastrointestinal: Positive for nausea and abdominal pain. Negative for vomiting, diarrhea, constipation and blood in stool.  Musculoskeletal: Negative for back pain, myalgias, neck pain and neck stiffness.  Skin: Negative for rash and wound.  Neurological: Positive for weakness. Negative for dizziness, syncope, light-headedness and numbness.  All other systems reviewed and are negative.  A complete 10 system review of systems was obtained and all systems are negative except as noted in the HPI and PMH.     Allergies  Amitriptyline; Cymbalta; and Sulfonamide derivatives  Home Medications   Prior to Admission medications   Medication Sig Start Date End Date Taking? Authorizing Provider  clonazePAM (KLONOPIN) 0.5 MG tablet Take 0.5 mg  by mouth at bedtime.   Yes Historical Provider, MD  lacosamide (VIMPAT) 200 MG TABS Take 200 mg by mouth 2 (two) times daily.   Yes Historical Provider, MD  LamoTRIgine 50 MG TBDP Take 50 mg by mouth 2 (two) times daily.   Yes Historical Provider, MD  omeprazole (PRILOSEC) 20 MG capsule Take 20 mg by mouth 2 (two) times daily. 10/30/13  Yes Historical Provider, MD  etonogestrel-ethinyl estradiol (NUVARING) 0.12-0.015 MG/24HR vaginal ring Place 1 each vaginally every 28 (twenty-eight) days. Insert vaginally and leave in place for 3 consecutive weeks, then remove for 1 week.    Historical Provider, MD   BP 141/80  Pulse 77  Temp(Src) 98.4 F (36.9 C) (Oral)  Resp 20  Ht 5\' 2"  (1.575 m)  Wt 196 lb (88.905 kg)  BMI 35.84 kg/m2  SpO2 99%  LMP 10/18/2013 Physical Exam  Nursing note and vitals reviewed. Constitutional: She is oriented to person, place, and time. She appears well-developed and well-nourished. No distress.  HENT:  Head: Normocephalic and atraumatic.  Mouth/Throat: Oropharynx is clear and moist. No oropharyngeal exudate.  Bilateral angular chleitis   Eyes: EOM are normal. Pupils are equal, round, and reactive to light.  Neck: Normal range of motion. Neck supple.  No meningismus  Cardiovascular: Normal rate and regular rhythm.  Exam reveals no gallop and no friction rub.   No murmur heard. Pulmonary/Chest: Effort normal and breath sounds normal. No respiratory distress. She has no wheezes. She has no rales. She exhibits no tenderness.  Abdominal: Soft. Bowel sounds are normal. She exhibits no distension and no mass. There is tenderness (tenderness to palpation in the right upper and right lower quadrants. There is no rebound or guarding.). There is no rebound and no guarding.  Musculoskeletal: Normal range of motion. She exhibits no edema and no tenderness.  No CVA tenderness bilaterally.  Neurological: She is alert and oriented to person, place, and time.  Patient is alert and  oriented x3 with clear, goal oriented speech. Patient has 5/5 motor in all extremities. Sensation is intact to light touch. Bilateral finger-to-nose is normal with no signs of dysmetria. Patient has a normal gait and walks without assistance.   Skin: Skin is warm and dry. No rash noted. No erythema. No pallor.  Psychiatric: She has a normal mood and affect. Her behavior is normal.    ED Course  Procedures (including critical care time) Medications - No data to display  DIAGNOSTIC STUDIES: Oxygen Saturation is 99% on RA, normal by my interpretation.    COORDINATION OF CARE: 11:24 PM- Discussed treatment plan with pt. Pt agrees to plan.    Labs Review Labs Reviewed - No data to display  Imaging Review No results found.   EKG Interpretation None      MDM   Final diagnoses:  None   I personally performed the services described in this documentation, which was scribed in my presence. The recorded information has been reviewed and is accurate.  No acute findings on CT of the abdomen. Mild elevation the patient's Hattery blood cell otherwise her laboratory evaluations are normal. Patient is resting comfortably in the room. She is advised to followup with her primary Dr. She will call this morning to arrange that. Return precautions have been given.    Julianne Rice, MD 11/02/13 [phone]

## 2013-11-01 NOTE — ED Notes (Signed)
MD office sent me over to be checked for an enlarged spleen and/or bladder problems.

## 2013-11-01 NOTE — Progress Notes (Signed)
Subjective:    Patient ID: [name], female    DOB: 05-11-1981, 32 y.o.   MRN: [id number]  HPI This 33 y.o. female presents for evaluation of multiple constitutional complaints.  She is having fever, nausea, weakness, chills, muscle aches, abdominal pain, fever blisters on the edges of her mouth,and low appetite for 4 days now.  She is dragging at work and is very tired.  She denies any Uri sx's or productive cough.  She had a spider bite her several days ago and this has healed. She denies tick bites.  She states it hurts to take a deep breath.  She c/o nausea and doesn't feel like eating.  She felt so bad last night she was going to go to the ED.  Her husband told her to go see the doctor today.  She has hx of SZ disorder.  She has hx of IBS.   Review of Systems C/o abdominal pain, Nausea, fever, weakness, and myalgias No chest pain, SOB, HA, dizziness, vision change, diarrhea, constipation, dysuria, urinary urgency or frequency or rash.     Objective:   Physical Exam  Vital signs noted  Well developed well nourished obese female in NAD.  HEENT - Head atraumatic Normocephalic                Eyes - PERRLA, Conjuctiva - clear Sclera- Clear EOMI                Ears - EAC's Wnl TM's Wnl Gross Hearing WNL                Throat - oropharanx wnl and mouth with cracks at edges and erythema Respiratory - Lungs CTA bilateral Cardiac - RRR S1 and S2 without murmur GI - Abdomen soft tender RUQ and positive Murphy's sign and bowel sounds active x 4 Extremities - No edema. Neuro - Grossly intact.  Results for orders placed in visit on 11/01/13  POCT CBC      Result Value Ref Range   WBC 10.7 (*) 4.6 - 10.2 K/uL   Lymph, poc 3.2  0.6 - 3.4   POC LYMPH PERCENT 29.7  10 - 50 %L   POC Granulocyte 6.8  2 - 6.9   Granulocyte percent 63.8  37 - 80 %G   RBC 4.2  4.04 - 5.48 M/uL   Hemoglobin 11.6 (*) 12.2 - 16.2 g/dL   HCT, POC 35.5 (*) 37.7 - 47.9 %   MCV 84.1  80 - 97 fL   MCH, POC  27.6  27 - 31.2 pg   MCHC 32.8  31.8 - 35.4 g/dL   RDW, POC 13.4     Platelet Count, POC 373.0  142 - 424 K/uL   MPV 6.9  0 - 99.8 fL  POCT UA - MICROSCOPIC ONLY      Result Value Ref Range   WBC, Ur, HPF, POC 1-5     RBC, urine, microscopic occ     Bacteria, U Microscopic few     Mucus, UA negative     Epithelial cells, urine per micros few     Crystals, Ur, HPF, POC negative     Casts, Ur, LPF, POC negative     Yeast, UA negative    POCT URINALYSIS DIPSTICK      Result Value Ref Range   Color, UA gold     Clarity, UA clear     Glucose, UA negative     Bilirubin, UA negative  Ketones, UA negative     Spec Grav, UA 1.010     Blood, UA trace     pH, UA 7.0     Protein, UA 4+     Urobilinogen, UA negative     Nitrite, UA negative     Leukocytes, UA Negative        Assessment & Plan:  Weakness - Plan: [place] spotted fvr abs pnl(IgG+IgM), Lyme Ab/Western Blot Reflex, Mononucleosis screen  Nausea alone - Plan: POCT CBC, POCT UA - Microscopic Only, POCT urinalysis dipstick, CMP14+EGFR, Thyroid Panel With TSH  Abdominal pain, unspecified site - Plan: POCT CBC, POCT UA - Microscopic Only, POCT urinalysis dipstick, CMP14+EGFR, Thyroid Panel With TSH.  Positive Murphy's and consider GB disease  Leukocytosis - Mild elevated wbc.  Since she is having some RUQ abdominal pain and Murphy's sign with exam I am concerned  About her having abdominal pathology like cholecystitis.   Recommend she go to the ED for eval and treat.  Recommend she follow up afterwards here in the office after her DC.  Lysbeth Penner FNP

## 2013-11-02 ENCOUNTER — Telehealth: Payer: Self-pay | Admitting: Family Medicine

## 2013-11-02 ENCOUNTER — Other Ambulatory Visit: Payer: Self-pay | Admitting: Family Medicine

## 2013-11-02 ENCOUNTER — Encounter: Payer: Self-pay | Admitting: Cardiology

## 2013-11-02 DIAGNOSIS — R1011 Right upper quadrant pain: Secondary | ICD-10-CM

## 2013-11-02 LAB — URINALYSIS, ROUTINE W REFLEX MICROSCOPIC
Bilirubin Urine: NEGATIVE
Glucose, UA: NEGATIVE mg/dL
Hgb urine dipstick: NEGATIVE
Ketones, ur: NEGATIVE mg/dL
LEUKOCYTES UA: NEGATIVE
NITRITE: NEGATIVE
PH: 6 (ref 5.0–8.0)
Protein, ur: NEGATIVE mg/dL
SPECIFIC GRAVITY, URINE: 1.025 (ref 1.005–1.030)
Urobilinogen, UA: 0.2 mg/dL (ref 0.0–1.0)

## 2013-11-02 LAB — PREGNANCY, URINE: PREG TEST UR: NEGATIVE

## 2013-11-02 LAB — COMPREHENSIVE METABOLIC PANEL
ALBUMIN: 3.3 g/dL — AB (ref 3.5–5.2)
ALK PHOS: 87 U/L (ref 39–117)
ALT: 12 U/L (ref 0–35)
AST: 15 U/L (ref 0–37)
BUN: 11 mg/dL (ref 6–23)
CALCIUM: 9 mg/dL (ref 8.4–10.5)
CO2: 26 mEq/L (ref 19–32)
CREATININE: 0.7 mg/dL (ref 0.50–1.10)
Chloride: 103 mEq/L (ref 96–112)
GFR calc Af Amer: >90 mL/min (ref >90)
GFR calc non Af Amer: >90 mL/min (ref >90)
Glucose, Bld: 96 mg/dL (ref 70–99)
POTASSIUM: 4 meq/L (ref 3.7–5.3)
Sodium: 141 mEq/L (ref 137–147)
TOTAL PROTEIN: 7.4 g/dL (ref 6.0–8.3)
Total Bilirubin: <0.2 mg/dL — ABNORMAL LOW (ref 0.3–1.2)

## 2013-11-02 LAB — CBC WITH DIFFERENTIAL/PLATELET
BASOS PCT: 0 % (ref 0–1)
Basophils Absolute: 0 10*3/uL (ref 0.0–0.1)
EOS ABS: 0.2 10*3/uL (ref 0.0–0.7)
Eosinophils Relative: 1 % (ref 0–5)
HCT: 34.7 % — ABNORMAL LOW (ref 36.0–46.0)
HEMOGLOBIN: 11.4 g/dL — AB (ref 12.0–15.0)
Lymphocytes Relative: 33 % (ref 12–46)
Lymphs Abs: 3.7 10*3/uL (ref 0.7–4.0)
MCH: 28.4 pg (ref 26.0–34.0)
MCHC: 32.9 g/dL (ref 30.0–36.0)
MCV: 86.3 fL (ref 78.0–100.0)
MONOS PCT: 7 % (ref 3–12)
Monocytes Absolute: 0.8 10*3/uL (ref 0.1–1.0)
NEUTROS PCT: 59 % (ref 43–77)
Neutro Abs: 6.7 10*3/uL (ref 1.7–7.7)
Platelets: 359 10*3/uL (ref 150–400)
RBC: 4.02 MIL/uL (ref 3.87–5.11)
RDW: 13.2 % (ref 11.5–15.5)
WBC: 11.4 10*3/uL — ABNORMAL HIGH (ref 4.0–10.5)

## 2013-11-02 LAB — LIPASE, BLOOD: LIPASE: 36 U/L (ref 11–59)

## 2013-11-02 MED ORDER — IOHEXOL 300 MG/ML  SOLN
100.0000 mL | Freq: Once | INTRAMUSCULAR | Status: AC | PRN
  Administered 2013-11-02: 100 mL via INTRAVENOUS

## 2013-11-02 MED ORDER — IOHEXOL 300 MG/ML  SOLN
50.0000 mL | Freq: Once | INTRAMUSCULAR | Status: AC | PRN
  Administered 2013-11-02: 50 mL via ORAL

## 2013-11-02 MED ORDER — ONDANSETRON 4 MG PO TBDP: ORAL_TABLET | ORAL | Status: DC

## 2013-11-02 MED ORDER — MORPHINE SULFATE 4 MG/ML IJ SOLN
4.0000 mg | Freq: Once | INTRAMUSCULAR | Status: AC
  Administered 2013-11-02: 4 mg via INTRAVENOUS
  Filled 2013-11-02: qty 1

## 2013-11-02 MED ORDER — HYDROCODONE-ACETAMINOPHEN 5-325 MG PO TABS: 1.0000 | ORAL_TABLET | ORAL | Status: DC | PRN

## 2013-11-02 MED ORDER — ONDANSETRON HCL 4 MG/2ML IJ SOLN
4.0000 mg | Freq: Once | INTRAMUSCULAR | Status: AC
  Administered 2013-11-02: 4 mg via INTRAVENOUS
  Filled 2013-11-02: qty 2

## 2013-11-02 NOTE — Discharge Instructions (Signed)
Abdominal Pain, Women °Abdominal (stomach, pelvic, or belly) pain can be caused by many things. It is important to tell your doctor: °· The location of the pain. °· Does it come and go or is it present all the time? °· Are there things that start the pain (eating certain foods, exercise)? °· Are there other symptoms associated with the pain (fever, nausea, vomiting, diarrhea)? °All of this is helpful to know when trying to find the cause of the pain. °CAUSES  °· Stomach: virus or bacteria infection, or ulcer. °· Intestine: appendicitis (inflamed appendix), regional ileitis (Crohn's disease), ulcerative colitis (inflamed colon), irritable bowel syndrome, diverticulitis (inflamed diverticulum of the colon), or cancer of the stomach or intestine. °· Gallbladder disease or stones in the gallbladder. °· Kidney disease, kidney stones, or infection. °· Pancreas infection or cancer. °· Fibromyalgia (pain disorder). °· Diseases of the female organs: °· Uterus: fibroid (non-cancerous) tumors or infection. °· Fallopian tubes: infection or tubal pregnancy. °· Ovary: cysts or tumors. °· Pelvic adhesions (scar tissue). °· Endometriosis (uterus lining tissue growing in the pelvis and on the pelvic organs). °· Pelvic congestion syndrome (female organs filling up with blood just before the menstrual period). °· Pain with the menstrual period. °· Pain with ovulation (producing an egg). °· Pain with an IUD (intrauterine device, birth control) in the uterus. °· Cancer of the female organs. °· Functional pain (pain not caused by a disease, may improve without treatment). °· Psychological pain. °· Depression. °DIAGNOSIS  °Your doctor will decide the seriousness of your pain by doing an examination. °· Blood tests. °· X-rays. °· Ultrasound. °· CT scan (computed tomography, special type of X-ray). °· MRI (magnetic resonance imaging). °· Cultures, for infection. °· Barium enema (dye inserted in the large intestine, to better view it with  X-rays). °· Colonoscopy (looking in intestine with a lighted tube). °· Laparoscopy (minor surgery, looking in abdomen with a lighted tube). °· Major abdominal exploratory surgery (looking in abdomen with a large incision). °TREATMENT  °The treatment will depend on the cause of the pain.  °· Many cases can be observed and treated at home. °· Over-the-counter medicines recommended by your caregiver. °· Prescription medicine. °· Antibiotics, for infection. °· Birth control pills, for painful periods or for ovulation pain. °· Hormone treatment, for endometriosis. °· Nerve blocking injections. °· Physical therapy. °· Antidepressants. °· Counseling with a psychologist or psychiatrist. °· Minor or major surgery. °HOME CARE INSTRUCTIONS  °· Do not take laxatives, unless directed by your caregiver. °· Take over-the-counter pain medicine only if ordered by your caregiver. Do not take aspirin because it can cause an upset stomach or bleeding. °· Try a clear liquid diet (broth or water) as ordered by your caregiver. Slowly move to a bland diet, as tolerated, if the pain is related to the stomach or intestine. °· Have a thermometer and take your temperature several times a day, and record it. °· Bed rest and sleep, if it helps the pain. °· Avoid sexual intercourse, if it causes pain. °· Avoid stressful situations. °· Keep your follow-up appointments and tests, as your caregiver orders. °· If the pain does not go away with medicine or surgery, you may try: °· Acupuncture. °· Relaxation exercises (yoga, meditation). °· Group therapy. °· Counseling. °SEEK MEDICAL CARE IF:  °· You notice certain foods cause stomach pain. °· Your home care treatment is not helping your pain. °· You need stronger pain medicine. °· You want your IUD removed. °· You feel faint or   lightheaded. °· You develop nausea and vomiting. °· You develop a rash. °· You are having side effects or an allergy to your medicine. °SEEK IMMEDIATE MEDICAL CARE IF:  °· Your  pain does not go away or gets worse. °· You have a fever. °· Your pain is felt only in portions of the abdomen. The right side could possibly be appendicitis. The left lower portion of the abdomen could be colitis or diverticulitis. °· You are passing blood in your stools (bright red or black tarry stools, with or without vomiting). °· You have blood in your urine. °· You develop chills, with or without a fever. °· You pass out. °MAKE SURE YOU:  °· Understand these instructions. °· Will watch your condition. °· Will get help right away if you are not doing well or get worse. °Document Released: 03/29/2007 Document Revised: 08/24/2011 Document Reviewed: 04/18/2009 °[employer]® Patient Information ©2014 [employer]. ° °

## 2013-11-02 NOTE — Telephone Encounter (Signed)
I ordered urgent GB US and should here from referrals

## 2013-11-02 NOTE — Telephone Encounter (Signed)
Patient aware that results are not back yet. She wants to let you know also that her WBC were 11.4 last night in the ER and they didn't give her anything but something for nausea and she wants to know if you think she needs anything else.

## 2013-11-03 ENCOUNTER — Telehealth: Payer: Self-pay

## 2013-11-03 LAB — CMP14+EGFR
ALT: 15 IU/L (ref 0–32)
AST: 11 IU/L (ref 0–40)
Albumin/Globulin Ratio: 1.4 (ref 1.1–2.5)
Albumin: 3.9 g/dL (ref 3.5–5.5)
Alkaline Phosphatase: 86 IU/L (ref 39–117)
BUN/Creatinine Ratio: 15 (ref 8–20)
BUN: 10 mg/dL (ref 6–20)
CO2: 23 mmol/L (ref 18–29)
Calcium: 8.9 mg/dL (ref 8.7–10.2)
Chloride: 106 mmol/L (ref 97–108)
Creatinine, Ser: 0.66 mg/dL (ref 0.57–1.00)
GFR calc Af Amer: 134 mL/min/{1.73_m2} (ref >59)
GFR calc non Af Amer: 116 mL/min/{1.73_m2} (ref >59)
Globulin, Total: 2.7 g/dL (ref 1.5–4.5)
Glucose: 97 mg/dL (ref 65–99)
Potassium: 3.9 mmol/L (ref 3.5–5.2)
Sodium: 143 mmol/L (ref 134–144)
Total Bilirubin: <0.2 mg/dL (ref 0.0–1.2)
Total Protein: 6.6 g/dL (ref 6.0–8.5)

## 2013-11-03 LAB — ROCKY MTN SPOTTED FVR ABS PNL(IGG+IGM)
RMSF IgG: NEGATIVE
RMSF IgM: 0.37 index (ref 0.00–0.89)

## 2013-11-03 LAB — THYROID PANEL WITH TSH
Free Thyroxine Index: 2.3 (ref 1.2–4.9)
T3 Uptake Ratio: 18 % — ABNORMAL LOW (ref 24–39)
T4, Total: 13 ug/dL — ABNORMAL HIGH (ref 4.5–12.0)
TSH: 2.26 u[IU]/mL (ref 0.450–4.500)

## 2013-11-03 LAB — MONONUCLEOSIS SCREEN: Mono Screen: NEGATIVE

## 2013-11-03 LAB — LYME AB/WESTERN BLOT REFLEX
LYME DISEASE AB, QUANT, IGM: <0.8 index (ref 0.00–0.79)
Lyme IgG/IgM Ab: <0.91 {ISR} (ref 0.00–0.90)

## 2013-11-03 NOTE — Telephone Encounter (Signed)
I went to the ER Wednesday night had a CT done now [employer] ordering a Abdominal US  Don't believe I need that since I had a CT done but I can't even get out of bed without assistance from my husband and I am suppose to go back to work tommorow.    Please advise

## 2013-11-03 NOTE — Telephone Encounter (Signed)
All lab work here came back within normal limits except for minimal elevation of the Lame blood cell count. The test for spotted fever Lyme disease and mono were all negative the urinalysis had a few WBC. The CT scan from the hospital was reviewed and indicated that all of her internal organs including her ovaries liver spleen pancreas are all within normal limits. The appendix was not inflamed. The repeat Balan count was slightly more elevated than in our office. She should continue with Tylenol fluids frequently a 30 caffeine milk cheese ice cream and dairy products. This may be just an intestinal virus.

## 2013-11-03 NOTE — Telephone Encounter (Signed)
Do not return to work until we figure out the cause of the problem. She'll need a note to remain out of work. She still needs to get the additional test that Bill LOC for ordered. Follow through with this appointment. She should return to the clinic next week for further followup with [facility]. If she needs a note, please give her a work excuse until she can see him again.

## 2013-11-03 NOTE — Telephone Encounter (Signed)
Can you please review her labs please since bill oxford is not here

## 2013-11-03 NOTE — Telephone Encounter (Signed)
Patient aware and note given for work

## 2013-11-07 ENCOUNTER — Telehealth: Payer: Self-pay | Admitting: Family Medicine

## 2013-11-07 NOTE — Telephone Encounter (Signed)
Patient will come by tomorrow to pickup 24 hr urine collection container. She would like to know what to do about the sores in her mouth. When she opens her mouth they crack open.

## 2013-11-08 ENCOUNTER — Other Ambulatory Visit: Payer: Self-pay | Admitting: Family Medicine

## 2013-11-08 MED ORDER — NYSTATIN 100000 UNIT/GM EX CREA: 1.0000 "application " | TOPICAL_CREAM | Freq: Two times a day (BID) | CUTANEOUS | Status: DC

## 2013-11-08 NOTE — Telephone Encounter (Signed)
She has angular chelitis and a rx for nystatin cream was sent in and she needs to apply this to the edges of her lips bid and would recommend she follow up if not better.

## 2013-11-08 NOTE — Telephone Encounter (Signed)
Patient came in and spoke with kay today

## 2013-11-10 ENCOUNTER — Other Ambulatory Visit: Payer: BC Managed Care – PPO

## 2013-11-13 ENCOUNTER — Telehealth: Payer: Self-pay | Admitting: Family Medicine

## 2013-11-13 LAB — PROTEIN, URINE, 24 HOUR
Protein, 24H Urine: 142.4 mg/24 hr (ref 30.0–150.0)
Protein, Ur: 17.8 mg/dL — ABNORMAL HIGH (ref 0.0–15.0)

## 2013-11-13 LAB — SPECIMEN STATUS REPORT

## 2013-11-13 NOTE — Telephone Encounter (Signed)
24 hour urine just slightly elevated at 17 and high normal is 15 so would repeat urine in a few months.

## 2013-11-14 NOTE — Telephone Encounter (Signed)
Patient notified

## 2014-04-03 DIAGNOSIS — K900 Celiac disease: Secondary | ICD-10-CM | POA: Insufficient documentation

## 2014-04-10 ENCOUNTER — Other Ambulatory Visit: Payer: Self-pay | Admitting: Family Medicine

## 2014-04-10 DIAGNOSIS — N63 Unspecified lump in unspecified breast: Secondary | ICD-10-CM

## 2014-04-16 ENCOUNTER — Encounter (HOSPITAL_COMMUNITY): Payer: Self-pay | Admitting: Emergency Medicine

## 2014-04-20 ENCOUNTER — Encounter (INDEPENDENT_AMBULATORY_CARE_PROVIDER_SITE_OTHER): Payer: Self-pay

## 2014-04-20 ENCOUNTER — Ambulatory Visit
Admission: RE | Admit: 2014-04-20 | Discharge: 2014-04-20 | Disposition: A | Discharge status: Home / Self Care | Payer: BC Managed Care – PPO | Source: Ambulatory Visit | Attending: Family Medicine | Admitting: Family Medicine

## 2014-04-20 DIAGNOSIS — N63 Unspecified lump in unspecified breast: Secondary | ICD-10-CM

## 2014-07-25 ENCOUNTER — Emergency Department (HOSPITAL_COMMUNITY): Payer: BLUE CROSS/BLUE SHIELD

## 2014-07-25 ENCOUNTER — Emergency Department (HOSPITAL_COMMUNITY)
Admission: EM | Admit: 2014-07-25 | Discharge: 2014-07-25 | Disposition: A | Discharge status: Home / Self Care | Payer: BLUE CROSS/BLUE SHIELD | Attending: Emergency Medicine | Admitting: Emergency Medicine

## 2014-07-25 ENCOUNTER — Encounter (HOSPITAL_COMMUNITY): Payer: Self-pay | Admitting: *Deleted

## 2014-07-25 DIAGNOSIS — Y9289 Other specified places as the place of occurrence of the external cause: Secondary | ICD-10-CM | POA: Insufficient documentation

## 2014-07-25 DIAGNOSIS — Z79899 Other long term (current) drug therapy: Secondary | ICD-10-CM | POA: Insufficient documentation

## 2014-07-25 DIAGNOSIS — R4182 Altered mental status, unspecified: Secondary | ICD-10-CM | POA: Diagnosis present

## 2014-07-25 DIAGNOSIS — M797 Fibromyalgia: Secondary | ICD-10-CM | POA: Diagnosis not present

## 2014-07-25 DIAGNOSIS — N920 Excessive and frequent menstruation with regular cycle: Secondary | ICD-10-CM | POA: Diagnosis not present

## 2014-07-25 DIAGNOSIS — W1801XA Striking against sports equipment with subsequent fall, initial encounter: Secondary | ICD-10-CM | POA: Diagnosis not present

## 2014-07-25 DIAGNOSIS — S0990XA Unspecified injury of head, initial encounter: Secondary | ICD-10-CM

## 2014-07-25 DIAGNOSIS — Y9389 Activity, other specified: Secondary | ICD-10-CM | POA: Diagnosis not present

## 2014-07-25 DIAGNOSIS — Z8669 Personal history of other diseases of the nervous system and sense organs: Secondary | ICD-10-CM | POA: Diagnosis not present

## 2014-07-25 DIAGNOSIS — Z8619 Personal history of other infectious and parasitic diseases: Secondary | ICD-10-CM | POA: Diagnosis not present

## 2014-07-25 DIAGNOSIS — K219 Gastro-esophageal reflux disease without esophagitis: Secondary | ICD-10-CM | POA: Diagnosis not present

## 2014-07-25 DIAGNOSIS — Z85820 Personal history of malignant melanoma of skin: Secondary | ICD-10-CM | POA: Diagnosis not present

## 2014-07-25 DIAGNOSIS — Z3202 Encounter for pregnancy test, result negative: Secondary | ICD-10-CM | POA: Diagnosis not present

## 2014-07-25 DIAGNOSIS — Z87442 Personal history of urinary calculi: Secondary | ICD-10-CM | POA: Insufficient documentation

## 2014-07-25 DIAGNOSIS — G43909 Migraine, unspecified, not intractable, without status migrainosus: Secondary | ICD-10-CM | POA: Insufficient documentation

## 2014-07-25 DIAGNOSIS — G40909 Epilepsy, unspecified, not intractable, without status epilepticus: Secondary | ICD-10-CM | POA: Insufficient documentation

## 2014-07-25 DIAGNOSIS — R569 Unspecified convulsions: Secondary | ICD-10-CM

## 2014-07-25 DIAGNOSIS — Y998 Other external cause status: Secondary | ICD-10-CM | POA: Diagnosis not present

## 2014-07-25 DIAGNOSIS — S0083XA Contusion of other part of head, initial encounter: Secondary | ICD-10-CM

## 2014-07-25 DIAGNOSIS — Z793 Long term (current) use of hormonal contraceptives: Secondary | ICD-10-CM | POA: Diagnosis not present

## 2014-07-25 LAB — CBC WITH DIFFERENTIAL/PLATELET
Basophils Absolute: 0 10*3/uL (ref 0.0–0.1)
Basophils Relative: 0 % (ref 0–1)
Eosinophils Absolute: 0.1 10*3/uL (ref 0.0–0.7)
Eosinophils Relative: 1 % (ref 0–5)
HEMATOCRIT: 37.4 % (ref 36.0–46.0)
HEMOGLOBIN: 12.3 g/dL (ref 12.0–15.0)
LYMPHS PCT: 20 % (ref 12–46)
Lymphs Abs: 2.3 10*3/uL (ref 0.7–4.0)
MCH: 28.7 pg (ref 26.0–34.0)
MCHC: 32.9 g/dL (ref 30.0–36.0)
MCV: 87.2 fL (ref 78.0–100.0)
MONO ABS: 0.7 10*3/uL (ref 0.1–1.0)
MONOS PCT: 6 % (ref 3–12)
Neutro Abs: 8.5 10*3/uL — ABNORMAL HIGH (ref 1.7–7.7)
Neutrophils Relative %: 73 % (ref 43–77)
Platelets: 298 10*3/uL (ref 150–400)
RBC: 4.29 MIL/uL (ref 3.87–5.11)
RDW: 13 % (ref 11.5–15.5)
WBC: 11.7 10*3/uL — AB (ref 4.0–10.5)

## 2014-07-25 LAB — URINALYSIS, ROUTINE W REFLEX MICROSCOPIC
BILIRUBIN URINE: NEGATIVE
Glucose, UA: NEGATIVE mg/dL
HGB URINE DIPSTICK: NEGATIVE
Ketones, ur: NEGATIVE mg/dL
Nitrite: NEGATIVE
PH: 5.5 (ref 5.0–8.0)
Protein, ur: NEGATIVE mg/dL
Specific Gravity, Urine: 1.01 (ref 1.005–1.030)
UROBILINOGEN UA: 0.2 mg/dL (ref 0.0–1.0)

## 2014-07-25 LAB — RAPID URINE DRUG SCREEN, HOSP PERFORMED
Amphetamines: NOT DETECTED
Barbiturates: NOT DETECTED
Benzodiazepines: NOT DETECTED
COCAINE: NOT DETECTED
Opiates: NOT DETECTED
TETRAHYDROCANNABINOL: NOT DETECTED

## 2014-07-25 LAB — BASIC METABOLIC PANEL
ANION GAP: 4 — AB (ref 5–15)
BUN: 9 mg/dL (ref 6–23)
CO2: 25 mmol/L (ref 19–32)
CREATININE: 0.67 mg/dL (ref 0.50–1.10)
Calcium: 8.6 mg/dL (ref 8.4–10.5)
Chloride: 106 mmol/L (ref 96–112)
GFR calc Af Amer: >90 mL/min (ref >90)
GLUCOSE: 101 mg/dL — AB (ref 70–99)
Potassium: 3.7 mmol/L (ref 3.5–5.1)
SODIUM: 135 mmol/L (ref 135–145)

## 2014-07-25 LAB — URINE MICROSCOPIC-ADD ON

## 2014-07-25 LAB — PREGNANCY, URINE: Preg Test, Ur: NEGATIVE

## 2014-07-25 LAB — ETHANOL: Alcohol, Ethyl (B): <5 mg/dL (ref 0–9)

## 2014-07-25 MED ORDER — SODIUM CHLORIDE 0.9 % IV BOLUS (SEPSIS)
1000.0000 mL | Freq: Once | INTRAVENOUS | Status: AC
  Administered 2014-07-25: 1000 mL via INTRAVENOUS

## 2014-07-25 MED ORDER — HYDROCODONE-ACETAMINOPHEN 5-325 MG PO TABS: 1.0000 | ORAL_TABLET | ORAL | Status: DC | PRN

## 2014-07-25 MED ORDER — MORPHINE SULFATE 4 MG/ML IJ SOLN
4.0000 mg | Freq: Once | INTRAMUSCULAR | Status: AC
  Administered 2014-07-25: 4 mg via INTRAVENOUS
  Filled 2014-07-25: qty 1

## 2014-07-25 NOTE — ED Notes (Signed)
Pt was able to walk to the restroom to obtain urine sample with assistance. Pt back to room by wheelchair.

## 2014-07-25 NOTE — Discharge Instructions (Signed)
Contusion A contusion is a deep bruise. Contusions are the result of an injury that caused bleeding under the skin. The contusion may turn blue, purple, or yellow. Minor injuries will give you a painless contusion, but more severe contusions may stay painful and swollen for a few weeks.  CAUSES  A contusion is usually caused by a blow, trauma, or direct force to an area of the body. SYMPTOMS   Swelling and redness of the injured area.  Bruising of the injured area.  Tenderness and soreness of the injured area.  Pain. DIAGNOSIS  The diagnosis can be made by taking a history and physical exam. An X-ray, CT scan, or MRI may be needed to determine if there were any associated injuries, such as fractures. TREATMENT  Specific treatment will depend on what area of the body was injured. In general, the best treatment for a contusion is resting, icing, elevating, and applying cold compresses to the injured area. Over-the-counter medicines may also be recommended for pain control. Ask your caregiver what the best treatment is for your contusion. HOME CARE INSTRUCTIONS   Put ice on the injured area.  Put ice in a plastic bag.  Place a towel between your skin and the bag.  Leave the ice on for 15-20 minutes, 3-4 times a day, or as directed by your health care provider.  Only take over-the-counter or prescription medicines for pain, discomfort, or fever as directed by your caregiver. Your caregiver may recommend avoiding anti-inflammatory medicines (aspirin, ibuprofen, and naproxen) for 48 hours because these medicines may increase bruising.  Rest the injured area.  If possible, elevate the injured area to reduce swelling. SEEK IMMEDIATE MEDICAL CARE IF:   You have increased bruising or swelling.  You have pain that is getting worse.  Your swelling or pain is not relieved with medicines. MAKE SURE YOU:   Understand these instructions.  Will watch your condition.  Will get help right  away if you are not doing well or get worse. Document Released: 03/11/2005 Document Revised: 06/06/2013 Document Reviewed: 04/06/2011 [facility] Patient Information 2015 [place], [place]. This information is not intended to replace advice given to you by your health care provider. Make sure you discuss any questions you have with your health care provider.  Driving and Equipment Restrictions Some medical problems make it dangerous to drive, ride a bike, or use machines. Some of these problems are:  A hard blow to the head (concussion).  Passing out (fainting).  Twitching and shaking (seizures).  Low blood sugar.  Taking medicine to help you relax (sedatives).  Taking pain medicines.  Wearing an eye patch.  Wearing splints. This can make it hard to use parts of your body that you need to drive safely. HOME CARE   Do not drive until your doctor says it is okay.  Do not use machines until your doctor says it is okay. You may need a form signed by your doctor (medical release) before you can drive again. You may also need this form before you do other tasks where you need to be fully alert. MAKE SURE YOU:  Understand these instructions.  Will watch your condition.  Will get help right away if you are not doing well or get worse. Document Released: 07/09/2004 Document Revised: 08/24/2011 Document Reviewed: 10/09/2009 [facility] Patient Information 2015 [place], [place]. This information is not intended to replace advice given to you by your health care provider. Make sure you discuss any questions you have with your health care  provider.  Head Injury You have received a head injury. It does not appear serious at this time. Headaches and vomiting are common following head injury. It should be easy to awaken from sleeping. Sometimes it is necessary for you to stay in the emergency department for a while for observation. Sometimes admission to the hospital may be needed. After injuries such  as yours, most problems occur within the first 24 hours, but side effects may occur up to 7-10 days after the injury. It is important for you to carefully monitor your condition and contact your health care provider or seek immediate medical care if there is a change in your condition. WHAT ARE THE TYPES OF HEAD INJURIES? Head injuries can be as minor as a bump. Some head injuries can be more severe. More severe head injuries include:  A jarring injury to the brain (concussion).  A bruise of the brain (contusion). This mean there is bleeding in the brain that can cause swelling.  A cracked skull (skull fracture).  Bleeding in the brain that collects, clots, and forms a bump (hematoma). WHAT CAUSES A HEAD INJURY? A serious head injury is most likely to happen to someone who is in a car wreck and is not wearing a seat belt. Other causes of major head injuries include bicycle or motorcycle accidents, sports injuries, and falls. HOW ARE HEAD INJURIES DIAGNOSED? A complete history of the event leading to the injury and your current symptoms will be helpful in diagnosing head injuries. Many times, pictures of the brain, such as CT or MRI are needed to see the extent of the injury. Often, an overnight hospital stay is necessary for observation.  WHEN SHOULD I SEEK IMMEDIATE MEDICAL CARE?  You should get help right away if:  You have confusion or drowsiness.  You feel sick to your stomach (nauseous) or have continued, forceful vomiting.  You have dizziness or unsteadiness that is getting worse.  You have severe, continued headaches not relieved by medicine. Only take over-the-counter or prescription medicines for pain, fever, or discomfort as directed by your health care provider.  You do not have normal function of the arms or legs or are unable to walk.  You notice changes in the black spots in the center of the colored part of your eye (pupil).  You have a clear or bloody fluid coming from  your nose or ears.  You have a loss of vision. During the next 24 hours after the injury, you must stay with someone who can watch you for the warning signs. This person should contact local emergency services (911 in the [place] if you have seizures, you become unconscious, or you are unable to wake up. HOW CAN I PREVENT A HEAD INJURY IN THE FUTURE? The most important factor for preventing major head injuries is avoiding motor vehicle accidents. To minimize the potential for damage to your head, it is crucial to wear seat belts while riding in motor vehicles. Wearing helmets while bike riding and playing collision sports (like football) is also helpful. Also, avoiding dangerous activities around the house will further help reduce your risk of head injury.  WHEN CAN I RETURN TO NORMAL ACTIVITIES AND ATHLETICS? You should be reevaluated by your health care provider before returning to these activities. If you have any of the following symptoms, you should not return to activities or contact sports until 1 week after the symptoms have stopped:  Persistent headache.  Dizziness or vertigo.  Poor attention and concentration.  Confusion.  Memory problems.  Nausea or vomiting.  Fatigue or tire easily.  Irritability.  Intolerant of bright lights or loud noises.  Anxiety or depression.  Disturbed sleep. MAKE SURE YOU:   Understand these instructions.  Will watch your condition.  Will get help right away if you are not doing well or get worse. Document Released: 06/01/2005 Document Revised: 06/06/2013 Document Reviewed: 02/06/2013 [facility] Patient Information 2015 [place], [place]. This information is not intended to replace advice given to you by your health care provider. Make sure you discuss any questions you have with your health care provider.  Seizure, Adult A seizure is abnormal electrical activity in the brain. Seizures usually last from 30 seconds to 2 minutes. There are  various types of seizures. Before a seizure, you may have a warning sensation (aura) that a seizure is about to occur. An aura may include the following symptoms:   Fear or anxiety.  Nausea.  Feeling like the room is spinning (vertigo).  Vision changes, such as seeing flashing lights or spots. Common symptoms during a seizure include:  A change in attention or behavior (altered mental status).  Convulsions with rhythmic jerking movements.  Drooling.  Rapid eye movements.  Grunting.  Loss of bladder and bowel control.  Bitter taste in the mouth.  Tongue biting. After a seizure, you may feel confused and sleepy. You may also have an injury resulting from convulsions during the seizure. HOME CARE INSTRUCTIONS   If you are given medicines, take them exactly as prescribed by your health care provider.  Keep all follow-up appointments as directed by your health care provider.  Do not swim or drive or engage in risky activity during which a seizure could cause further injury to you or others until your health care provider says it is OK.  Get adequate rest.  Teach friends and family what to do if you have a seizure. They should:  Lay you on the ground to prevent a fall.  Put a cushion under your head.  Loosen any tight clothing around your neck.  Turn you on your side. If vomiting occurs, this helps keep your airway clear.  Stay with you until you recover.  Know whether or not you need emergency care. SEEK IMMEDIATE MEDICAL CARE IF:  The seizure lasts longer than 5 minutes.  The seizure is severe or you do not wake up immediately after the seizure.  You have an altered mental status after the seizure.  You are having more frequent or worsening seizures. Someone should drive you to the emergency department or call local emergency services (911 in [place] MAKE SURE YOU:  Understand these instructions.  Will watch your condition.  Will get help right away if  you are not doing well or get worse. Document Released: 05/29/2000 Document Revised: 03/22/2013 Document Reviewed: 01/11/2013 [facility] Patient Information 2015 [place], [place]. This information is not intended to replace advice given to you by your health care provider. Make sure you discuss any questions you have with your health care provider.

## 2014-07-25 NOTE — ED Provider Notes (Signed)
This chart was scribed for Kreamer, DO by Edison Simon, ED Scribe. This patient was seen in room APA18/APA18   Level 5 Caveat: Altered Mental Status TIME SEEN: 1:12 PM  CHIEF COMPLAINT: altered mental status  HPI: [name] is a 34 y.o. female with history of seizures on Lamictal and Vimpat who presents to the Emergency Department with her husband with concerns of altered mental status. Husband reports the patient was in her normal state of health when she went to work this morning and drove herself to work. He states that when she was at work she may have had a seizure although it was unwitnessed but she did fall and strike her head. She has been confused for several minutes. He states he has not witnessed any of her seizures and cannot describe them. States she is not on anticoagulation. States that she has not had any recent fevers, cough, vomiting or diarrhea. States he is not sure she has missed any doses of her antiepileptics. No known incontinence. No tongue biting.  Neurologist: Dr. Berdine Addison in [place] PCP: Dr. Lavone Neri with [facility]  ROS: Level V caveat for altered mental status   PAST MEDICAL HISTORY/PAST SURGICAL HISTORY:  Past Medical History  Diagnosis Date  . Migraine headache   . IBS (irritable bowel syndrome)   . Pulmonary nodule     previous evaluation - benign  . Allergic rhinitis   . Previous stillbirth or demise, antepartum   . Kidney stones   . Melanoma of hip   . Staphylococcal infection   . Sepsis(995.91)   . Epilepsy   . Acne fulminans   . Petit mal epilepsy   . Breast nodule   . Brain cyst   . Small bowel obstruction   . Cervical polyp   . Menorrhagia   . Abnormal Pap smear of cervix   . Seizures     recently diagnosed with epilepsy  . Shortness of breath     due to wt  . Fibromyalgia   . GERD (gastroesophageal reflux disease)     MEDICATIONS:  Prior to Admission medications   Medication Sig Start Date End Date Taking? Authorizing  Provider  clonazePAM (KLONOPIN) 1 MG tablet Take 0.5-1 tablets by mouth at bedtime.  07/20/14  Yes Historical Provider, MD  FLUoxetine (PROZAC) 20 MG tablet Take 20 mg by mouth every morning. 07/16/14  Yes Historical Provider, MD  lacosamide (VIMPAT) 200 MG TABS Take 200 mg by mouth 2 (two) times daily.   Yes Historical Provider, MD  LamoTRIgine 50 MG TB24 Take 1 tablet by mouth daily.   Yes Historical Provider, MD  Norgestimate-Ethinyl Estradiol Triphasic 0.18/0.215/0.25 MG-25 MCG tab Take 1 tablet by mouth daily.   Yes Historical Provider, MD  omeprazole (PRILOSEC) 20 MG capsule Take 20 mg by mouth daily.  10/30/13  Yes Historical Provider, MD  HYDROcodone-acetaminophen (NORCO) 5-325 MG per tablet Take 1 tablet by mouth every 4 (four) hours as needed. Patient not taking: Reported on 07/25/2014 11/02/13   Julianne Rice, MD  nystatin cream (MYCOSTATIN) Apply 1 application topically 2 (two) times daily. Patient not taking: Reported on 07/25/2014 11/08/13   Lysbeth Penner, FNP  ondansetron (ZOFRAN ODT) 4 MG disintegrating tablet 4mg  ODT q4 hours prn nausea/vomit Patient not taking: Reported on 07/25/2014 11/02/13   Julianne Rice, MD    ALLERGIES:  Allergies  Allergen Reactions  . Amitriptyline Nausea And Vomiting  . Cymbalta [Duloxetine Hcl]     Rectal bleeding  .  Sulfonamide Derivatives Hives    Tremors, red blotches    SOCIAL HISTORY:  History  Substance Use Topics  . Smoking status: Never Smoker   . Smokeless tobacco: Not on file  . Alcohol Use: No    FAMILY HISTORY: Family History  Problem Relation Age of Onset  . Emphysema Paternal Grandmother   . Heart disease Paternal Grandmother   . Emphysema Paternal Grandfather   . Cancer Paternal Grandfather     throat cancer    EXAM: BP 120/71 mmHg  Pulse 84  Temp(Src) 98.8 F (37.1 C) (Oral)  Resp 10  SpO2 99%  LMP  (LMP Unknown) CONSTITUTIONAL: Well-appearing; well-nourished; alert but will not answer questions or follow  commands, tearful HEAD: Normocephalic, atraumatic EYES: Conjunctivae clear, PERRL ENT: normal nose; no rhinorrhea; moist mucous membranes; pharynx without lesions noted, tender to palpation over her mandible bilaterally but no bony deformity or ecchymosis or swelling, no septal hematoma, no dental injury NECK: Supple, no meningismus, no LAD; no midline spinal tenderness or step-off or deformity CARD: RRR; S1 and S2 appreciated; no murmurs, no clicks, no rubs, no gallops RESP: Normal chest excursion without splinting or tachypnea; breath sounds clear and equal bilaterally; no wheezes, no rhonchi, no rales,  ABD/GI: Normal bowel sounds; non-distended; soft, non-tender, no rebound, no guarding BACK:  The back appears normal and is non-tender to palpation, there is no CVA tenderness; no midline spinal tenderness or step-off or deformity EXT: Normal ROM in all joints; non-tender to palpation; no edema; normal capillary refill; no cyanosis    SKIN: Normal color for age and race; warm NEURO: Will not answer questions or follow coommands, her eyes are open, moving upper extremities normally PSYCH: The patient's mood and manner are appropriate. Grooming and personal hygiene are appropriate.  MEDICAL DECISION MAKING: Patient here with possible post ictal state after a possible seizure and head injury. We'll obtain labs, urine, head CT. No obvious sign of trauma on exam. Will continue to closely monitor.  ED PROGRESS: Patient is now more awake, oriented 3, moves all 4 extremities equally, follows commands, reports normal sensation. Reports that she is having bilateral jaw pain which she thinks was from falling to the ground. No obvious sign of trauma on exam.  Her head CT is unremarkable. Labs, urine also unremarkable except urine shows trace leukocytes and few bacteria. No urinary symptoms. Urine culture is pending. Patient does now report that she missed a dose of her antiepileptics yesterday and this may be  the cause of her episode today. Even her new facial pain, will obtain a CT of her face.     CT face no acute abnormalities. She was able to ambulate to the restroom without difficulty. She appears back to her baseline. We'll discharge home. States she has plenty of her antiepileptics at home. Have advised her that she cannot drive until she has been seen and cleared by her neurologist. Discussed this with husband at bedside as well. We'll discharge with hydrocodone for her facial contusions.     EKG Interpretation  Date/Time:  Wednesday July 25 2014 12:07:41 EST Ventricular Rate:  83 PR Interval:  150 QRS Duration: 102 QT Interval:  385 QTC Calculation: 452 R Axis:   19 Text Interpretation:  Sinus rhythm Confirmed by WARD,  DO, KRISTEN [phone]) on 07/25/2014 1:04:50 PM      I personally performed the services described in this documentation, which was scribed in my presence. The recorded information has been reviewed and is accurate.  Tucker, DO 07/25/14 1644

## 2014-07-25 NOTE — ED Notes (Signed)
Pt alert and oriented x 3. Pt medicated for pain.

## 2014-07-25 NOTE — ED Notes (Signed)
Unable to obtain information from patient. Pt keeps repeating, "My husband" and only answers, "I know" when asked questions.

## 2014-07-25 NOTE — ED Notes (Signed)
Pt is more alert at this time, since husband has been at bedside. Husband states that pt has been trying to get out of bed. Pt currently lying in bed at this time. Awaiting EDP assessment.

## 2014-07-25 NOTE — ED Notes (Addendum)
Per EMS, Pt had just come into [employer] (drove to work), states pt was not acting right when she got there. Hx of seizures. Pt fell this morning prior to coming to work. Reported that pt may have hit her head, pt told EMS she landed on "her butt"CBG was 104.

## 2014-07-25 NOTE — ED Notes (Signed)
Pt signed E-sig but computer froze and was unable to get it to come back up with pt here. Screen was locked and unable to get in from another location. Pt verbalized understanding of d/c instructions.

## 2014-07-27 LAB — URINE CULTURE
Colony Count: NO GROWTH
Culture: NO GROWTH

## 2015-05-24 IMAGING — US US BREAST*L* LIMITED INC AXILLA
1 series · 4 of 4 positions shown · non-contrast
Comparison: 09/09/2010 and prior ultrasounds 08/30/2009

CLINICAL DATA: 33-year-old female with nipple pain and followup
left breast mass.

EXAM:
ULTRASOUND OF THE LEFT BREAST

[Series 1: advbreast · 4 of 4 slices shown]
[im 1/4]
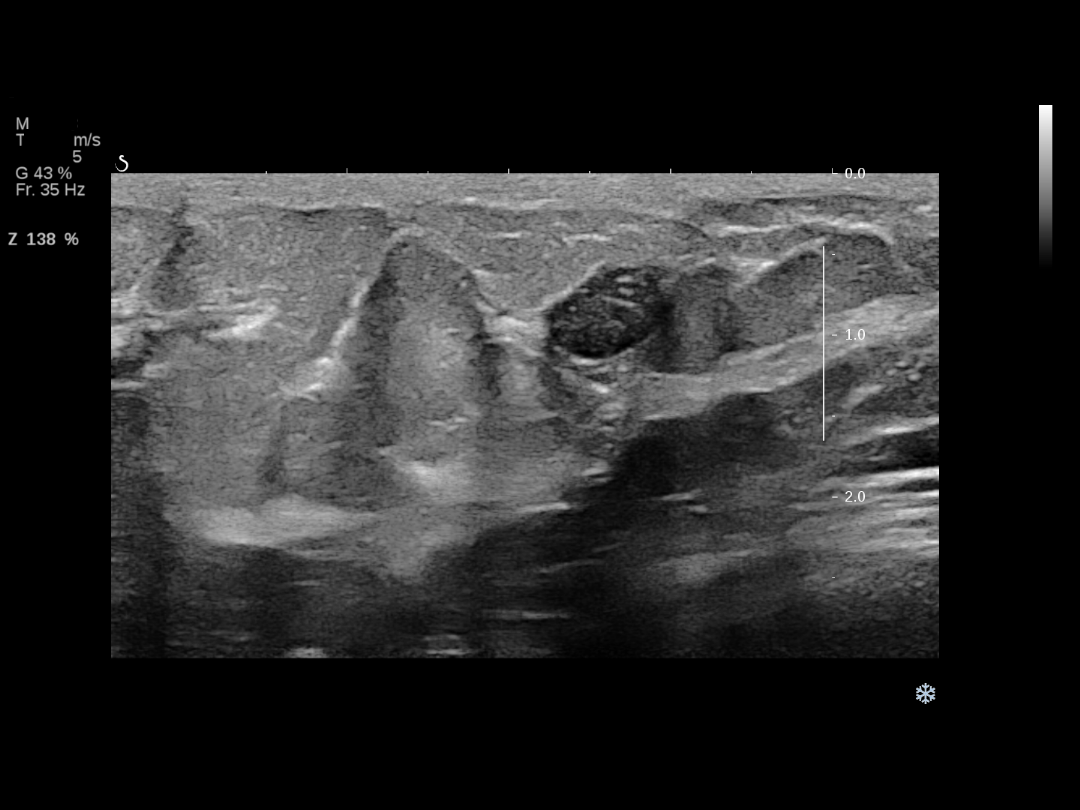
[im 2/4]
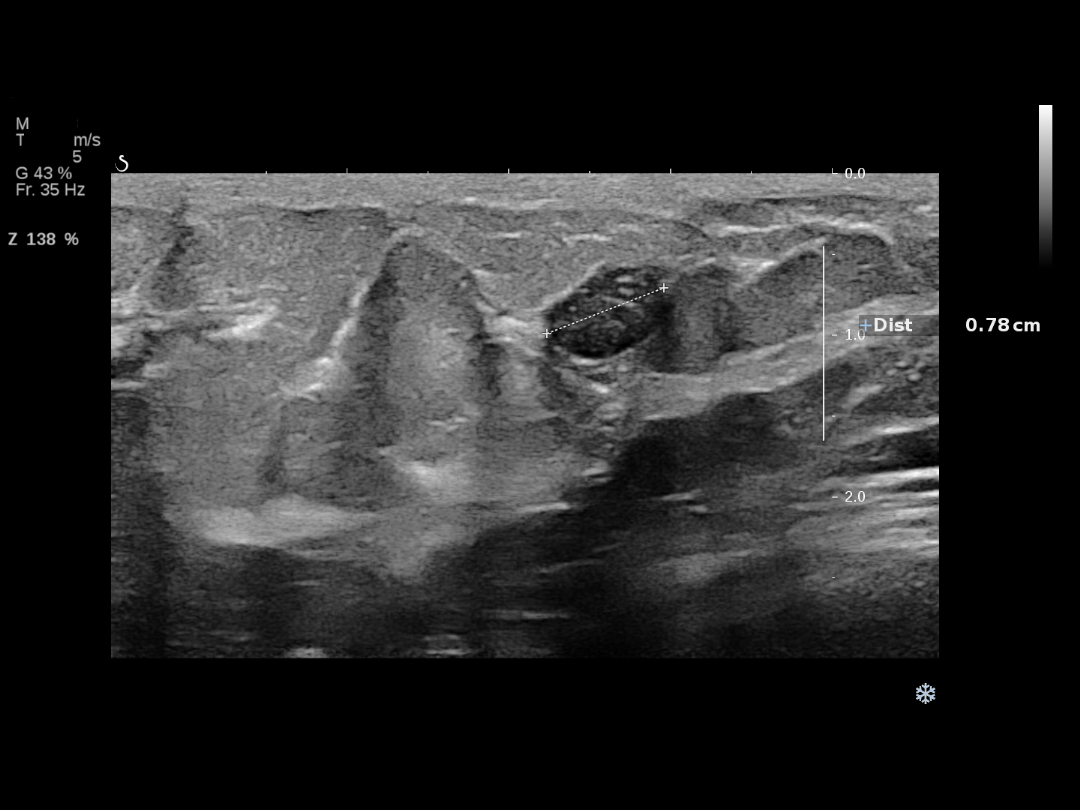
[im 3/4]
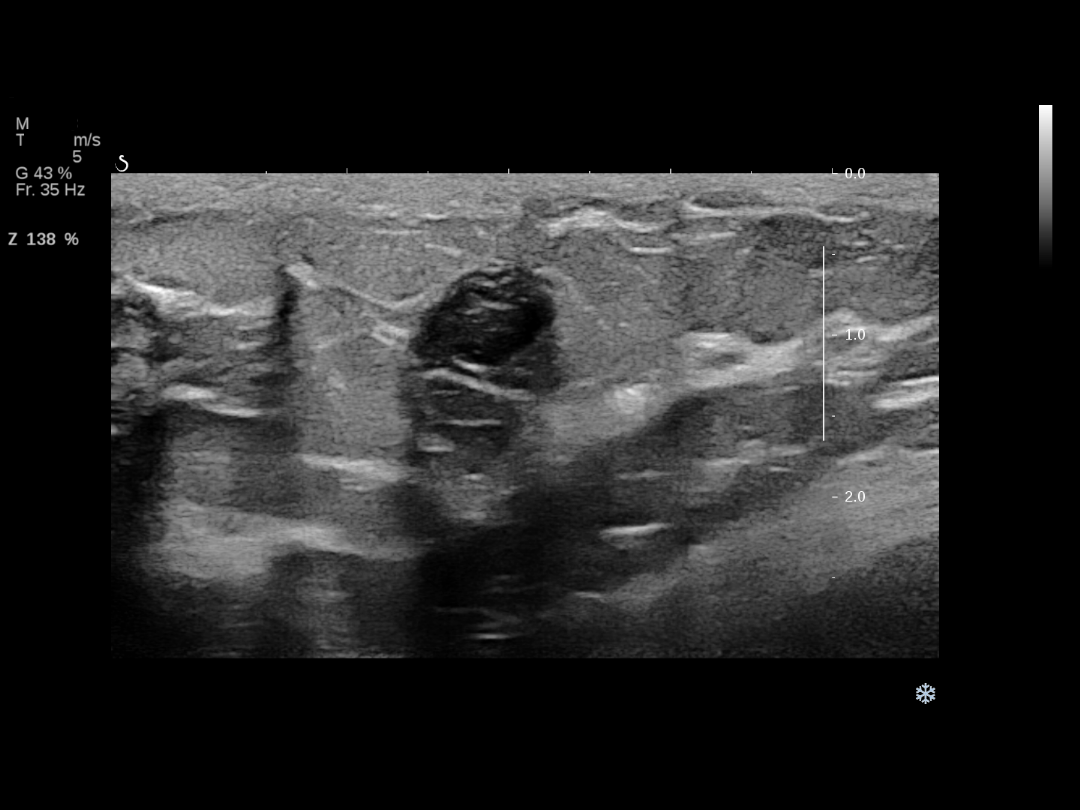
[im 4/4]
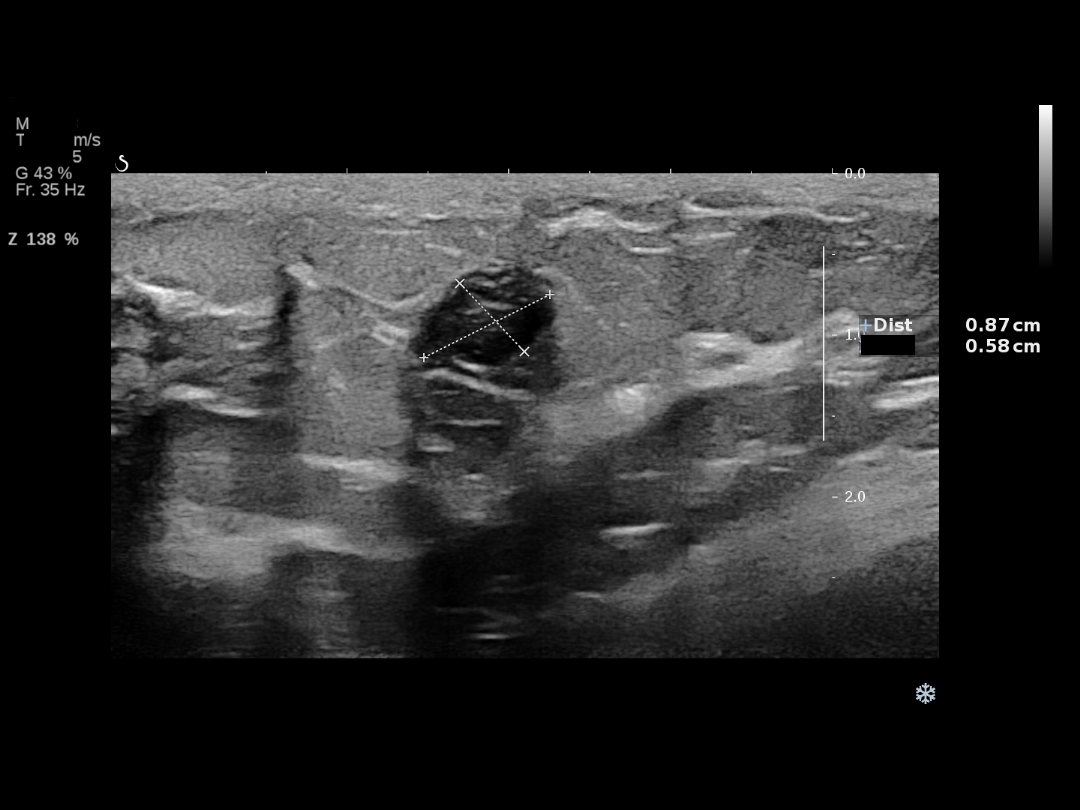

[4 of 4 positions shown; findings below may reference images not displayed]

FINDINGS: On physical exam, mild thickening is identified at the 7 o'clock
position of the left breast 2 cm from the nipple. No palpable
retroareolar abnormalities are noted. The left nipple appears
unremarkable and symmetric to the right.

Ultrasound is performed, showing a 9 x 6 x 8 mm circumscribed oval
hypoechoic parallel mass at the 7 o'clock position of the left
breast 2 cm from the nipple -unchanged from 0790 and compatible with
a benign fibroadenoma.

There is no evidence of abnormality in the retroareolar left breast.
IMPRESSION: Stable fibroadenoma in the lower inner left breast.

No evidence of left nipple or retroareolar breast abnormality.

RECOMMENDATION:
Bilateral screening mammograms at age 40.

I have discussed the findings, causes of breast and recommendations
with the patient. Results were also provided in writing at the
conclusion of the visit. If applicable, a reminder letter will be
sent to the patient regarding the next appointment.

BI-RADS CATEGORY  2: Benign Finding(s)

## 2015-07-19 DIAGNOSIS — E559 Vitamin D deficiency, unspecified: Secondary | ICD-10-CM | POA: Insufficient documentation

## 2016-08-18 ENCOUNTER — Ambulatory Visit (INDEPENDENT_AMBULATORY_CARE_PROVIDER_SITE_OTHER): Payer: BLUE CROSS/BLUE SHIELD | Admitting: Family Medicine

## 2016-08-18 ENCOUNTER — Encounter: Payer: Self-pay | Admitting: Family Medicine

## 2016-08-18 ENCOUNTER — Ambulatory Visit (INDEPENDENT_AMBULATORY_CARE_PROVIDER_SITE_OTHER): Payer: BLUE CROSS/BLUE SHIELD

## 2016-08-18 VITALS — BP 133/84 | HR 88 | Temp 97.1°F | Ht 62.0 in | Wt 227.1 lb

## 2016-08-18 DIAGNOSIS — M25561 Pain in right knee: Secondary | ICD-10-CM

## 2016-08-18 MED ORDER — METHYLPREDNISOLONE ACETATE 80 MG/ML IJ SUSP: 80.0000 mg | Freq: Once | INTRAMUSCULAR | Status: DC

## 2016-08-18 NOTE — Progress Notes (Signed)
BP 133/84   Pulse 88   Temp 97.1 F (36.2 C) (Oral)   Ht 5\' 2"  (1.575 m)   Wt 227 lb 2 oz (103 kg)   BMI 41.54 kg/m    Subjective:    Patient ID: [name], female    DOB: 1981-03-23, 35 y.o.   MRN: [id number]  HPI: [name] is a 36 y.o. female presenting on 08/18/2016 for Knee Pain (right knee, began about 2 months ago; 2 days ago was lifting something heavy at work, turned and sharp pain shot through knee)   HPI Right knee pain Patient has been having right knee pain is been going on for the past 2 months but worsened over the past 2 days when she was lifting something heavy at work and twisted and turned and a sharp pain shot through that right knee and she has had swelling and inflammation in that right knee. She denies any fevers or chills or redness or warmth. She feels like the knee is ready to give out on her and she has a lot of difficulty walking with it.  Relevant past medical, surgical, family and social history reviewed and updated as indicated. Interim medical history since our last visit reviewed. Allergies and medications reviewed and updated.  Review of Systems  Constitutional: Negative for chills and fever.  Respiratory: Negative for chest tightness and shortness of breath.   Cardiovascular: Negative for chest pain and leg swelling.  Musculoskeletal: Positive for arthralgias and joint swelling. Negative for back pain and gait problem.  Skin: Negative for color change and rash.  Neurological: Negative for light-headedness and headaches.  Psychiatric/Behavioral: Negative for agitation and behavioral problems.  All other systems reviewed and are negative.   Per HPI unless specifically indicated above     Objective:    BP 133/84   Pulse 88   Temp 97.1 F (36.2 C) (Oral)   Ht 5\' 2"  (1.575 m)   Wt 227 lb 2 oz (103 kg)   BMI 41.54 kg/m   Wt Readings from Last 3 Encounters:  08/18/16 227 lb 2 oz (103 kg)  11/01/13 196 lb (88.9 kg)  11/01/13  208 lb 12.8 oz (94.7 kg)    Physical Exam  Constitutional: She is oriented to person, place, and time. She appears well-developed and well-nourished. No distress.  Eyes: Conjunctivae are normal.  Cardiovascular: Regular rhythm.   Musculoskeletal: Normal range of motion. She exhibits no edema.       Right knee: She exhibits swelling, effusion and abnormal meniscus. She exhibits normal range of motion, no erythema, normal alignment, no LCL laxity, normal patellar mobility, no bony tenderness and no MCL laxity. Tenderness found. Medial joint line tenderness noted.  Neurological: She is alert and oriented to person, place, and time. Coordination normal.  Skin: Skin is warm and dry. No rash noted. She is not diaphoretic.  Psychiatric: She has a normal mood and affect. Her behavior is normal.  Nursing note and vitals reviewed.   X-ray right knee: Severe medial compartmental disease on the right side, await final read by radiologist.  Knee injection: Risk factors of bleeding and infection discussed with patient and patient is agreeable towards injection. Patient prepped with Betadine. Lateral approach towards injection used. Injected 80 mg of Depo-Medrol and 1 mL of 2% lidocaine. Patient tolerated procedure well and no side effects from noted. Minimal to no bleeding. Simple bandage applied after.     Assessment & Plan:   Problem List Items Addressed  This Visit    None    Visit Diagnoses    Acute pain of right knee    -  Primary   Severe medial compartment disease on the right knee, on exam likely has meniscal tear as well   Relevant Medications   methylPREDNISolone acetate (DEPO-MEDROL) injection 80 mg (Start on 08/18/2016  5:30 PM)   Other Relevant Orders   DG Knee 1-2 Views Right   Ambulatory referral to Orthopedic Surgery       Follow up plan: Return if symptoms worsen or fail to improve.  Counseling provided for all of the vaccine components Orders Placed This Encounter  Procedures    . DG Knee 1-2 Views Right  . Ambulatory referral to [place] Dettinger, MD [place] 08/18/2016, 5:17 PM

## 2016-08-20 ENCOUNTER — Telehealth: Payer: Self-pay | Admitting: Family Medicine

## 2016-08-20 DIAGNOSIS — S83241A Other tear of medial meniscus, current injury, right knee, initial encounter: Secondary | ICD-10-CM

## 2016-08-20 NOTE — Telephone Encounter (Signed)
Per pt, she can still not put any pressure on knee She feels like knee is giving out, even while wearing brace  Please advise Ortho appt is scheduled for 08/31/2016

## 2016-08-20 NOTE — Telephone Encounter (Signed)
What symptoms do you have? Knee pain  How long have you been sick? Seen on Tuesday 08/18/2016 by Dr. Warrick Parisian got cortizone shot on outside of right knee most pain is inside of knee wants to know if she can get cortizone shot inside knee   Have you been seen for this problem? Yes ortho appt is on 08/31/2016   If your provider decides to give you a prescription, which pharmacy would you like for it to be sent to? If something is prescribed for pain please send to [facility]   Patient informed that this information will be sent to the clinical staff for review and that they should receive a follow up call.

## 2016-08-24 ENCOUNTER — Telehealth: Payer: Self-pay | Admitting: Family Medicine

## 2016-08-24 NOTE — Telephone Encounter (Signed)
What type of referral do you need? MRI of knee  Have you been seen at our office for this problem? Yes last week by Dr. Warrick Parisian (If no, schedule them an appointment.  They will need to be seen before a referral can be done.)  Is there a particular doctor or location that you prefer? Pt has not heard back about MRI appt date or time   Patient notified that referrals can take up to a week or longer to process. If they haven't heard anything within a week they should call back and speak with the referral department.

## 2016-08-24 NOTE — Telephone Encounter (Signed)
Not authorized yet

## 2016-09-02 ENCOUNTER — Ambulatory Visit (HOSPITAL_COMMUNITY)
Admission: RE | Admit: 2016-09-02 | Discharge: 2016-09-02 | Disposition: A | Discharge status: Home / Self Care | Payer: BLUE CROSS/BLUE SHIELD | Source: Ambulatory Visit | Attending: Family Medicine | Admitting: Family Medicine

## 2016-09-02 DIAGNOSIS — M25561 Pain in right knee: Secondary | ICD-10-CM | POA: Diagnosis present

## 2016-09-02 DIAGNOSIS — M2241 Chondromalacia patellae, right knee: Secondary | ICD-10-CM | POA: Diagnosis not present

## 2016-09-02 DIAGNOSIS — S83241A Other tear of medial meniscus, current injury, right knee, initial encounter: Secondary | ICD-10-CM

## 2016-09-14 NOTE — Telephone Encounter (Signed)
MRI was performed and results are back

## 2016-10-20 ENCOUNTER — Ambulatory Visit: Payer: BLUE CROSS/BLUE SHIELD | Attending: Specialist | Admitting: Physical Therapy

## 2016-10-20 DIAGNOSIS — M25661 Stiffness of right knee, not elsewhere classified: Secondary | ICD-10-CM | POA: Diagnosis present

## 2016-10-20 DIAGNOSIS — G8929 Other chronic pain: Secondary | ICD-10-CM | POA: Insufficient documentation

## 2016-10-20 DIAGNOSIS — M25561 Pain in right knee: Secondary | ICD-10-CM | POA: Insufficient documentation

## 2016-10-20 DIAGNOSIS — R600 Localized edema: Secondary | ICD-10-CM | POA: Insufficient documentation

## 2016-10-20 NOTE — Patient Instructions (Signed)
Instructed patient in right quad sets; SLR and heel slides.

## 2016-10-20 NOTE — Therapy (Signed)
[facility] [place], [place], [address] Phone: [phone]   Fax:  [phone]  Physical Therapy Evaluation  Patient Details  Name: [name] MRN: [id number] Date of Birth: 04/26/1981 Referring Provider: Hart Robinsons MD  Encounter Date: 10/20/2016      PT End of Session - 10/20/16 2054    Date for PT Re-Evaluation 12/01/16      Past Medical History:  Diagnosis Date  . Abnormal Pap smear of cervix   . Acne fulminans   . Allergic rhinitis   . Brain cyst   . Breast nodule   . Cervical polyp   . Epilepsy ([place])   . Fibromyalgia   . GERD (gastroesophageal reflux disease)   . IBS (irritable bowel syndrome)   . Kidney stones   . Melanoma of hip ([place])   . Menorrhagia   . Migraine headache   . 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 Previous stillbirth or demise, antepartum   . Pulmonary nodule    previous evaluation - benign  . Seizures ([place])    recently diagnosed with epilepsy  . Sepsis(995.91)   . Shortness of breath    due to wt  . Small bowel obstruction   . Staphylococcal infection     Past Surgical History:  Procedure Laterality Date  . BREAST LUMPECTOMY  2008   right breast - benign; needle - localized excision of right breast mass x2  . caesarean section    . HYSTEROSCOPY W/D&C  04/27/2011   Procedure: DILATATION AND CURETTAGE (D&C) /HYSTEROSCOPY;  Surgeon: Thurnell Lose, MD;  Location: [facility];  Service: Gynecology;  Laterality: N/A;  . MELANOMA EXCISION    . svd     x 1 - stillborn at 36 weeks    There were no vitals filed for this visit.       Subjective Assessment - 10/20/16 2026    Subjective The patient presents s/p right knee arthroscopic surgery performed on 10/13/16.  The patient reports a long h/o right knee pain dating back to sports injuries years ago.     Limitations Walking   How long can you walk comfortably? Short distances.   Currently in Pain? Yes   Pain Score 7    Pain Location  Knee   Pain Orientation Right   Pain Descriptors / Indicators Aching   Pain Onset In the past 7 days   Pain Frequency Constant   Aggravating Factors  Increased up time.   Pain Relieving Factors rest and ice.            [facility] PT Assessment - 10/20/16 0001      Assessment   Medical Diagnosis Right knee scope/debridement.   Referring Provider Hart Robinsons MD   Onset Date/Surgical Date --  10/13/16     Precautions   Precautions --  Pain-free right quadriceps strengthening.     Restrictions   Weight Bearing Restrictions Yes     Balance Screen   Has the patient fallen in the past 6 months No   Has the patient had a decrease in activity level because of a fear of falling?  No   Is the patient reluctant to leave their home because of a fear of falling?  No     Home Environment   Living Environment Private residence     Prior Function   Level of Independence Independent     Observation/Other Assessments   Observations Bandaids intact.  Expected ecchymosis as well.  Observation/Other Assessments-Edema    Edema Circumferential     Circumferential Edema   Circumferential - Right Right 1 cm > left.     ROM / Strength   AROM / PROM / Strength AROM;Strength     AROM   Overall AROM Comments Full right knee extension in supine with active-assistive flexion to 70 degrees.     Strength   Overall Strength Comments Right hip strength= 2+ to 3-/5.     Palpation   Palpation comment Tender to even light palpation all around the patient right kneecap.     Ambulation/Gait   Gait Comments Very antalgic gait currently with patient minimizing weight bearing over her right LE.                   [facility] Adult PT Treatment/Exercise - 10/20/16 0001      Modalities   Modalities Electrical Stimulation;Vasopneumatic     Electrical Stimulation   Electrical Stimulation Location Right knee.   Electrical Stimulation Action IFC   Electrical Stimulation Parameters 1-10 Hz x  15 minuts.   Electrical Stimulation Goals Edema;Pain                PT Education - 10/20/16 2033    Education provided Yes   Education Details HEP.   Person(s) Educated Patient   Methods Explanation;Demonstration   Comprehension Verbalized understanding;Returned demonstration             PT Long Term Goals - 10/20/16 2054      PT LONG TERM GOAL #1   Title Independent with a HEP.   Time 6   Period Weeks   Status New     PT LONG TERM GOAL #2   Title Active right knee flexion to 120-125 degrees+ so the patient can perform functional tasks and do so with pain not > 2-3/10.   Time 6   Period Weeks   Status New     PT LONG TERM GOAL #3   Title Increase right knee strength to a solid 5/5 to provide good stability for accomplishment of functional activities   Time 6   Period Weeks   Status New     PT LONG TERM GOAL #4   Title Perform a reciprocating stair gait with one railing with pain not > 2-3/10.   Time 6   Period Weeks   Status New               Plan - 10/20/16 2049    Clinical Impression Statement The patient presents to [facility] s/p right knee arthroscopic surgery performed on 10/13/16.  She currently has limited strength and range of motion which is impairing her functional mobility and performance of ADL's.  Patient will benefit from skilled physical therapy.   Rehab Potential Excellent   PT Frequency 3x / week   PT Duration 4 weeks   PT Treatment/Interventions ADLs/Self Care Home Management;Cryotherapy;Electrical Stimulation;Gait training;Stair training;Functional mobility training;Therapeutic activities;Therapeutic exercise;Neuromuscular re-education;Patient/family education;Manual techniques;Vasopneumatic Device   PT Next Visit Plan Nustep; QS (try VMS); SLR; SAQ's; begin with gentle right knee range of motion; vasopneumatic and electrical stimulation.  PAIN-FREE right quadriceps strengthening.   Consulted and Agree with Plan of Care Patient       Patient will benefit from skilled therapeutic intervention in order to improve the following deficits and impairments:  Abnormal gait, Pain, Decreased strength, Decreased mobility, Decreased range of motion, Difficulty walking  Visit Diagnosis: Chronic pain of right knee - Plan: PT plan of care cert/re-cert  Localized edema - Plan: PT plan of care cert/re-cert  Stiffness of right knee, not elsewhere classified - Plan: PT plan of care cert/re-cert     Problem List Patient Active Problem List   Diagnosis Date Noted  . PALPITATIONS 06/12/2010  . CHEST PAIN, PRECORDIAL 06/12/2010  . MIGRAINE HEADACHE 06/11/2010  . IBS 06/11/2010  . PULMONARY NODULE 09/17/2009  . ALLERGIC RHINITIS 09/11/2009    Aradhya Shellenbarger, [place] MPT 10/20/2016, 9:00 PM  [facility] [place], [place], [address] Phone: [phone]   Fax:  [phone]  Name: CARLA RASHAD MRN: [id number] Date of Birth: 06/26/1980

## 2016-10-22 ENCOUNTER — Ambulatory Visit: Payer: BLUE CROSS/BLUE SHIELD | Admitting: *Deleted

## 2016-10-22 DIAGNOSIS — M25561 Pain in right knee: Principal | ICD-10-CM

## 2016-10-22 DIAGNOSIS — R600 Localized edema: Secondary | ICD-10-CM

## 2016-10-22 DIAGNOSIS — G8929 Other chronic pain: Secondary | ICD-10-CM

## 2016-10-22 DIAGNOSIS — M25661 Stiffness of right knee, not elsewhere classified: Secondary | ICD-10-CM

## 2016-10-22 NOTE — Therapy (Signed)
[facility] [place], [place], [address] Phone: [phone]   Fax:  [phone]  Physical Therapy Treatment  Patient Details  Name: [name] MRN: [id number] Date of Birth: 05/20/1981 Referring Provider: Hart Robinsons MD  Encounter Date: 10/22/2016      PT End of Session - 10/22/16 1043    Visit Number 2   Number of Visits 12   Date for PT Re-Evaluation 12/01/16   PT Start Time 0945   PT Stop Time 1036   PT Time Calculation (min) 51 min      Past Medical History:  Diagnosis Date  . Abnormal Pap smear of cervix   . Acne fulminans   . Allergic rhinitis   . Brain cyst   . Breast nodule   . Cervical polyp   . Epilepsy ([place])   . Fibromyalgia   . GERD (gastroesophageal reflux disease)   . IBS (irritable bowel syndrome)   . Kidney stones   . Melanoma of hip ([place])   . Menorrhagia   . Migraine headache   . 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 Previous stillbirth or demise, antepartum   . Pulmonary nodule    previous evaluation - benign  . Seizures ([place])    recently diagnosed with epilepsy  . Sepsis(995.91)   . Shortness of breath    due to wt  . Small bowel obstruction   . Staphylococcal infection     Past Surgical History:  Procedure Laterality Date  . BREAST LUMPECTOMY  2008   right breast - benign; needle - localized excision of right breast mass x2  . caesarean section    . HYSTEROSCOPY W/D&C  04/27/2011   Procedure: DILATATION AND CURETTAGE (D&C) /HYSTEROSCOPY;  Surgeon: Thurnell Lose, MD;  Location: [facility];  Service: Gynecology;  Laterality: N/A;  . MELANOMA EXCISION    . svd     x 1 - stillborn at 36 weeks    There were no vitals filed for this visit.      Subjective Assessment - 10/22/16 0952    Subjective The patient presents s/p right knee arthroscopic surgery performed on 10/13/16.  The patient reports a long h/o right knee pain dating back to sports injuries years ago.     Limitations  Walking   How long can you walk comfortably? Short distances.   Currently in Pain? Yes   Pain Score 7    Pain Location Knee   Pain Orientation Right   Pain Descriptors / Indicators Aching   Pain Onset In the past 7 days   Pain Frequency Constant                         [facility] Adult PT Treatment/Exercise - 10/22/16 0001      Exercises   Exercises Knee/Hip     Knee/Hip Exercises: Aerobic   Nustep L3 x16 mins for ROM     Knee/Hip Exercises: Seated   Long Arc Quad Right;2 sets;10 reps     Knee/Hip Exercises: Supine   Quad Sets AROM;Right;2 sets;10 reps   Short Arc Quad Sets Right;2 sets;10 reps   Heel Slides AROM;Right;1 set;10 reps   Straight Leg Raises AROM;Right;1 set;10 reps  very challenging     Modalities   Modalities Location manager Stimulation Location Right knee. IFC x 1-10hz  x 15 mins   Electrical Stimulation Goals Edema;Pain     Vasopneumatic  Number Minutes Vasopneumatic  15 minutes   Vasopnuematic Location  Knee   Vasopneumatic Pressure Low                PT Education - 10/22/16 1042    Education provided Yes   Education Details Quad activation   Person(s) Educated Patient   Methods Explanation;Demonstration;Handout   Comprehension Verbalized understanding;Returned demonstration             PT Long Term Goals - 10/20/16 2054      PT LONG TERM GOAL #1   Title Independent with a HEP.   Time 6   Period Weeks   Status New     PT LONG TERM GOAL #2   Title Active right knee flexion to 120-125 degrees+ so the patient can perform functional tasks and do so with pain not > 2-3/10.   Time 6   Period Weeks   Status New     PT LONG TERM GOAL #3   Title Increase right knee strength to a solid 5/5 to provide good stability for accomplishment of functional activities   Time 6   Period Weeks   Status New     PT LONG TERM GOAL #4   Title Perform a reciprocating  stair gait with one railing with pain not > 2-3/10.   Time 6   Period Weeks   Status New               Plan - 10/22/16 1043    Clinical Impression Statement Pt arrived ambulating with 2 crutches. She was able to do fairly well on nustep for ROM and was able to reach 85 degrees. She is still challenged with quad activation and SLR, but did fair with SAQ and LAQ. fatigued after Rxc. Normal response to modalities. HEP given   Rehab Potential Excellent   PT Frequency 3x / week   PT Duration 4 weeks   PT Treatment/Interventions ADLs/Self Care Home Management;Cryotherapy;Electrical Stimulation;Gait training;Stair training;Functional mobility training;Therapeutic activities;Therapeutic exercise;Neuromuscular re-education;Patient/family education;Manual techniques;Vasopneumatic Device   PT Next Visit Plan Nustep; QS (try VMS); SLR; SAQ's; begin with gentle right knee range of motion; vasopneumatic and electrical stimulation.  PAIN-FREE right quadriceps strengthening.   Consulted and Agree with Plan of Care Patient      Patient will benefit from skilled therapeutic intervention in order to improve the following deficits and impairments:  Abnormal gait, Pain, Decreased strength, Decreased mobility, Decreased range of motion, Difficulty walking  Visit Diagnosis: Chronic pain of right knee  Stiffness of right knee, not elsewhere classified  Localized edema     Problem List Patient Active Problem List   Diagnosis Date Noted  . PALPITATIONS 06/12/2010  . CHEST PAIN, PRECORDIAL 06/12/2010  . MIGRAINE HEADACHE 06/11/2010  . IBS 06/11/2010  . PULMONARY NODULE 09/17/2009  . ALLERGIC RHINITIS 09/11/2009    RAMSEUR,CHRIS, PTA 10/22/2016, 11:06 AM  [facility] [place], [place], [address] Phone: [phone]   Fax:  [phone]  Name: GISSELA BLOCH MRN: [id number] Date of Birth: 03/28/1981

## 2016-10-22 NOTE — Patient Instructions (Signed)
Quad Set   Slowly tighten muscles on thigh of straight leg while counting out loud to __3 seconds__. Repeat with other leg. Repeat _10-15___ times. Do __2-3__ sessions per day.  Strengthening: Straight Leg Raise (Phase 1)   Tighten muscles on front of right thigh, then lift leg _12___ inches from surface, keeping knee locked.  Repeat __10-15__ times per set. Do _3___ sets per session. Do _2-3___ sessions per day.   Hip Adduction: Leg Lift (Eccentric) - Side-Lying   Lie on side with top leg bent, foot flat behind lower leg. Quickly lift lower leg. Slowly lower for 3-5 seconds. _10-15__ reps per set, 3 sets,  _2-3__ sets per day.   Abduction: Side Leg Lift (Eccentric) - Side-Lying   Lie on side. Lift top leg slightly higher than shoulder level. Keep top leg straight with body, toes pointing forward. Slowly lower for 3-5 seconds. _10-15__ reps per set, 3 sets,  _2-3__ sets per day.

## 2016-10-27 ENCOUNTER — Encounter: Payer: Self-pay | Admitting: Physical Therapy

## 2016-10-27 ENCOUNTER — Ambulatory Visit: Payer: BLUE CROSS/BLUE SHIELD | Admitting: Physical Therapy

## 2016-10-27 DIAGNOSIS — M25661 Stiffness of right knee, not elsewhere classified: Secondary | ICD-10-CM

## 2016-10-27 DIAGNOSIS — R600 Localized edema: Secondary | ICD-10-CM

## 2016-10-27 DIAGNOSIS — G8929 Other chronic pain: Secondary | ICD-10-CM

## 2016-10-27 DIAGNOSIS — M25561 Pain in right knee: Secondary | ICD-10-CM | POA: Diagnosis not present

## 2016-10-27 NOTE — Therapy (Signed)
[facility] [place], [place], [address] Phone: [phone]   Fax:  [phone]  Physical Therapy Treatment  Patient Details  Name: [name] MRN: [id number] Date of Birth: 1981/02/03 Referring Provider: Hart Robinsons MD  Encounter Date: 10/27/2016      PT End of Session - 10/27/16 1005    Visit Number 3   Number of Visits 12   Date for PT Re-Evaluation 12/01/16   PT Start Time 1005   PT Stop Time 1051   PT Time Calculation (min) 46 min   Activity Tolerance Patient tolerated treatment well   Behavior During Therapy [facility] for tasks assessed/performed      Past Medical History:  Diagnosis Date  . Abnormal Pap smear of cervix   . Acne fulminans   . Allergic rhinitis   . Brain cyst   . Breast nodule   . Cervical polyp   . Epilepsy ([place])   . Fibromyalgia   . GERD (gastroesophageal reflux disease)   . IBS (irritable bowel syndrome)   . Kidney stones   . Melanoma of hip ([place])   . Menorrhagia   . Migraine headache   . 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 Previous stillbirth or demise, antepartum   . Pulmonary nodule    previous evaluation - benign  . Seizures ([place])    recently diagnosed with epilepsy  . Sepsis(995.91)   . Shortness of breath    due to wt  . Small bowel obstruction ([place])   . Staphylococcal infection     Past Surgical History:  Procedure Laterality Date  . BREAST LUMPECTOMY  2008   right breast - benign; needle - localized excision of right breast mass x2  . caesarean section    . HYSTEROSCOPY W/D&C  04/27/2011   Procedure: DILATATION AND CURETTAGE (D&C) /HYSTEROSCOPY;  Surgeon: Thurnell Lose, MD;  Location: [facility];  Service: Gynecology;  Laterality: N/A;  . MELANOMA EXCISION    . svd     x 1 - stillborn at 36 weeks    There were no vitals filed for this visit.      Subjective Assessment - 10/27/16 1004    Subjective Reports that she is now able to walk flat footed. Reports that she  was doing something and felt like her knee caught. Reports her knee feels raw upon waking in the mornings and states that she does toss and turn when she is able to sleep which she isn't able to do much of right now. Reports that her incision opened recently and bled some but stopped.   Limitations Walking   How long can you walk comfortably? Short distances.   Currently in Pain? Yes   Pain Score 6    Pain Location Knee   Pain Orientation Right   Pain Descriptors / Indicators Discomfort;Aching   Pain Type Surgical pain   Pain Onset In the past 7 days   Pain Frequency Constant            [facility] PT Assessment - 10/27/16 0001      Assessment   Medical Diagnosis Right knee scope/debridement.   Onset Date/Surgical Date 10/13/16   Next MD Visit 11/11/2016     Restrictions   Weight Bearing Restrictions Yes                     [facility] Adult PT Treatment/Exercise - 10/27/16 0001      Knee/Hip Exercises: Aerobic   Nustep L4,  seat 8, x10 min     Knee/Hip Exercises: Standing   Rocker Board 1 minute     Knee/Hip Exercises: Supine   Short Arc Quad Sets Right;2 sets;10 reps   Heel Slides AROM;Other (comment)  x2 reps but stopped secondary to pain   Straight Leg Raises AAROM;Right;1 set;10 reps     Modalities   Modalities Teacher, early years/pre Action IFC   Electrical Stimulation Parameters 1-10 hz x15 min   Electrical Stimulation Goals Edema;Pain     Vasopneumatic   Number Minutes Vasopneumatic  15 minutes   Vasopnuematic Location  Knee   Vasopneumatic Pressure Medium   Vasopneumatic Temperature  44                     PT Long Term Goals - 10/20/16 2054      PT LONG TERM GOAL #1   Title Independent with a HEP.   Time 6   Period Weeks   Status New     PT LONG TERM GOAL #2   Title Active right knee flexion to 120-125 degrees+ so the  patient can perform functional tasks and do so with pain not > 2-3/10.   Time 6   Period Weeks   Status New     PT LONG TERM GOAL #3   Title Increase right knee strength to a solid 5/5 to provide good stability for accomplishment of functional activities   Time 6   Period Weeks   Status New     PT LONG TERM GOAL #4   Title Perform a reciprocating stair gait with one railing with pain not > 2-3/10.   Time 6   Period Weeks   Status New               Plan - 10/27/16 1116    Clinical Impression Statement Patient continues to experience R Quad weakness and continued R knee pain. Tightness reported by patient in R calf, HS and also in posterior knee. R calf stretch not ellicited per patient report with standing rockerboard stretch. Very weak R quad activation with R SAQ AROM and assist required from PTA. Heel slides and SLR were reported as painful per patient. Normal modalities response noted following removal of the modalities.   Rehab Potential Excellent   PT Frequency 3x / week   PT Duration 4 weeks   PT Treatment/Interventions ADLs/Self Care Home Management;Cryotherapy;Electrical Stimulation;Gait training;Stair training;Functional mobility training;Therapeutic activities;Therapeutic exercise;Neuromuscular re-education;Patient/family education;Manual techniques;Vasopneumatic Device   PT Next Visit Plan Continue with R painfree quad strengthening with pain management modalities per MPT POC. Consider VMS to R Quad next treatment.   Consulted and Agree with Plan of Care Patient      Patient will benefit from skilled therapeutic intervention in order to improve the following deficits and impairments:  Abnormal gait, Pain, Decreased strength, Decreased mobility, Decreased range of motion, Difficulty walking  Visit Diagnosis: Chronic pain of right knee  Stiffness of right knee, not elsewhere classified  Localized edema     Problem List Patient Active Problem List    Diagnosis Date Noted  . PALPITATIONS 06/12/2010  . CHEST PAIN, PRECORDIAL 06/12/2010  . MIGRAINE HEADACHE 06/11/2010  . IBS 06/11/2010  . PULMONARY NODULE 09/17/2009  . ALLERGIC RHINITIS 09/11/2009    Wynelle Fanny, PTA 10/27/2016, 12:15 PM  [facility] [place], [place], [address] Phone:  [phone]   Fax:  [phone]  Name: DANEISHA SURGES MRN: [id number] Date of Birth: 04-20-1981

## 2016-10-29 ENCOUNTER — Encounter: Payer: Self-pay | Admitting: Physical Therapy

## 2016-10-29 ENCOUNTER — Ambulatory Visit: Payer: BLUE CROSS/BLUE SHIELD | Admitting: Physical Therapy

## 2016-10-29 DIAGNOSIS — R600 Localized edema: Secondary | ICD-10-CM

## 2016-10-29 DIAGNOSIS — M25561 Pain in right knee: Principal | ICD-10-CM

## 2016-10-29 DIAGNOSIS — M25661 Stiffness of right knee, not elsewhere classified: Secondary | ICD-10-CM

## 2016-10-29 DIAGNOSIS — G8929 Other chronic pain: Secondary | ICD-10-CM

## 2016-10-29 NOTE — Therapy (Signed)
[facility] [place], [place], [address] Phone: [phone]   Fax:  [phone]  Physical Therapy Treatment  Patient Details  Name: [name] MRN: [id number] Date of Birth: [birth year] Referring Provider: Hart Robinsons MD  Encounter Date: 10/29/2016      PT End of Session - 10/29/16 1107    Visit Number 4   Number of Visits 12   Date for PT Re-Evaluation 12/01/16   PT Start Time 1031   PT Stop Time 1117   PT Time Calculation (min) 46 min   Activity Tolerance Patient tolerated treatment well   Behavior During Therapy [facility] for tasks assessed/performed      Past Medical History:  Diagnosis Date  . Abnormal Pap smear of cervix   . Acne fulminans   . Allergic rhinitis   . Brain cyst   . Breast nodule   . Cervical polyp   . Epilepsy ([place])   . Fibromyalgia   . GERD (gastroesophageal reflux disease)   . IBS (irritable bowel syndrome)   . Kidney stones   . Melanoma of hip ([place])   . Menorrhagia   . Migraine headache   . 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 Previous stillbirth or demise, antepartum   . Pulmonary nodule    previous evaluation - benign  . Seizures ([place])    recently diagnosed with epilepsy  . Sepsis(995.91)   . Shortness of breath    due to wt  . Small bowel obstruction ([place])   . Staphylococcal infection     Past Surgical History:  Procedure Laterality Date  . BREAST LUMPECTOMY  2008   right breast - benign; needle - localized excision of right breast mass x2  . caesarean section    . HYSTEROSCOPY W/D&C  04/27/2011   Procedure: DILATATION AND CURETTAGE (D&C) /HYSTEROSCOPY;  Surgeon: Thurnell Lose, MD;  Location: [facility];  Service: Gynecology;  Laterality: N/A;  . MELANOMA EXCISION    . svd     x 1 - stillborn at 36 weeks    There were no vitals filed for this visit.      Subjective Assessment - 10/29/16 1034    Subjective Patient reported some ongoing pain in knee, did well after last  treatment   Limitations Walking   How long can you walk comfortably? Short distances.   Currently in Pain? Yes   Pain Score 6    Pain Location Knee   Pain Orientation Right   Pain Descriptors / Indicators Aching;Sore   Pain Type Surgical pain   Pain Onset In the past 7 days   Pain Frequency Constant   Aggravating Factors  prolong weight bearing   Pain Relieving Factors rest and ice            [facility] PT Assessment - 10/29/16 0001      ROM / Strength   AROM / PROM / Strength AROM;PROM     AROM   AROM Assessment Site Knee   Right/Left Knee Right   Right Knee Flexion 84     PROM   PROM Assessment Site Knee   Right/Left Knee Right   Right Knee Extension 90                     [facility] Adult PT Treatment/Exercise - 10/29/16 0001      Knee/Hip Exercises: Aerobic   Nustep L4, seat 8, x10 min     Knee/Hip Exercises: Land  2 minutes     Knee/Hip Exercises: Seated   Long Arc Quad Strengthening;Right;1 set;10 reps  with ball squeeze     Knee/Hip Exercises: Supine   Advice worker  with VMS to VMO/Quad 10/10 x39min to activatie and Programmer, multimedia [employer]   Electrical Stimulation Parameters 1-10hz x96min   Electrical Stimulation Goals Edema;Pain     Vasopneumatic   Number Minutes Vasopneumatic  15 minutes   Vasopnuematic Location  Knee   Vasopneumatic Pressure Medium                     PT Long Term Goals - 10/29/16 1112      PT LONG TERM GOAL #1   Title Independent with a HEP.   Time 6   Period Weeks   Status On-going     PT LONG TERM GOAL #2   Title Active right knee flexion to 120-125 degrees+ so the patient can perform functional tasks and do so with pain not > 2-3/10.   Time 6   Period Weeks   Status On-going     PT LONG TERM GOAL #3   Title Increase right knee  strength to a solid 5/5 to provide good stability for accomplishment of functional activities   Time 6   Period Weeks   Status On-going     PT LONG TERM GOAL #4   Title Perform a reciprocating stair gait with one railing with pain not > 2-3/10.   Time 6   Period Weeks   Status On-going               Plan - 10/29/16 1113    Clinical Impression Statement Patient tolerated treatment well today. Patient has increased soreness and weakness with prolong standing. Patient did well with VMS today and able to activate quad with quad sets. Patient progressing toward goals and able to improve right knee flexion today. Goals ongoing due to strength, pain and ROM deficts.    Rehab Potential Excellent   PT Frequency 3x / week   PT Duration 4 weeks   PT Treatment/Interventions ADLs/Self Care Home Management;Cryotherapy;Electrical Stimulation;Gait training;Stair training;Functional mobility training;Therapeutic activities;Therapeutic exercise;Neuromuscular re-education;Patient/family education;Manual techniques;Vasopneumatic Device   PT Next Visit Plan Continue with R painfree quad strengthening with pain management modalities per MPT POC. assess VMS to R Quad next treatment.   Consulted and Agree with Plan of Care Patient      Patient will benefit from skilled therapeutic intervention in order to improve the following deficits and impairments:  Abnormal gait, Pain, Decreased strength, Decreased mobility, Decreased range of motion, Difficulty walking  Visit Diagnosis: Chronic pain of right knee  Stiffness of right knee, not elsewhere classified  Localized edema     Problem List Patient Active Problem List   Diagnosis Date Noted  . PALPITATIONS 06/12/2010  . CHEST PAIN, PRECORDIAL 06/12/2010  . MIGRAINE HEADACHE 06/11/2010  . IBS 06/11/2010  . PULMONARY NODULE 09/17/2009  . ALLERGIC RHINITIS 09/11/2009    Phillips Climes, PTA 10/29/2016, 11:39 AM  [facility] [place], [place], [address] Phone: [phone]   Fax:  [phone]  Name: [name] MRN: [id number] Date of Birth: 1980-11-01

## 2016-11-03 ENCOUNTER — Ambulatory Visit: Payer: BLUE CROSS/BLUE SHIELD | Admitting: *Deleted

## 2016-11-03 DIAGNOSIS — M25561 Pain in right knee: Principal | ICD-10-CM

## 2016-11-03 DIAGNOSIS — R600 Localized edema: Secondary | ICD-10-CM

## 2016-11-03 DIAGNOSIS — M25661 Stiffness of right knee, not elsewhere classified: Secondary | ICD-10-CM

## 2016-11-03 DIAGNOSIS — G8929 Other chronic pain: Secondary | ICD-10-CM

## 2016-11-03 NOTE — Therapy (Signed)
[facility] [place], [place], [address] Phone: [phone]   Fax:  [phone]  Physical Therapy Treatment  Patient Details  Name: [name] MRN: [id number] Date of Birth: 05-17-1981 Referring Provider: Hart Robinsons MD  Encounter Date: 11/03/2016      PT End of Session - 11/03/16 1253    Visit Number 5   Number of Visits 12   Date for PT Re-Evaluation 12/01/16   PT Start Time 1031   PT Stop Time 4627   PT Time Calculation (min) 60 min      Past Medical History:  Diagnosis Date  . Abnormal Pap smear of cervix   . Acne fulminans   . Allergic rhinitis   . Brain cyst   . Breast nodule   . Cervical polyp   . Epilepsy ([place])   . Fibromyalgia   . GERD (gastroesophageal reflux disease)   . IBS (irritable bowel syndrome)   . Kidney stones   . Melanoma of hip ([place])   . Menorrhagia   . Migraine headache   . 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 Previous stillbirth or demise, antepartum   . Pulmonary nodule    previous evaluation - benign  . Seizures ([place])    recently diagnosed with epilepsy  . Sepsis(995.91)   . Shortness of breath    due to wt  . Small bowel obstruction ([place])   . Staphylococcal infection     Past Surgical History:  Procedure Laterality Date  . BREAST LUMPECTOMY  2008   right breast - benign; needle - localized excision of right breast mass x2  . caesarean section    . HYSTEROSCOPY W/D&C  04/27/2011   Procedure: DILATATION AND CURETTAGE (D&C) /HYSTEROSCOPY;  Surgeon: Thurnell Lose, MD;  Location: [facility];  Service: Gynecology;  Laterality: N/A;  . MELANOMA EXCISION    . svd     x 1 - stillborn at 36 weeks    There were no vitals filed for this visit.      Subjective Assessment - 11/03/16 1038    Subjective Patient reported some ongoing pain in knee, did well after last treatment. Using one crutch    Limitations Walking   How long can you walk comfortably? Short distances.   Currently  in Pain? Yes   Pain Score 6    Pain Location Knee   Pain Orientation Right   Pain Descriptors / Indicators Aching                                      PT Long Term Goals - 10/29/16 1112      PT LONG TERM GOAL #1   Title Independent with a HEP.   Time 6   Period Weeks   Status On-going     PT LONG TERM GOAL #2   Title Active right knee flexion to 120-125 degrees+ so the patient can perform functional tasks and do so with pain not > 2-3/10.   Time 6   Period Weeks   Status On-going     PT LONG TERM GOAL #3   Title Increase right knee strength to a solid 5/5 to provide good stability for accomplishment of functional activities   Time 6   Period Weeks   Status On-going     PT LONG TERM GOAL #4   Title Perform a reciprocating stair gait with one railing  with pain not > 2-3/10.   Time 6   Period Weeks   Status On-going               Plan - 11/03/16 1040    Clinical Impression Statement Pt arrived to clinic today amb. with 1 crutch. She still  is having pain with ambulation and has a fear of falling. Rx focused on Quad activation with CKC and OKC with VMS. HEP for WB quad activation given.   PT Frequency 3x / week   PT Duration 4 weeks   PT Treatment/Interventions ADLs/Self Care Home Management;Cryotherapy;Electrical Stimulation;Gait training;Stair training;Functional mobility training;Therapeutic activities;Therapeutic exercise;Neuromuscular re-education;Patient/family education;Manual techniques;Vasopneumatic Device   PT Next Visit Plan Continue with R painfree quad strengthening with pain management modalities per MPT POC. assess VMS to R Quad next treatment.   Consulted and Agree with Plan of Care Patient      Patient will benefit from skilled therapeutic intervention in order to improve the following deficits and impairments:  Abnormal gait, Pain, Decreased strength, Decreased mobility, Decreased range of motion, Difficulty  walking  Visit Diagnosis: Chronic pain of right knee  Stiffness of right knee, not elsewhere classified  Localized edema     Problem List Patient Active Problem List   Diagnosis Date Noted  . PALPITATIONS 06/12/2010  . CHEST PAIN, PRECORDIAL 06/12/2010  . MIGRAINE HEADACHE 06/11/2010  . IBS 06/11/2010  . PULMONARY NODULE 09/17/2009  . ALLERGIC RHINITIS 09/11/2009    Virna Livengood,CHRIS, PTA 11/03/2016, 12:56 PM  [facility] [address] [place], [place], [address] Phone: [phone]   Fax:  [phone]  Name: MEGHANNE PLETZ MRN: [id number] Date of Birth: Oct 28, 1980

## 2016-11-05 ENCOUNTER — Ambulatory Visit: Payer: BLUE CROSS/BLUE SHIELD | Admitting: Physical Therapy

## 2016-11-05 ENCOUNTER — Encounter: Payer: Self-pay | Admitting: Physical Therapy

## 2016-11-05 DIAGNOSIS — M25661 Stiffness of right knee, not elsewhere classified: Secondary | ICD-10-CM

## 2016-11-05 DIAGNOSIS — G8929 Other chronic pain: Secondary | ICD-10-CM

## 2016-11-05 DIAGNOSIS — M25561 Pain in right knee: Principal | ICD-10-CM

## 2016-11-05 DIAGNOSIS — R600 Localized edema: Secondary | ICD-10-CM

## 2016-11-05 NOTE — Therapy (Signed)
[facility] [place], [place], [address] Phone: [phone]   Fax:  [phone]  Physical Therapy Treatment  Patient Details  Name: [name] MRN: [id number] Date of Birth: [birth year] Referring Provider: Hart Robinsons MD  Encounter Date: 11/05/2016      PT End of Session - 11/05/16 1410    Visit Number 6   Number of Visits 12   Date for PT Re-Evaluation 12/01/16   PT Start Time 1130   PT Stop Time 1205   PT Time Calculation (min) 35 min   Activity Tolerance Patient tolerated treatment well   Behavior During Therapy [facility] for tasks assessed/performed      Past Medical History:  Diagnosis Date  . Abnormal Pap smear of cervix   . Acne fulminans   . Allergic rhinitis   . Brain cyst   . Breast nodule   . Cervical polyp   . Epilepsy ([place])   . Fibromyalgia   . GERD (gastroesophageal reflux disease)   . IBS (irritable bowel syndrome)   . Kidney stones   . Melanoma of hip ([place])   . Menorrhagia   . Migraine headache   . 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 Previous stillbirth or demise, antepartum   . Pulmonary nodule    previous evaluation - benign  . Seizures ([place])    recently diagnosed with epilepsy  . Sepsis(995.91)   . Shortness of breath    due to wt  . Small bowel obstruction ([place])   . Staphylococcal infection     Past Surgical History:  Procedure Laterality Date  . BREAST LUMPECTOMY  2008   right breast - benign; needle - localized excision of right breast mass x2  . caesarean section    . HYSTEROSCOPY W/D&C  04/27/2011   Procedure: DILATATION AND CURETTAGE (D&C) /HYSTEROSCOPY;  Surgeon: Thurnell Lose, MD;  Location: [facility];  Service: Gynecology;  Laterality: N/A;  . MELANOMA EXCISION    . svd     x 1 - stillborn at 36 weeks    There were no vitals filed for this visit.      Subjective Assessment - 11/05/16 1358    Subjective Pt arriving to therapy 15 minutes late. Pt reported distal quad  cramping and mild pain on R LE.    Limitations Walking;House hold activities   How long can you walk comfortably? Short distances.   Currently in Pain? Yes   Pain Score 5    Pain Location Knee   Pain Orientation Right   Pain Descriptors / Indicators Aching   Pain Type Surgical pain   Pain Onset In the past 7 days   Pain Frequency Constant   Aggravating Factors  prolong standing   Pain Relieving Factors rest and ice                         [facility] Adult PT Treatment/Exercise - 11/05/16 0001      Exercises   Exercises Knee/Hip     Knee/Hip Exercises: Aerobic   Nustep L4 x 5 minutes  using no UE's     Knee/Hip Exercises: Standing   Heel Raises Both;15 reps   Hip Flexion Stengthening;15 reps   Hip Abduction Stengthening;Right;15 reps   Hip Extension Stengthening;15 reps   Rocker Board 2 minutes   Other Standing Knee Exercises Pregait exs forWB in //bars   with RT knee in the front and back for quad facilitation  Knee/Hip Exercises: Supine   Quad Sets Strengthening  with VMS to VMO/Quad 10/10 x34min to activatie and strengthe     Modalities   Modalities Location manager Stimulation Location Rt Knee IFC x 15 mins, and VMS x 10 mins   Electrical Stimulation Goals Tone;Pain     Vasopneumatic   Number Minutes Vasopneumatic  15 minutes   Vasopnuematic Location  Knee   Vasopneumatic Pressure Medium   Vasopneumatic Temperature  40                PT Education - 11/05/16 1409    Education provided Yes   Education Details Walking program   Person(s) Educated Patient   Methods Explanation   Comprehension Verbalized understanding             PT Long Term Goals - 11/05/16 1416      PT LONG TERM GOAL #1   Title Independent with a HEP.   Time 6   Period Weeks   Status On-going     PT LONG TERM GOAL #2   Title Active right knee flexion to 120-125 degrees+ so the patient can  perform functional tasks and do so with pain not > 2-3/10.   Time 6   Period Weeks   Status On-going     PT LONG TERM GOAL #3   Title Increase right knee strength to a solid 5/5 to provide good stability for accomplishment of functional activities   Time 6   Period Weeks   Status On-going     PT LONG TERM GOAL #4   Title Perform a reciprocating stair gait with one railing with pain not > 2-3/10.   Time 6   Period Weeks   Status On-going             Patient will benefit from skilled therapeutic intervention in order to improve the following deficits and impairments:     Visit Diagnosis: Chronic pain of right knee  Stiffness of right knee, not elsewhere classified  Localized edema     Problem List Patient Active Problem List   Diagnosis Date Noted  . PALPITATIONS 06/12/2010  . CHEST PAIN, PRECORDIAL 06/12/2010  . MIGRAINE HEADACHE 06/11/2010  . IBS 06/11/2010  . PULMONARY NODULE 09/17/2009  . ALLERGIC RHINITIS 09/11/2009    Oretha Caprice, MPT 11/05/2016, 2:17 PM  [facility] [address] [place], [place], [address] Phone: [phone]   Fax:  [phone]  Name: RHESA FORSBERG MRN: [id number] Date of Birth: 11-04-1980

## 2016-11-05 NOTE — Therapy (Signed)
[facility] [place], [place], [address] Phone: [phone]   Fax:  [phone]  Physical Therapy Treatment  Patient Details  Name: [name] MRN: [id number] Date of Birth: [birth year] Referring Provider: Hart Robinsons MD  Encounter Date: 11/03/2016    Past Medical History:  Diagnosis Date  . Abnormal Pap smear of cervix   . Acne fulminans   . Allergic rhinitis   . Brain cyst   . Breast nodule   . Cervical polyp   . Epilepsy ([place])   . Fibromyalgia   . GERD (gastroesophageal reflux disease)   . IBS (irritable bowel syndrome)   . Kidney stones   . Melanoma of hip ([place])   . Menorrhagia   . Migraine headache   . 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 Previous stillbirth or demise, antepartum   . Pulmonary nodule    previous evaluation - benign  . Seizures ([place])    recently diagnosed with epilepsy  . Sepsis(995.91)   . Shortness of breath    due to wt  . Small bowel obstruction ([place])   . Staphylococcal infection     Past Surgical History:  Procedure Laterality Date  . BREAST LUMPECTOMY  2008   right breast - benign; needle - localized excision of right breast mass x2  . caesarean section    . HYSTEROSCOPY W/D&C  04/27/2011   Procedure: DILATATION AND CURETTAGE (D&C) /HYSTEROSCOPY;  Surgeon: Thurnell Lose, MD;  Location: [facility];  Service: Gynecology;  Laterality: N/A;  . MELANOMA EXCISION    . svd     x 1 - stillborn at 36 weeks    There were no vitals filed for this visit.                       [facility] Adult PT Treatment/Exercise - 11/05/16 0001      Electrical Stimulation   Electrical Stimulation Location R knee                     PT Long Term Goals - 10/29/16 1112      PT LONG TERM GOAL #1   Title Independent with a HEP.   Time 6   Period Weeks   Status On-going     PT LONG TERM GOAL #2   Title Active right knee flexion to 120-125 degrees+ so the patient can  perform functional tasks and do so with pain not > 2-3/10.   Time 6   Period Weeks   Status On-going     PT LONG TERM GOAL #3   Title Increase right knee strength to a solid 5/5 to provide good stability for accomplishment of functional activities   Time 6   Period Weeks   Status On-going     PT LONG TERM GOAL #4   Title Perform a reciprocating stair gait with one railing with pain not > 2-3/10.   Time 6   Period Weeks   Status On-going             Patient will benefit from skilled therapeutic intervention in order to improve the following deficits and impairments:  Abnormal gait, Pain, Decreased strength, Decreased mobility, Decreased range of motion, Difficulty walking  Visit Diagnosis: Chronic pain of right knee  Stiffness of right knee, not elsewhere classified  Localized edema     Problem List Patient Active Problem List   Diagnosis Date Noted  . PALPITATIONS 06/12/2010  . CHEST  PAIN, PRECORDIAL 06/12/2010  . MIGRAINE HEADACHE 06/11/2010  . IBS 06/11/2010  . PULMONARY NODULE 09/17/2009  . ALLERGIC RHINITIS 09/11/2009    Valissa Lyvers,CHRIS 11/05/2016, 12:25 PM  [facility] [address] [place], [place], [address] Phone: [phone]   Fax:  [phone]  Name: [name] MRN: [id number] Date of Birth: 10/31/1980

## 2016-11-12 ENCOUNTER — Ambulatory Visit: Payer: BLUE CROSS/BLUE SHIELD | Admitting: Physical Therapy

## 2016-11-12 ENCOUNTER — Encounter: Payer: Self-pay | Admitting: Physical Therapy

## 2016-11-12 DIAGNOSIS — M25661 Stiffness of right knee, not elsewhere classified: Secondary | ICD-10-CM

## 2016-11-12 DIAGNOSIS — M25561 Pain in right knee: Principal | ICD-10-CM

## 2016-11-12 DIAGNOSIS — G8929 Other chronic pain: Secondary | ICD-10-CM

## 2016-11-12 DIAGNOSIS — R600 Localized edema: Secondary | ICD-10-CM

## 2016-11-12 NOTE — Therapy (Signed)
[facility] [place], [place], [address] Phone: [phone]   Fax:  [phone]  Physical Therapy Treatment  Patient Details  Name: [name] MRN: [id number] Date of Birth: 04-May-1981 Referring Provider: Hart Robinsons MD  Encounter Date: 11/12/2016      PT End of Session - 11/12/16 1249    Visit Number 7   Number of Visits 12   Date for PT Re-Evaluation 12/01/16   PT Start Time 1030   PT Stop Time 1120   PT Time Calculation (min) 50 min   Equipment Utilized During Treatment Gait belt   Activity Tolerance Patient tolerated treatment well   Behavior During Therapy [facility] for tasks assessed/performed      Past Medical History:  Diagnosis Date  . Abnormal Pap smear of cervix   . Acne fulminans   . Allergic rhinitis   . Brain cyst   . Breast nodule   . Cervical polyp   . Epilepsy ([place])   . Fibromyalgia   . GERD (gastroesophageal reflux disease)   . IBS (irritable bowel syndrome)   . Kidney stones   . Melanoma of hip ([place])   . Menorrhagia   . Migraine headache   . 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 Previous stillbirth or demise, antepartum   . Pulmonary nodule    previous evaluation - benign  . Seizures ([place])    recently diagnosed with epilepsy  . Sepsis(995.91)   . Shortness of breath    due to wt  . Small bowel obstruction ([place])   . Staphylococcal infection     Past Surgical History:  Procedure Laterality Date  . BREAST LUMPECTOMY  2008   right breast - benign; needle - localized excision of right breast mass x2  . caesarean section    . HYSTEROSCOPY W/D&C  04/27/2011   Procedure: DILATATION AND CURETTAGE (D&C) /HYSTEROSCOPY;  Surgeon: Thurnell Lose, MD;  Location: [facility];  Service: Gynecology;  Laterality: N/A;  . MELANOMA EXCISION    . svd     x 1 - stillborn at 36 weeks    There were no vitals filed for this visit.      Subjective Assessment - 11/12/16 1044    Subjective pt arriving to  therapy complaining of 4/10 R knee pain. Pt reporting she wants to start walking without her crutch. Pt still reporting sensitivity over incision site.    Limitations Walking;House hold activities   How long can you walk comfortably? Short distances.   Currently in Pain? Yes   Pain Score 4    Pain Location Knee   Pain Orientation Right   Pain Descriptors / Indicators Aching   Pain Type Surgical pain   Pain Onset In the past 7 days   Pain Frequency Constant   Aggravating Factors  prolonged standing, walking long distances   Pain Relieving Factors rest and ice                         [facility] Adult PT Treatment/Exercise - 11/12/16 0001      Ambulation/Gait   Ambulation/Gait Yes   Ambulation/Gait Assistance 6: Modified independent (Device/Increase time);4: Min guard   Ambulation Distance (Feet) 50 Feet  x 4   Assistive device L Axillary Crutch   Gait Pattern Step-to pattern;Antalgic   Ambulation Surface Level;Indoor   Gait Comments Amb with no device, pt was instructed in heel to toe gait pattern and equalized step length. Pt also  instructed in step through. Pt's spouse was also edu in proper gait techniques.      Exercises   Exercises Knee/Hip     Knee/Hip Exercises: Aerobic   Nustep L4 x 17 minutes  using bilateral UE's     Knee/Hip Exercises: Machines for Strengthening   Cybex Leg Press 1 plate bilateral LE's 30 times     Knee/Hip Exercises: Standing   Heel Raises 15 reps   Hip Flexion Stengthening;15 reps   Hip Abduction Stengthening;15 reps   Hip Extension Stengthening;15 reps   Other Standing Knee Exercises Standing hamstring curls x 15     Modalities   Modalities Electrical Stimulation     Electrical Stimulation   Electrical Stimulation Location Rt Knee IFC x 15 mins, and VMS x 10 mins   Electrical Stimulation Goals Tone;Pain     Vasopneumatic   Number Minutes Vasopneumatic  15 minutes   Vasopnuematic Location  Knee   Vasopneumatic Pressure  Medium   Vasopneumatic Temperature  39                PT Education - 11/12/16 1244    Education provided Yes   Education Details Gait training with and without crutch   Person(s) Educated Patient;Spouse   Methods Explanation;Demonstration   Comprehension Verbalized understanding;Returned demonstration             PT Long Term Goals - 11/12/16 1412      PT LONG TERM GOAL #1   Title Independent with a HEP.   Time 6   Period Weeks   Status Achieved     PT LONG TERM GOAL #2   Title Active right knee flexion to 120-125 degrees+ so the patient can perform functional tasks and do so with pain not > 2-3/10.   Time 6   Period Weeks   Status On-going     PT LONG TERM GOAL #3   Title Increase right knee strength to a solid 5/5 to provide good stability for accomplishment of functional activities   Period Weeks   Status On-going     PT LONG TERM GOAL #4   Title Perform a reciprocating stair gait with one railing with pain not > 2-3/10.   Status On-going               Plan - 11/12/16 1249    Clinical Impression Statement Patient arrived to clinic today amb with 1 crutch. Pt still having sensitivity over her incision site. Pt still reporting pain of 4/10 in her right knee. Pt reported MD kept pt out of work for a few more weeks. Pt tolerated increased time on Nustep today. Pt also did well on Leg Press. Pt also tolerated gait training with no crutch for 50 foot intervals with CGA to close supervision with gait belt working on progressing equalized step length and heel to toe gait patter with step through gait. Pt's spouse was also educated on proper gait traiing. Pt was instructed to try stort distance amb around her home with no crutch. Pt sitll advised to use her single crutch for longer distance amb and community amb. Pt reportin 1/10 pain at end of session.    Clinical Presentation Stable   Rehab Potential Excellent   PT Frequency 3x / week   PT Duration 4 weeks   New order from Hart Robinsons, MD on 11/11/16   PT Treatment/Interventions ADLs/Self Care Home Management;Cryotherapy;Electrical Stimulation;Gait training;Stair training;Functional mobility training;Therapeutic activities;Therapeutic exercise;Neuromuscular re-education;Patient/family education;Manual techniques;Vasopneumatic Device   PT Next  Visit Plan Continue with R quad strengthening with pain management modalities per MPT POC, gait training with no device   Consulted and Agree with Plan of Care Patient      Patient will benefit from skilled therapeutic intervention in order to improve the following deficits and impairments:  Abnormal gait, Pain, Decreased strength, Decreased mobility, Decreased range of motion, Difficulty walking  Visit Diagnosis: Chronic pain of right knee  Stiffness of right knee, not elsewhere classified  Localized edema     Problem List Patient Active Problem List   Diagnosis Date Noted  . PALPITATIONS 06/12/2010  . CHEST PAIN, PRECORDIAL 06/12/2010  . MIGRAINE HEADACHE 06/11/2010  . IBS 06/11/2010  . PULMONARY NODULE 09/17/2009  . ALLERGIC RHINITIS 09/11/2009    Oretha Caprice, MPT 11/12/2016, 2:18 PM  [facility] [address] [place], [place], [address] Phone: [phone]   Fax:  [phone]  Name: [name] MRN: [id number] Date of Birth: 1980-11-21

## 2016-11-13 ENCOUNTER — Ambulatory Visit: Payer: BLUE CROSS/BLUE SHIELD | Attending: Specialist | Admitting: Physical Therapy

## 2016-11-13 ENCOUNTER — Encounter: Payer: Self-pay | Admitting: Physical Therapy

## 2016-11-13 DIAGNOSIS — G8929 Other chronic pain: Secondary | ICD-10-CM

## 2016-11-13 DIAGNOSIS — M25561 Pain in right knee: Secondary | ICD-10-CM | POA: Insufficient documentation

## 2016-11-13 DIAGNOSIS — R600 Localized edema: Secondary | ICD-10-CM | POA: Diagnosis present

## 2016-11-13 DIAGNOSIS — M25661 Stiffness of right knee, not elsewhere classified: Secondary | ICD-10-CM

## 2016-11-13 NOTE — Therapy (Signed)
[facility] [place], [place], [address] Phone: [phone]   Fax:  [phone]  Physical Therapy Treatment  Patient Details  Name: [name] MRN: [id number] Date of Birth: May 02, 1981 Referring Provider: Hart Robinsons MD  Encounter Date: 11/13/2016      PT End of Session - 11/13/16 0910    Visit Number 8   Number of Visits 12   Date for PT Re-Evaluation 12/01/16   PT Start Time 0818   PT Stop Time 0910   PT Time Calculation (min) 52 min   Activity Tolerance Patient tolerated treatment well   Behavior During Therapy [facility] for tasks assessed/performed      Past Medical History:  Diagnosis Date  . Abnormal Pap smear of cervix   . Acne fulminans   . Allergic rhinitis   . Brain cyst   . Breast nodule   . Cervical polyp   . Epilepsy ([place])   . Fibromyalgia   . GERD (gastroesophageal reflux disease)   . IBS (irritable bowel syndrome)   . Kidney stones   . Melanoma of hip ([place])   . Menorrhagia   . Migraine headache   . 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 Previous stillbirth or demise, antepartum   . Pulmonary nodule    previous evaluation - benign  . Seizures ([place])    recently diagnosed with epilepsy  . Sepsis(995.91)   . Shortness of breath    due to wt  . Small bowel obstruction ([place])   . Staphylococcal infection     Past Surgical History:  Procedure Laterality Date  . BREAST LUMPECTOMY  2008   right breast - benign; needle - localized excision of right breast mass x2  . caesarean section    . HYSTEROSCOPY W/D&C  04/27/2011   Procedure: DILATATION AND CURETTAGE (D&C) /HYSTEROSCOPY;  Surgeon: Thurnell Lose, MD;  Location: [facility];  Service: Gynecology;  Laterality: N/A;  . MELANOMA EXCISION    . svd     x 1 - stillborn at 36 weeks    There were no vitals filed for this visit.      Subjective Assessment - 11/13/16 0826    Subjective Patient arrived to treatment with report of trying to walk around her  home and feels as if she overdid it. Asked if she was unable to drive with using one crutch.   Limitations Walking;House hold activities   How long can you walk comfortably? Short distances.   Currently in Pain? Yes   Pain Score 4    Pain Location Knee   Pain Orientation Right   Pain Descriptors / Indicators Tightness   Pain Type Surgical pain   Pain Onset In the past 7 days   Pain Frequency Constant            [facility] PT Assessment - 11/13/16 0001      Assessment   Medical Diagnosis Right knee scope/debridement.   Onset Date/Surgical Date 10/13/16   Next MD Visit 12/09/2016                     [facility] Adult PT Treatment/Exercise - 11/13/16 0001      Knee/Hip Exercises: Aerobic   Nustep L4 x15 min     Knee/Hip Exercises: Machines for Strengthening   Cybex Leg Press BLEs 1 pl, seat 8, 3x10 reps     Knee/Hip Exercises: Standing   Hip Flexion Stengthening;Both;2 sets;10 reps;Knee bent   Hip Abduction Stengthening;Both;2 sets;10 reps;Knee  straight   Hip Extension Stengthening;Both;2 sets;10 reps;Knee straight   Other Standing Knee Exercises Standing B HS curl x20 reps each     Knee/Hip Exercises: Supine   Straight Leg Raises Strengthening;Right;2 sets;10 reps     Modalities   Modalities Electrical Stimulation;Vasopneumatic     Acupuncturist Location R knee   Electrical Stimulation Action IFC   Electrical Stimulation Parameters 1-10 hz x15 min   Electrical Stimulation Goals Pain;Edema     Vasopneumatic   Number Minutes Vasopneumatic  15 minutes   Vasopnuematic Location  Knee   Vasopneumatic Pressure Medium   Vasopneumatic Temperature  34                PT Education - 11/12/16 1244    Education provided Yes   Education Details Gait training with and without crutch   Person(s) Educated Patient;Spouse   Methods Explanation;Demonstration   Comprehension Verbalized understanding;Returned demonstration              PT Long Term Goals - 11/12/16 1412      PT LONG TERM GOAL #1   Title Independent with a HEP.   Time 6   Period Weeks   Status Achieved     PT LONG TERM GOAL #2   Title Active right knee flexion to 120-125 degrees+ so the patient can perform functional tasks and do so with pain not > 2-3/10.   Time 6   Period Weeks   Status On-going     PT LONG TERM GOAL #3   Title Increase right knee strength to a solid 5/5 to provide good stability for accomplishment of functional activities   Period Weeks   Status On-going     PT LONG TERM GOAL #4   Title Perform a reciprocating stair gait with one railing with pain not > 2-3/10.   Status On-going               Plan - 11/13/16 0857    Clinical Impression Statement Patient continues to tolerate treatment well and has no complaints during treatment other than fatigue from strengthening exercises. Patient ambulated into treatment clinic with unilateral crutch and bandaid donned to inferiomedial R knee incision. BLE exercises completed in standing for RLE strengthening and weightbearing. Improved R Quad activation noted with SLR although patient fatigued quickly with SLR. Normal modalities response noted following removal of the modalities. Patient educated to contact her surgeon's office to inquire regarding her starting to drive.   Rehab Potential Excellent   PT Frequency 3x / week   PT Duration 4 weeks   PT Treatment/Interventions ADLs/Self Care Home Management;Cryotherapy;Electrical Stimulation;Gait training;Stair training;Functional mobility training;Therapeutic activities;Therapeutic exercise;Neuromuscular re-education;Patient/family education;Manual techniques;Vasopneumatic Device   PT Next Visit Plan Continue with R quad strengthening with pain management modalities per MPT POC, gait training with no device   Consulted and Agree with Plan of Care Patient      Patient will benefit from skilled therapeutic intervention  in order to improve the following deficits and impairments:  Abnormal gait, Pain, Decreased strength, Decreased mobility, Decreased range of motion, Difficulty walking  Visit Diagnosis: Chronic pain of right knee  Stiffness of right knee, not elsewhere classified  Localized edema     Problem List Patient Active Problem List   Diagnosis Date Noted  . PALPITATIONS 06/12/2010  . CHEST PAIN, PRECORDIAL 06/12/2010  . MIGRAINE HEADACHE 06/11/2010  . IBS 06/11/2010  . PULMONARY NODULE 09/17/2009  . ALLERGIC RHINITIS 09/11/2009    Merleen Nicely  Volney American, PTA 11/13/2016, 9:43 AM  [facility] [address] [place], [place], [address] Phone: [phone]   Fax:  [phone]  Name: GRACEMARIE SKEET MRN: [id number] Date of Birth: 11-Apr-1981

## 2016-11-17 ENCOUNTER — Encounter: Payer: Self-pay | Admitting: Physical Therapy

## 2016-11-17 ENCOUNTER — Ambulatory Visit: Payer: BLUE CROSS/BLUE SHIELD | Admitting: Physical Therapy

## 2016-11-17 DIAGNOSIS — M25561 Pain in right knee: Principal | ICD-10-CM

## 2016-11-17 DIAGNOSIS — M25661 Stiffness of right knee, not elsewhere classified: Secondary | ICD-10-CM

## 2016-11-17 DIAGNOSIS — G8929 Other chronic pain: Secondary | ICD-10-CM

## 2016-11-17 DIAGNOSIS — R600 Localized edema: Secondary | ICD-10-CM

## 2016-11-17 NOTE — Therapy (Signed)
[facility] [place], [place], [address] Phone: [phone]   Fax:  [phone]  Physical Therapy Treatment  Patient Details  Name: [name] MRN: [id number] Date of Birth: 1980-09-04 Referring Provider: Hart Robinsons MD  Encounter Date: 11/17/2016      PT End of Session - 11/17/16 1127    Visit Number 9   Number of Visits 12   Date for PT Re-Evaluation 12/01/16   PT Start Time 1120   PT Stop Time 1213   PT Time Calculation (min) 53 min   Activity Tolerance Patient tolerated treatment well   Behavior During Therapy [facility] for tasks assessed/performed      Past Medical History:  Diagnosis Date  . Abnormal Pap smear of cervix   . Acne fulminans   . Allergic rhinitis   . Brain cyst   . Breast nodule   . Cervical polyp   . Epilepsy ([place])   . Fibromyalgia   . GERD (gastroesophageal reflux disease)   . IBS (irritable bowel syndrome)   . Kidney stones   . Melanoma of hip ([place])   . Menorrhagia   . Migraine headache   . 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 Previous stillbirth or demise, antepartum   . Pulmonary nodule    previous evaluation - benign  . Seizures ([place])    recently diagnosed with epilepsy  . Sepsis(995.91)   . Shortness of breath    due to wt  . Small bowel obstruction ([place])   . Staphylococcal infection     Past Surgical History:  Procedure Laterality Date  . BREAST LUMPECTOMY  2008   right breast - benign; needle - localized excision of right breast mass x2  . caesarean section    . HYSTEROSCOPY W/D&C  04/27/2011   Procedure: DILATATION AND CURETTAGE (D&C) /HYSTEROSCOPY;  Surgeon: Thurnell Lose, MD;  Location: [facility];  Service: Gynecology;  Laterality: N/A;  . MELANOMA EXCISION    . svd     x 1 - stillborn at 36 weeks    There were no vitals filed for this visit.      Subjective Assessment - 11/17/16 1124    Subjective Reports completing scar massage with pain cream MD prescribed.  Reports that balance is off and thinks that may be throwing her off walking. Feels like she can walk good for about 45 minutes and then she feels like she drags her leg.   Limitations Walking;House hold activities   How long can you walk comfortably? Short distances.   Currently in Pain? Yes   Pain Score 3    Pain Location Knee   Pain Orientation Right   Pain Descriptors / Indicators Sore;Aching   Pain Type Surgical pain   Pain Onset In the past 7 days            [facility] PT Assessment - 11/17/16 0001      Assessment   Medical Diagnosis Right knee scope/debridement.   Onset Date/Surgical Date 10/13/16   Next MD Visit 12/09/2016                     [facility] Adult PT Treatment/Exercise - 11/17/16 0001      Knee/Hip Exercises: Aerobic   Nustep L5 x15 min     Knee/Hip Exercises: Machines for Strengthening   Cybex Leg Press BLEs 1 pl, seat 8, 3x10 reps     Knee/Hip Exercises: Standing   Hip Abduction Stengthening;Both;2 sets;10 reps;Knee straight  Rocker Board 2 minutes  ant/post with light UE support for balance   Other Standing Knee Exercises DLS on horizontal balance beam x3 min for balance on dynamic surface     Knee/Hip Exercises: Supine   Straight Leg Raises Strengthening;Right;2 sets;10 reps     Modalities   Modalities Electrical Stimulation;Vasopneumatic     Acupuncturist Location R knee   Electrical Stimulation Action IFC   Electrical Stimulation Parameters 1-10 hz x15 min   Electrical Stimulation Goals Pain;Edema     Vasopneumatic   Number Minutes Vasopneumatic  15 minutes   Vasopnuematic Location  Knee   Vasopneumatic Pressure Medium   Vasopneumatic Temperature  34                     PT Long Term Goals - 11/12/16 1412      PT LONG TERM GOAL #1   Title Independent with a HEP.   Time 6   Period Weeks   Status Achieved     PT LONG TERM GOAL #2   Title Active right knee flexion to 120-125  degrees+ so the patient can perform functional tasks and do so with pain not > 2-3/10.   Time 6   Period Weeks   Status On-going     PT LONG TERM GOAL #3   Title Increase right knee strength to a solid 5/5 to provide good stability for accomplishment of functional activities   Period Weeks   Status On-going     PT LONG TERM GOAL #4   Title Perform a reciprocating stair gait with one railing with pain not > 2-3/10.   Status On-going               Plan - 11/17/16 1208    Clinical Impression Statement Patient continues to be hesistant with weightbearing RLE. Increased time completed on Nustep to increase endurance for patient and assist with walking longer distances in preparation for returning to work. Only minimal discomfort reported by patient on leg press with light resistance. Balance activities on dynamic surfaces completed with patient hesitant to go without UE support and reported some discomfort in RLE. Weakness still present with R SLR today while fatigue present as well. Normal modalities response noted following removal of the modalities. Patient ambulated into clinic with one crutch but ambulated between exercises in clinic without crutch although antalgic deviation present. Patient educated to only go short distances without crutch to practice without it but to always have it when she knows she will be walking for prolonged period.   Rehab Potential Excellent   PT Frequency 3x / week   PT Duration 4 weeks   PT Treatment/Interventions ADLs/Self Care Home Management;Cryotherapy;Electrical Stimulation;Gait training;Stair training;Functional mobility training;Therapeutic activities;Therapeutic exercise;Neuromuscular re-education;Patient/family education;Manual techniques;Vasopneumatic Device   PT Next Visit Plan Continue with R quad strengthening with pain management modalities per MPT POC, gait training with no device   Consulted and Agree with Plan of Care Patient       Patient will benefit from skilled therapeutic intervention in order to improve the following deficits and impairments:  Abnormal gait, Pain, Decreased strength, Decreased mobility, Decreased range of motion, Difficulty walking  Visit Diagnosis: Chronic pain of right knee  Stiffness of right knee, not elsewhere classified  Localized edema     Problem List Patient Active Problem List   Diagnosis Date Noted  . PALPITATIONS 06/12/2010  . CHEST PAIN, PRECORDIAL 06/12/2010  . MIGRAINE HEADACHE 06/11/2010  . IBS  06/11/2010  . PULMONARY NODULE 09/17/2009  . ALLERGIC RHINITIS 09/11/2009    Wynelle Fanny, PTA 11/17/2016, 12:18 PM  [facility] [address] [place], [place], [address] Phone: [phone]   Fax:  [phone]  Name: [name] MRN: [id number] Date of Birth: 02/28/1981

## 2016-11-19 ENCOUNTER — Ambulatory Visit: Payer: BLUE CROSS/BLUE SHIELD | Admitting: Physical Therapy

## 2016-11-19 ENCOUNTER — Encounter: Payer: Self-pay | Admitting: Physical Therapy

## 2016-11-19 DIAGNOSIS — M25561 Pain in right knee: Secondary | ICD-10-CM | POA: Diagnosis not present

## 2016-11-19 DIAGNOSIS — G8929 Other chronic pain: Secondary | ICD-10-CM

## 2016-11-19 DIAGNOSIS — M25661 Stiffness of right knee, not elsewhere classified: Secondary | ICD-10-CM

## 2016-11-19 DIAGNOSIS — R600 Localized edema: Secondary | ICD-10-CM

## 2016-11-19 NOTE — Therapy (Signed)
[facility] [place], [place], [address] Phone: [phone]   Fax:  [phone]  Physical Therapy Treatment  Patient Details  Name: [name] MRN: [id number] Date of Birth: Aug 01, 1980 Referring Provider: Hart Robinsons MD  Encounter Date: 11/19/2016      PT End of Session - 11/19/16 1144    Visit Number 10   Number of Visits 12   Date for PT Re-Evaluation 12/01/16   PT Start Time 1120   PT Stop Time 1207   PT Time Calculation (min) 47 min   Activity Tolerance Patient tolerated treatment well   Behavior During Therapy [facility] for tasks assessed/performed      Past Medical History:  Diagnosis Date  . Abnormal Pap smear of cervix   . Acne fulminans   . Allergic rhinitis   . Brain cyst   . Breast nodule   . Cervical polyp   . Epilepsy ([place])   . Fibromyalgia   . GERD (gastroesophageal reflux disease)   . IBS (irritable bowel syndrome)   . Kidney stones   . Melanoma of hip ([place])   . Menorrhagia   . Migraine headache   . 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 Previous stillbirth or demise, antepartum   . Pulmonary nodule    previous evaluation - benign  . Seizures ([place])    recently diagnosed with epilepsy  . Sepsis(995.91)   . Shortness of breath    due to wt  . Small bowel obstruction ([place])   . Staphylococcal infection     Past Surgical History:  Procedure Laterality Date  . BREAST LUMPECTOMY  2008   right breast - benign; needle - localized excision of right breast mass x2  . caesarean section    . HYSTEROSCOPY W/D&C  04/27/2011   Procedure: DILATATION AND CURETTAGE (D&C) /HYSTEROSCOPY;  Surgeon: Thurnell Lose, MD;  Location: [facility];  Service: Gynecology;  Laterality: N/A;  . MELANOMA EXCISION    . svd     x 1 - stillborn at 36 weeks    There were no vitals filed for this visit.      Subjective Assessment - 11/19/16 1119    Subjective Reports that she has been making laps in her house without crutches.  Asked if she could get in    Limitations Walking;House hold activities   How long can you walk comfortably? Short distances.   Currently in Pain? Yes   Pain Score 2    Pain Location Knee   Pain Orientation Right   Pain Descriptors / Indicators Aching   Pain Type Surgical pain   Pain Onset In the past 7 days            [facility] PT Assessment - 11/19/16 0001      Assessment   Medical Diagnosis Right knee scope/debridement.   Onset Date/Surgical Date 10/13/16   Next MD Visit 12/09/2016                     [facility] Adult PT Treatment/Exercise - 11/19/16 0001      Knee/Hip Exercises: Aerobic   Nustep L6 x10 min     Knee/Hip Exercises: Machines for Strengthening   Cybex Knee Extension 10# x5 reps stopped secondary to pain   Cybex Leg Press BLEs 1.5 pl, seat 8, 3x10 reps     Knee/Hip Exercises: Standing   Forward Step Up Right;2 sets;10 reps;Hand Hold: 2;Step Height: 6"     Knee/Hip Exercises: Seated  Long [employer] Strengthening;Right;2 sets;10 reps;Weights   [place] [employer] 4 lbs.     Knee/Hip Exercises: Supine   Straight Leg Raises Strengthening;Right;1 set;10 reps     Knee/Hip Exercises: Sidelying   Hip ABduction Strengthening;Right;1 set;10 reps     Modalities   Modalities Electrical Stimulation;Vasopneumatic     Acupuncturist Location R knee   Electrical Stimulation Action IFC   Electrical Stimulation Parameters 1-10 hz x15 min   Electrical Stimulation Goals Pain;Edema     Vasopneumatic   Number Minutes Vasopneumatic  15 minutes   Vasopnuematic Location  Knee   Vasopneumatic Pressure Medium   Vasopneumatic Temperature  34                     PT Long Term Goals - 11/12/16 1412      PT LONG TERM GOAL #1   Title Independent with a HEP.   Time 6   Period Weeks   Status Achieved     PT LONG TERM GOAL #2   Title Active right knee flexion to 120-125 degrees+ so the patient can perform functional  tasks and do so with pain not > 2-3/10.   Time 6   Period Weeks   Status On-going     PT LONG TERM GOAL #3   Title Increase right knee strength to a solid 5/5 to provide good stability for accomplishment of functional activities   Period Weeks   Status On-going     PT LONG TERM GOAL #4   Title Perform a reciprocating stair gait with one railing with pain not > 2-3/10.   Status On-going               Plan - 11/19/16 1155    Clinical Impression Statement Patient tolerated today's treatment well as she demonstrated much improved gait with only minimal antalgic devation without crutch. Mali Applegate, MPT assessed R knee incisions today upon patient asking about getting in pool. All incisions have fully healed and Mali Applegate, MPT approved for patient to get in pool. Greater resistance completed on leg press today without complaint from patient. Machine knee extension strengthening attempted with only 10# but stopped secondary to R knee pain beginning. LAQ with 4# ankleweight tolerated better by patient with rest break between set. Forward step up completed today with 6" step with anterior knee pain discomfort noted by patient. Distal Quad burn experienced by patient with SLR today. Normal modalities response noted following removal of the modalities.   Rehab Potential Excellent   PT Frequency 3x / week   PT Duration 4 weeks   PT Treatment/Interventions ADLs/Self Care Home Management;Cryotherapy;Electrical Stimulation;Gait training;Stair training;Functional mobility training;Therapeutic activities;Therapeutic exercise;Neuromuscular re-education;Patient/family education;Manual techniques;Vasopneumatic Device   PT Next Visit Plan Continue with R quad strengthening with pain management modalities per MPT POC, gait training with no device   Consulted and Agree with Plan of Care Patient      Patient will benefit from skilled therapeutic intervention in order to improve the following  deficits and impairments:  Abnormal gait, Pain, Decreased strength, Decreased mobility, Decreased range of motion, Difficulty walking  Visit Diagnosis: Chronic pain of right knee  Stiffness of right knee, not elsewhere classified  Localized edema     Problem List Patient Active Problem List   Diagnosis Date Noted  . PALPITATIONS 06/12/2010  . CHEST PAIN, PRECORDIAL 06/12/2010  . MIGRAINE HEADACHE 06/11/2010  . IBS 06/11/2010  . PULMONARY NODULE 09/17/2009  . ALLERGIC  RHINITIS 09/11/2009    Wynelle Fanny, PTA 11/19/2016, 12:09 PM  [facility] [address] [place], [place], [address] Phone: [phone]   Fax:  [phone]  Name: [name] MRN: [id number] Date of Birth: Aug 13, 1980

## 2016-11-24 ENCOUNTER — Ambulatory Visit: Payer: BLUE CROSS/BLUE SHIELD | Admitting: Physical Therapy

## 2016-11-24 DIAGNOSIS — M25661 Stiffness of right knee, not elsewhere classified: Secondary | ICD-10-CM

## 2016-11-24 DIAGNOSIS — G8929 Other chronic pain: Secondary | ICD-10-CM

## 2016-11-24 DIAGNOSIS — M25561 Pain in right knee: Principal | ICD-10-CM

## 2016-11-24 DIAGNOSIS — R600 Localized edema: Secondary | ICD-10-CM

## 2016-11-24 NOTE — Therapy (Signed)
[facility] [place], [place], [address] Phone: [phone]   Fax:  [phone]  Physical Therapy Treatment  Patient Details  Name: [name] MRN: [id number] Date of Birth: 02-02-1981 Referring Provider: Hart Robinsons MD  Encounter Date: 11/24/2016      PT End of Session - 11/24/16 0903    Visit Number 11   Number of Visits 12   Date for PT Re-Evaluation 12/01/16   PT Start Time 0900   PT Stop Time 0958   PT Time Calculation (min) 58 min   Activity Tolerance Patient tolerated treatment well   Behavior During Therapy [facility] for tasks assessed/performed      Past Medical History:  Diagnosis Date  . Abnormal Pap smear of cervix   . Acne fulminans   . Allergic rhinitis   . Brain cyst   . Breast nodule   . Cervical polyp   . Epilepsy ([place])   . Fibromyalgia   . GERD (gastroesophageal reflux disease)   . IBS (irritable bowel syndrome)   . Kidney stones   . Melanoma of hip ([place])   . Menorrhagia   . Migraine headache   . 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 Previous stillbirth or demise, antepartum   . Pulmonary nodule    previous evaluation - benign  . Seizures ([place])    recently diagnosed with epilepsy  . Sepsis(995.91)   . Shortness of breath    due to wt  . Small bowel obstruction ([place])   . Staphylococcal infection     Past Surgical History:  Procedure Laterality Date  . BREAST LUMPECTOMY  2008   right breast - benign; needle - localized excision of right breast mass x2  . caesarean section    . HYSTEROSCOPY W/D&C  04/27/2011   Procedure: DILATATION AND CURETTAGE (D&C) /HYSTEROSCOPY;  Surgeon: Thurnell Lose, MD;  Location: [facility];  Service: Gynecology;  Laterality: N/A;  . MELANOMA EXCISION    . svd     x 1 - stillborn at 36 weeks    There were no vitals filed for this visit.      Subjective Assessment - 11/24/16 0903    Subjective Patient states she has been having increased pain and some swelling  in her medial right knee in the past few days. She stopped using her topical antiinflammatory in case that was causing it.   Pertinent History Sulfa allergy   Limitations Walking;House hold activities   How long can you walk comfortably? Short distances.   Currently in Pain? Yes   Pain Score 6                          [facility] Adult PT Treatment/Exercise - 11/24/16 0001      Knee/Hip Exercises: Stretches   Sports administrator Right;2 reps;60 seconds  with strap in prone   Other Knee/Hip Stretches Hip adduction stretch in hooklying (butterfly) 2x30 second     Knee/Hip Exercises: Aerobic   Nustep L6 x10 min     Knee/Hip Exercises: Supine   Straight Leg Raises Strengthening;Right     Modalities   Modalities Passenger transport manager Location R knee   Electrical Stimulation Action premod   Electrical Stimulation Parameters 1-10 Hz x 15 min   Electrical Stimulation Goals Edema;Pain     Vasopneumatic   Number Minutes Vasopneumatic  15 minutes   Vasopnuematic Location  Knee  Vasopneumatic Pressure Medium   Vasopneumatic Temperature  34     Manual Therapy   Manual Therapy Soft tissue mobilization   Soft tissue mobilization to R hip adductors in supine                PT Education - 11/24/16 1226    Education provided Yes   Education Details HEP   Person(s) Educated Patient   Methods Explanation;Demonstration;Handout;Tactile cues;Verbal cues   Comprehension Verbalized understanding;Returned demonstration             PT Long Term Goals - 11/12/16 1412      PT LONG TERM GOAL #1   Title Independent with a HEP.   Time 6   Period Weeks   Status Achieved     PT LONG TERM GOAL #2   Title Active right knee flexion to 120-125 degrees+ so the patient can perform functional tasks and do so with pain not > 2-3/10.   Time 6   Period Weeks   Status On-going     PT LONG TERM GOAL #3   Title  Increase right knee strength to a solid 5/5 to provide good stability for accomplishment of functional activities   Period Weeks   Status On-going     PT LONG TERM GOAL #4   Title Perform a reciprocating stair gait with one railing with pain not > 2-3/10.   Status On-going               Plan - 11/24/16 0945    Clinical Impression Statement Patient presents today with c/o of increased medial R knee pain. She stands with increased WB on the left and R hip adduction. Additionally, she wears flip flops daily with little arch support and which promote pronation putting stress on medial knee. PT advised wearing shoes with good arch support and to stand with equal WB. She has marked tightness and tone in R hip adductors and did well with STW. She still has weakness with SLR and was encouaged to peform daily, not just at PT. Patient stated she had a sulfa allergy and PT recommended she contact her pharmacist to see if the topical antiinflammatory may be having adverse effect.   Rehab Potential Excellent   PT Treatment/Interventions ADLs/Self Care Home Management;Cryotherapy;Electrical Stimulation;Gait training;Stair training;Functional mobility training;Therapeutic activities;Therapeutic exercise;Neuromuscular re-education;Patient/family education;Manual techniques;Vasopneumatic Device   PT Next Visit Plan Recert needed; Continue with R quad strengthening with pain management modalities per MPT POC, gait training   PT Home Exercise Plan SLR, prone quad stretch, butterfly stretch      Patient will benefit from skilled therapeutic intervention in order to improve the following deficits and impairments:  Abnormal gait, Pain, Decreased strength, Decreased mobility, Decreased range of motion, Difficulty walking  Visit Diagnosis: Chronic pain of right knee  Localized edema  Stiffness of right knee, not elsewhere classified     Problem List Patient Active Problem List   Diagnosis Date Noted   . PALPITATIONS 06/12/2010  . CHEST PAIN, PRECORDIAL 06/12/2010  . MIGRAINE HEADACHE 06/11/2010  . IBS 06/11/2010  . PULMONARY NODULE 09/17/2009  . ALLERGIC RHINITIS 09/11/2009    [name] PT 11/24/2016, 12:33 PM  [facility] [address] [place], [place], [address] Phone: [phone]   Fax:  [phone]  Name: TWILIA YAKLIN MRN: [id number] Date of Birth: 1980-12-22

## 2016-11-24 NOTE — Patient Instructions (Signed)
    [name], PT 11/24/16 9:48 AM [facility] [address] [place], [place], [address] Phone: [phone]   Fax:  [phone]

## 2016-11-26 ENCOUNTER — Ambulatory Visit: Payer: BLUE CROSS/BLUE SHIELD | Admitting: Physical Therapy

## 2016-11-26 ENCOUNTER — Encounter: Payer: Self-pay | Admitting: Physical Therapy

## 2016-11-26 DIAGNOSIS — M25561 Pain in right knee: Secondary | ICD-10-CM | POA: Diagnosis not present

## 2016-11-26 DIAGNOSIS — G8929 Other chronic pain: Secondary | ICD-10-CM

## 2016-11-26 DIAGNOSIS — M25661 Stiffness of right knee, not elsewhere classified: Secondary | ICD-10-CM

## 2016-11-26 DIAGNOSIS — R600 Localized edema: Secondary | ICD-10-CM

## 2016-11-26 NOTE — Therapy (Signed)
[facility] [place], [place], [address] Phone: [phone]   Fax:  [phone]  Physical Therapy Treatment  Patient Details  Name: [name] MRN: [id number] Date of Birth: November 15, 1980 Referring Provider: Hart Robinsons MD  Encounter Date: 11/26/2016      PT End of Session - 11/26/16 1114    Visit Number 12   Number of Visits 12   Date for PT Re-Evaluation 12/01/16   PT Start Time 1118   PT Stop Time 1209   PT Time Calculation (min) 51 min   Activity Tolerance Patient tolerated treatment well   Behavior During Therapy [facility] for tasks assessed/performed      Past Medical History:  Diagnosis Date  . Abnormal Pap smear of cervix   . Acne fulminans   . Allergic rhinitis   . Brain cyst   . Breast nodule   . Cervical polyp   . Epilepsy ([place])   . Fibromyalgia   . GERD (gastroesophageal reflux disease)   . IBS (irritable bowel syndrome)   . Kidney stones   . Melanoma of hip ([place])   . Menorrhagia   . Migraine headache   . 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 Previous stillbirth or demise, antepartum   . Pulmonary nodule    previous evaluation - benign  . Seizures ([place])    recently diagnosed with epilepsy  . Sepsis(995.91)   . Shortness of breath    due to wt  . Small bowel obstruction ([place])   . Staphylococcal infection     Past Surgical History:  Procedure Laterality Date  . BREAST LUMPECTOMY  2008   right breast - benign; needle - localized excision of right breast mass x2  . caesarean section    . HYSTEROSCOPY W/D&C  04/27/2011   Procedure: DILATATION AND CURETTAGE (D&C) /HYSTEROSCOPY;  Surgeon: Thurnell Lose, MD;  Location: [facility];  Service: Gynecology;  Laterality: N/A;  . MELANOMA EXCISION    . svd     x 1 - stillborn at 36 weeks    There were no vitals filed for this visit.      Subjective Assessment - 11/26/16 1114    Subjective Reports that she wore tennis shoes today per Julie's  recommendation. Reports she went walking yesterday with an incline.   Pertinent History Sulfa allergy   Limitations Walking;House hold activities   How long can you walk comfortably? Short distances.   Currently in Pain? Yes   Pain Score 4    Pain Location Knee   Pain Orientation Right   Pain Descriptors / Indicators Discomfort   Pain Type Surgical pain   Pain Onset In the past 7 days            [facility] PT Assessment - 11/26/16 0001      Assessment   Medical Diagnosis Right knee scope/debridement.   Onset Date/Surgical Date 10/13/16   Next MD Visit 12/09/2016                     [facility] Adult PT Treatment/Exercise - 11/26/16 0001      Knee/Hip Exercises: Aerobic   Stationary Bike L2 x10 min     Knee/Hip Exercises: Standing   Terminal Knee Extension Limitations Green theraband RLE x20 reps 3 sec hold   Lateral Step Up Right;2 sets;10 reps;Hand Hold: 2;Step Height: 6"   Forward Step Up Right;2 sets;10 reps;Hand Hold: 2;Step Height: 6"     Knee/Hip Exercises: Supine  Straight Leg Raises Strengthening;Right;2 sets;10 reps     Knee/Hip Exercises: Sidelying   Hip ABduction Strengthening;Right;1 set;10 reps     Modalities   Modalities Electrical Stimulation;Vasopneumatic     Acupuncturist Location R knee   Electrical Stimulation Action Pre-Mod   Electrical Stimulation Parameters 80-150 hz x15 min   Electrical Stimulation Goals Edema;Pain     Vasopneumatic   Number Minutes Vasopneumatic  15 minutes   Vasopnuematic Location  Knee   Vasopneumatic Pressure Medium   Vasopneumatic Temperature  34                     PT Long Term Goals - 11/12/16 1412      PT LONG TERM GOAL #1   Title Independent with a HEP.   Time 6   Period Weeks   Status Achieved     PT LONG TERM GOAL #2   Title Active right knee flexion to 120-125 degrees+ so the patient can perform functional tasks and do so with pain not > 2-3/10.    Time 6   Period Weeks   Status On-going     PT LONG TERM GOAL #3   Title Increase right knee strength to a solid 5/5 to provide good stability for accomplishment of functional activities   Period Weeks   Status On-going     PT LONG TERM GOAL #4   Title Perform a reciprocating stair gait with one railing with pain not > 2-3/10.   Status On-going               Plan - 11/26/16 1201    Clinical Impression Statement Patient tolerated today's treatment well as she was able to tolerate stationary bike with resistance without complaint. Patient guided through new exercises with caution such as lateral step ups. Fatigue still set in with SLR and hip abduction today. Tightness still palpated and tenderness experienced by patient with STW to R hip adductors. Patient ambulates with more IR and toe in which may cause tightening of hip adductors. Normal modalities response noted following removal of the modalities.   Rehab Potential Excellent   PT Frequency 3x / week   PT Duration 4 weeks   PT Treatment/Interventions ADLs/Self Care Home Management;Cryotherapy;Electrical Stimulation;Gait training;Stair training;Functional mobility training;Therapeutic activities;Therapeutic exercise;Neuromuscular re-education;Patient/family education;Manual techniques;Vasopneumatic Device   PT Next Visit Plan Recert needed; Continue with R quad strengthening with pain management modalities per MPT POC, gait training   PT Home Exercise Plan SLR, prone quad stretch, butterfly stretch   Consulted and Agree with Plan of Care Patient      Patient will benefit from skilled therapeutic intervention in order to improve the following deficits and impairments:  Abnormal gait, Pain, Decreased strength, Decreased mobility, Decreased range of motion, Difficulty walking  Visit Diagnosis: Chronic pain of right knee  Localized edema  Stiffness of right knee, not elsewhere classified     Problem List Patient Active  Problem List   Diagnosis Date Noted  . PALPITATIONS 06/12/2010  . CHEST PAIN, PRECORDIAL 06/12/2010  . MIGRAINE HEADACHE 06/11/2010  . IBS 06/11/2010  . PULMONARY NODULE 09/17/2009  . ALLERGIC RHINITIS 09/11/2009    Ahmed Prima, PTA 11/26/16 12:13 PM  [facility] [place], [place], [address] Phone: [phone]   Fax:  [phone]  Name: LIAT MAYOL MRN: [id number] Date of Birth: 04-24-1981

## 2016-11-26 NOTE — Addendum Note (Signed)
Addended by: Edelyn Heidel, [place] W on: 11/26/2016 02:36 PM   Modules accepted: Orders

## 2016-12-01 ENCOUNTER — Ambulatory Visit: Payer: BLUE CROSS/BLUE SHIELD | Admitting: Physical Therapy

## 2016-12-01 DIAGNOSIS — M25561 Pain in right knee: Principal | ICD-10-CM

## 2016-12-01 DIAGNOSIS — M25661 Stiffness of right knee, not elsewhere classified: Secondary | ICD-10-CM

## 2016-12-01 DIAGNOSIS — G8929 Other chronic pain: Secondary | ICD-10-CM

## 2016-12-01 DIAGNOSIS — R600 Localized edema: Secondary | ICD-10-CM

## 2016-12-01 NOTE — Therapy (Signed)
[facility] [place], [place], [address] Phone: [phone]   Fax:  [phone]  Physical Therapy Treatment  Patient Details  Name: [name] MRN: [id number] Date of Birth: 1980/11/20 Referring Provider: Hart Robinsons MD  Encounter Date: 12/01/2016      PT End of Session - 12/01/16 1120    Visit Number 13   Number of Visits 20   Date for PT Re-Evaluation 12/29/16   PT Start Time 1117   PT Stop Time 1215   PT Time Calculation (min) 58 min   Activity Tolerance Patient tolerated treatment well   Behavior During Therapy [facility] for tasks assessed/performed      Past Medical History:  Diagnosis Date  . Abnormal Pap smear of cervix   . Acne fulminans   . Allergic rhinitis   . Brain cyst   . Breast nodule   . Cervical polyp   . Epilepsy ([place])   . Fibromyalgia   . GERD (gastroesophageal reflux disease)   . IBS (irritable bowel syndrome)   . Kidney stones   . Melanoma of hip ([place])   . Menorrhagia   . Migraine headache   . 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 Previous stillbirth or demise, antepartum   . Pulmonary nodule    previous evaluation - benign  . Seizures ([place])    recently diagnosed with epilepsy  . Sepsis(995.91)   . Shortness of breath    due to wt  . Small bowel obstruction ([place])   . Staphylococcal infection     Past Surgical History:  Procedure Laterality Date  . BREAST LUMPECTOMY  2008   right breast - benign; needle - localized excision of right breast mass x2  . caesarean section    . HYSTEROSCOPY W/D&C  04/27/2011   Procedure: DILATATION AND CURETTAGE (D&C) /HYSTEROSCOPY;  Surgeon: Thurnell Lose, MD;  Location: [facility];  Service: Gynecology;  Laterality: N/A;  . MELANOMA EXCISION    . svd     x 1 - stillborn at 36 weeks    There were no vitals filed for this visit.      Subjective Assessment - 12/01/16 1121    Subjective Patient presents today with decreased pain to 2/10. She experienced  R quad cramp in the pool when standing on tip toes. Then also with squatting. Feels like charlie horse.   Pertinent History Sulfa allergy   Limitations Walking;House hold activities   How long can you walk comfortably? Short distances.   Currently in Pain? Yes   Pain Score 2    Pain Location Knee   Pain Orientation Right   Pain Descriptors / Indicators Discomfort   Pain Type Surgical pain            [facility] PT Assessment - 12/01/16 0001      AROM   AROM Assessment Site Knee   Right/Left Knee Right   Right Knee Extension 0   Right Knee Flexion 120     Strength   Overall Strength Comments R knee flex 5/5; ext 4+/5 with some pain                     [facility] Adult PT Treatment/Exercise - 12/01/16 0001      Knee/Hip Exercises: Aerobic   Stationary Bike L4 x 10     Knee/Hip Exercises: Machines for Strengthening   Cybex Leg Press BLEs 1 plate x 10. 1.5 pl, seat 5, 2x10 reps  Knee/Hip Exercises: Standing   Lateral Step Up Right;2 sets;10 reps;Hand Hold: 2;Step Height: 6"   Forward Step Up Right;20 reps;Hand Hold: 2;Step Height: 8"  with active quad and glut squeeze   Forward Step Up Limitations Stepup to 12 inch step x 5; 2 hand hold   Wall Squat 5 reps;10 seconds  with ball squeeze; then 5 with no ball 5 sec hold     Modalities   Modalities Electrical Stimulation;Vasopneumatic     Electrical Stimulation   Electrical Stimulation Location R knee   Electrical Stimulation Action  premod   Electrical Stimulation Parameters 80-150 Hz x 15 min   Electrical Stimulation Goals Pain     Vasopneumatic   Number Minutes Vasopneumatic  15 minutes   Vasopnuematic Location  Knee   Vasopneumatic Pressure Medium   Vasopneumatic Temperature  34                     PT Long Term Goals - 12/01/16 1204      PT LONG TERM GOAL #1   Title Independent with a HEP.   Time 6   Period Weeks   Status Achieved     PT LONG TERM GOAL #2   Title Active right knee  flexion to 120-125 degrees+ so the patient can perform functional tasks and do so with pain not > 2-3/10.   Baseline 120 deg 12/01/16 but pain varies from 2-6/10   Time 6   Period Weeks   Status Partially Met     PT LONG TERM GOAL #3   Title Increase right knee strength to a solid 5/5 to provide good stability for accomplishment of functional activities   Baseline functional weakness with squats and step ups; MMT flex 5/5, ext 4+/5   Time 6   Period Weeks   Status On-going     PT LONG TERM GOAL #4   Title Perform a reciprocating stair gait with one railing with pain not > 2-3/10.   Time 6   Period Weeks   Status On-going               Plan - 12/01/16 1203    Clinical Impression Statement Patient did well with TE today. She is somewhat fearful to attempt new exercises, but does well once she starts. She has met her ROM goals, but still has pain at end range and significant functional weakness particularly with stairs and step ups which she will need for RTW.   Clinical Presentation Stable   Rehab Potential Excellent   PT Frequency 2x / week   PT Duration 4 weeks   PT Treatment/Interventions ADLs/Self Care Home Management;Cryotherapy;Electrical Stimulation;Gait training;Stair training;Functional mobility training;Therapeutic activities;Therapeutic exercise;Neuromuscular re-education;Patient/family education;Manual techniques;Vasopneumatic Device   PT Next Visit Plan Work on functional stengthening including stairs, eccentric quad; Continue with R quad strengthening with pain management modalities per MPT POC, gait training   PT Home Exercise Plan SLR, prone quad stretch, butterfly stretch   Consulted and Agree with Plan of Care Patient      Patient will benefit from skilled therapeutic intervention in order to improve the following deficits and impairments:  Abnormal gait, Pain, Decreased strength, Decreased mobility, Decreased range of motion, Difficulty walking  Visit  Diagnosis: Chronic pain of right knee  Localized edema  Stiffness of right knee, not elsewhere classified     Problem List Patient Active Problem List   Diagnosis Date Noted  . PALPITATIONS 06/12/2010  . CHEST PAIN, PRECORDIAL 06/12/2010  . MIGRAINE  HEADACHE 06/11/2010  . IBS 06/11/2010  . PULMONARY NODULE 09/17/2009  . ALLERGIC RHINITIS 09/11/2009    [name] PT 12/01/2016, 12:27 PM  [facility] [address] [place], [place], [address] Phone: [phone]   Fax:  [phone]  Name: PARNIKA TWETEN MRN: [id number] Date of Birth: 24-Sep-1980

## 2016-12-03 ENCOUNTER — Ambulatory Visit: Payer: BLUE CROSS/BLUE SHIELD | Admitting: Physical Therapy

## 2016-12-03 ENCOUNTER — Encounter: Payer: Self-pay | Admitting: Physical Therapy

## 2016-12-03 DIAGNOSIS — M25561 Pain in right knee: Secondary | ICD-10-CM | POA: Diagnosis not present

## 2016-12-03 DIAGNOSIS — M25661 Stiffness of right knee, not elsewhere classified: Secondary | ICD-10-CM

## 2016-12-03 DIAGNOSIS — G8929 Other chronic pain: Secondary | ICD-10-CM

## 2016-12-03 DIAGNOSIS — R600 Localized edema: Secondary | ICD-10-CM

## 2016-12-03 NOTE — Therapy (Signed)
[facility] [place], [place], [address] Phone: [phone]   Fax:  [phone]  Physical Therapy Treatment  Patient Details  Name: [name] MRN: [id number] Date of Birth: [birth year] Referring Provider: Hart Robinsons MD  Encounter Date: 12/03/2016      PT End of Session - 12/03/16 1144    Visit Number 14   Number of Visits 20   Date for PT Re-Evaluation 12/29/16   PT Start Time 1115   PT Stop Time 1203   PT Time Calculation (min) 48 min   Activity Tolerance Patient tolerated treatment well   Behavior During Therapy [facility] for tasks assessed/performed      Past Medical History:  Diagnosis Date  . Abnormal Pap smear of cervix   . Acne fulminans   . Allergic rhinitis   . Brain cyst   . Breast nodule   . Cervical polyp   . Epilepsy ([place])   . Fibromyalgia   . GERD (gastroesophageal reflux disease)   . IBS (irritable bowel syndrome)   . Kidney stones   . Melanoma of hip ([place])   . Menorrhagia   . Migraine headache   . 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 Previous stillbirth or demise, antepartum   . Pulmonary nodule    previous evaluation - benign  . Seizures ([place])    recently diagnosed with epilepsy  . Sepsis(995.91)   . Shortness of breath    due to wt  . Small bowel obstruction ([place])   . Staphylococcal infection     Past Surgical History:  Procedure Laterality Date  . BREAST LUMPECTOMY  2008   right breast - benign; needle - localized excision of right breast mass x2  . caesarean section    . HYSTEROSCOPY W/D&C  04/27/2011   Procedure: DILATATION AND CURETTAGE (D&C) /HYSTEROSCOPY;  Surgeon: Thurnell Lose, MD;  Location: [facility];  Service: Gynecology;  Laterality: N/A;  . MELANOMA EXCISION    . svd     x 1 - stillborn at 36 weeks    There were no vitals filed for this visit.      Subjective Assessment - 12/03/16 1120    Subjective Patient reported doing good after last treatment and minimal pain  overall   Pertinent History Sulfa allergy   Limitations Walking;House hold activities   How long can you walk comfortably? Short distances.   Currently in Pain? Yes   Pain Score 2    Pain Location Knee   Pain Orientation Right   Pain Descriptors / Indicators Discomfort   Pain Type Surgical pain   Pain Onset More than a month ago   Pain Frequency Constant   Aggravating Factors  prolong walking/standing   Pain Relieving Factors rest and ice                         [facility] Adult PT Treatment/Exercise - 12/03/16 0001      Knee/Hip Exercises: Aerobic   Stationary Bike L4 x 72mn   Elliptical L1 R3 x531m     Knee/Hip Exercises: Machines for Strengthening   Cybex Knee Extension 10# half range x10bil LE, 2x10 eccentric contol   Cybex Leg Press seat 5 / 1 1/2 plts 2x15     Knee/Hip Exercises: Standing   Forward Step Up Right;20 reps;Hand Hold: 2;Step Height: 8"  activate glut and quad squeeze     Electrical Stimulation   Electrical Stimulation Location right knee  Electrical Stimulation Action premod   Electrical Stimulation Parameters 80-150hz  x65mn   Electrical Stimulation Goals Pain     Vasopneumatic   Number Minutes Vasopneumatic  15 minutes   Vasopnuematic Location  Knee   Vasopneumatic Pressure Medium                PT Education - 12/03/16 1158    Education provided Yes   Education Details HEP   Person(s) Educated Patient   Methods Explanation;Demonstration;Handout   Comprehension Verbalized understanding;Returned demonstration             PT Long Term Goals - 12/01/16 1204      PT LONG TERM GOAL #1   Title Independent with a HEP.   Time 6   Period Weeks   Status Achieved     PT LONG TERM GOAL #2   Title Active right knee flexion to 120-125 degrees+ so the patient can perform functional tasks and do so with pain not > 2-3/10.   Baseline 120 deg 12/01/16 but pain varies from 2-6/10   Time 6   Period Weeks   Status Partially Met      PT LONG TERM GOAL #3   Title Increase right knee strength to a solid 5/5 to provide good stability for accomplishment of functional activities   Baseline functional weakness with squats and step ups; MMT flex 5/5, ext 4+/5   Time 6   Period Weeks   Status On-going     PT LONG TERM GOAL #4   Title Perform a reciprocating stair gait with one railing with pain not > 2-3/10.   Time 6   Period Weeks   Status On-going               Plan - 12/03/16 1159    Clinical Impression Statement Patient tolerated tratment well today. Patient progressing with strengthening exercises with no pain. HEP given today to progress HEP. Educated on pain free exercises on all activities. Patient progressing toward goals yet ongoing due to pain and strength deficts.    Rehab Potential Excellent   PT Frequency 2x / week   PT Duration 4 weeks   PT Treatment/Interventions ADLs/Self Care Home Management;Cryotherapy;Electrical Stimulation;Gait training;Stair training;Functional mobility training;Therapeutic activities;Therapeutic exercise;Neuromuscular re-education;Patient/family education;Manual techniques;Vasopneumatic Device   PT Next Visit Plan Work on functional stengthening including stairs, eccentric quad; Continue with R quad strengthening with pain management modalities per MPT POC, gait training   Consulted and Agree with Plan of Care Patient      Patient will benefit from skilled therapeutic intervention in order to improve the following deficits and impairments:  Abnormal gait, Pain, Decreased strength, Decreased mobility, Decreased range of motion, Difficulty walking  Visit Diagnosis: Chronic pain of right knee  Localized edema  Stiffness of right knee, not elsewhere classified     Problem List Patient Active Problem List   Diagnosis Date Noted  . PALPITATIONS 06/12/2010  . CHEST PAIN, PRECORDIAL 06/12/2010  . MIGRAINE HEADACHE 06/11/2010  . IBS 06/11/2010  . PULMONARY NODULE  09/17/2009  . ALLERGIC RHINITIS 09/11/2009    DPhillips Climes PTA 12/03/2016, 12:04 PM  [facility] [address] [address]Phone: [phone]  Fax:  [phone] Name: [name] BORDENMRN: [id number] of Birth: [birth year]

## 2016-12-03 NOTE — Patient Instructions (Addendum)
Strengthening: Straight Leg Raise (Phase 2)    Resting on forearms, tighten muscles on front of left thigh, then lift leg ___5_ inches from surface, keeping knee locked and slightly turn foot outward Repeat ____ times per set. Do ____ sets per session. Do ____ sessions per day.  Bridging  Slowly raise buttocks from floor, keeping stomach tight. Pull ball between knees for knee squeeze with exercise Repeat _10___ times per set. Do __2__ sets per session. Do __2__ sessions per day.             Terminal Knee Extension (Standing)    Facing anchor with right knee slightly bent and tubing just above knee, gently pull knee back straight. Do not overextend knee. Tighten glut with exercise Repeat __30__ times per set. Do _1-2___ sets per session. Do _1-2___ sessions per day.

## 2016-12-08 ENCOUNTER — Ambulatory Visit: Payer: BLUE CROSS/BLUE SHIELD | Admitting: Physical Therapy

## 2016-12-08 DIAGNOSIS — R600 Localized edema: Secondary | ICD-10-CM

## 2016-12-08 DIAGNOSIS — M25561 Pain in right knee: Principal | ICD-10-CM

## 2016-12-08 DIAGNOSIS — M25661 Stiffness of right knee, not elsewhere classified: Secondary | ICD-10-CM

## 2016-12-08 DIAGNOSIS — G8929 Other chronic pain: Secondary | ICD-10-CM

## 2016-12-08 NOTE — Therapy (Signed)
[facility] [place], [place], [address] Phone: [phone]   Fax:  [phone]  Physical Therapy Treatment  Patient Details  Name: [name] MRN: [id number] Date of Birth: Nov 17, 1980 Referring Provider: Hart Robinsons MD  Encounter Date: 12/08/2016      PT End of Session - 12/08/16 1131    Visit Number 15   Number of Visits 20   Date for PT Re-Evaluation 12/29/16   PT Start Time 1115   PT Stop Time 1208   PT Time Calculation (min) 53 min   Activity Tolerance Patient tolerated treatment well   Behavior During Therapy [facility] for tasks assessed/performed      Past Medical History:  Diagnosis Date  . Abnormal Pap smear of cervix   . Acne fulminans   . Allergic rhinitis   . Brain cyst   . Breast nodule   . Cervical polyp   . Epilepsy ([place])   . Fibromyalgia   . GERD (gastroesophageal reflux disease)   . IBS (irritable bowel syndrome)   . Kidney stones   . Melanoma of hip ([place])   . Menorrhagia   . Migraine headache   . 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 Previous stillbirth or demise, antepartum   . Pulmonary nodule    previous evaluation - benign  . Seizures ([place])    recently diagnosed with epilepsy  . Sepsis(995.91)   . Shortness of breath    due to wt  . Small bowel obstruction ([place])   . Staphylococcal infection     Past Surgical History:  Procedure Laterality Date  . BREAST LUMPECTOMY  2008   right breast - benign; needle - localized excision of right breast mass x2  . caesarean section    . HYSTEROSCOPY W/D&C  04/27/2011   Procedure: DILATATION AND CURETTAGE (D&C) /HYSTEROSCOPY;  Surgeon: Thurnell Lose, MD;  Location: [facility];  Service: Gynecology;  Laterality: N/A;  . MELANOMA EXCISION    . svd     x 1 - stillborn at 36 weeks    There were no vitals filed for this visit.      Subjective Assessment - 12/08/16 1141    Subjective I'm doing good today with a low pain-level.  I don't have the  endurance like I did before.  I am doing stairs better.   Patient Stated Goals Get back to work.   Pain Score 2    Pain Location Knee   Pain Orientation Right   Pain Descriptors / Indicators Discomfort   Pain Type Surgical pain   Pain Onset More than a month ago                         [facility] Adult PT Treatment/Exercise - 12/08/16 0001      Exercises   Exercises Knee/Hip     Knee/Hip Exercises: Aerobic   Stationary Bike Level4 x 15 minutes.   Elliptical L3 R3 x 10 minutes.     Knee/Hip Exercises: Machines for Strengthening   Cybex Knee Extension 10# bilateral knee extension x 5 minutes for muscular endurance.     Modalities   Modalities Location manager Stimulation Location Right knee.   Electrical Stimulation Action Pre-mod.   Electrical Stimulation Parameters Constant 80-150 Hz x 20 minutes.   Electrical Stimulation Goals Pain     Vasopneumatic   Number Minutes Vasopneumatic  20 minutes   Vasopnuematic Location  --  Right knee.   Vasopneumatic Pressure Medium                     PT Long Term Goals - 12/08/16 1202      PT LONG TERM GOAL #3   Title Increase right knee strength to a solid 5/5 to provide good stability for accomplishment of functional activities   Baseline functional weakness with squats and step ups; MMT flex 5/5, ext 4+/5   Time 6   Period Weeks     PT LONG TERM GOAL #4   Title Perform a reciprocating stair gait with one railing with pain not > 2-3/10.   Time 6   Period Weeks   Status On-going               Plan - 12/08/16 1152    Clinical Impression Statement The patient has made excellent overall progress toward goals.  She wants to get back to work but states she lacks the endurance she once had.  Patient has 5 visits remaining and would definitely like to finish them out.  Patient states she is doing stairs better.      Patient will benefit  from skilled therapeutic intervention in order to improve the following deficits and impairments:  Abnormal gait, Pain, Decreased strength, Decreased mobility, Decreased range of motion, Difficulty walking  Visit Diagnosis: Chronic pain of right knee  Localized edema  Stiffness of right knee, not elsewhere classified     Problem List Patient Active Problem List   Diagnosis Date Noted  . PALPITATIONS 06/12/2010  . CHEST PAIN, PRECORDIAL 06/12/2010  . MIGRAINE HEADACHE 06/11/2010  . IBS 06/11/2010  . PULMONARY NODULE 09/17/2009  . ALLERGIC RHINITIS 09/11/2009    Syanne Looney, [place] MPT 12/08/2016, 12:11 PM  [facility] [address] [place], [place], [address] Phone: [phone]   Fax:  [phone]  Name: [name] MRN: [id number] Date of Birth: 03-30-1981

## 2016-12-10 ENCOUNTER — Encounter: Payer: BLUE CROSS/BLUE SHIELD | Admitting: Physical Therapy

## 2016-12-15 ENCOUNTER — Ambulatory Visit: Payer: BLUE CROSS/BLUE SHIELD | Attending: Specialist | Admitting: Physical Therapy

## 2016-12-15 ENCOUNTER — Encounter: Payer: Self-pay | Admitting: Physical Therapy

## 2016-12-15 DIAGNOSIS — G8929 Other chronic pain: Secondary | ICD-10-CM | POA: Diagnosis present

## 2016-12-15 DIAGNOSIS — R600 Localized edema: Secondary | ICD-10-CM | POA: Diagnosis present

## 2016-12-15 DIAGNOSIS — M25661 Stiffness of right knee, not elsewhere classified: Secondary | ICD-10-CM | POA: Diagnosis present

## 2016-12-15 DIAGNOSIS — M25561 Pain in right knee: Secondary | ICD-10-CM | POA: Diagnosis not present

## 2016-12-15 NOTE — Therapy (Signed)
[facility] [place], [place], [address] Phone: [phone]   Fax:  [phone]  Physical Therapy Treatment  Patient Details  Name: [name] MRN: [id number] Date of Birth: 03-25-1981 Referring Provider: Hart Robinsons MD  Encounter Date: 12/15/2016      PT End of Session - 12/15/16 0815    Visit Number 16   Number of Visits 20   Date for PT Re-Evaluation 12/29/16   PT Start Time 0819   PT Stop Time 0903   PT Time Calculation (min) 44 min   Activity Tolerance Patient tolerated treatment well   Behavior During Therapy [facility] for tasks assessed/performed      Past Medical History:  Diagnosis Date  . Abnormal Pap smear of cervix   . Acne fulminans   . Allergic rhinitis   . Brain cyst   . Breast nodule   . Cervical polyp   . Epilepsy ([place])   . Fibromyalgia   . GERD (gastroesophageal reflux disease)   . IBS (irritable bowel syndrome)   . Kidney stones   . Melanoma of hip ([place])   . Menorrhagia   . Migraine headache   . 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 Previous stillbirth or demise, antepartum   . Pulmonary nodule    previous evaluation - benign  . Seizures ([place])    recently diagnosed with epilepsy  . Sepsis(995.91)   . Shortness of breath    due to wt  . Small bowel obstruction ([place])   . Staphylococcal infection     Past Surgical History:  Procedure Laterality Date  . BREAST LUMPECTOMY  2008   right breast - benign; needle - localized excision of right breast mass x2  . caesarean section    . HYSTEROSCOPY W/D&C  04/27/2011   Procedure: DILATATION AND CURETTAGE (D&C) /HYSTEROSCOPY;  Surgeon: Thurnell Lose, MD;  Location: [facility];  Service: Gynecology;  Laterality: N/A;  . MELANOMA EXCISION    . svd     x 1 - stillborn at 36 weeks    There were no vitals filed for this visit.      Subjective Assessment - 12/15/16 0815    Subjective Reports returning to work yesterday and works 4 hours a day this  week. Reports she expected some soreness.   Pertinent History Sulfa allergy   Limitations Walking;House hold activities   How long can you walk comfortably? Short distances.   Patient Stated Goals Get back to work.   Currently in Pain? Yes   Pain Score 5    Pain Location Knee   Pain Orientation Right   Pain Descriptors / Indicators Sore;Tightness;Discomfort   Pain Type Surgical pain   Pain Onset More than a month ago            [facility] PT Assessment - 12/15/16 0001      Assessment   Medical Diagnosis Right knee scope/debridement.   Onset Date/Surgical Date 10/13/16   Next MD Visit 12/2016                     [facility] Adult PT Treatment/Exercise - 12/15/16 0001      Knee/Hip Exercises: Aerobic   Stationary Bike L3 x10 min     Knee/Hip Exercises: Machines for Strengthening   Cybex Leg Press 2 pl, seat 6, 3x10 reps     Knee/Hip Exercises: Standing   Terminal Knee Extension Limitations Green theraband RLE x20 reps 3 sec hold  Knee/Hip Exercises: Supine   Straight Leg Raises Strengthening;Right;2 sets;10 reps     Knee/Hip Exercises: Sidelying   Hip ABduction Strengthening;Right;2 sets;10 reps     Modalities   Modalities Electrical Stimulation;Vasopneumatic     Electrical Stimulation   Electrical Stimulation Location R knee   Electrical Stimulation Action Pre-Mod   Electrical Stimulation Parameters 80-150 hz x20 min   Electrical Stimulation Goals Pain;Tone     Vasopneumatic   Number Minutes Vasopneumatic  20 minutes   Vasopnuematic Location  Knee   Vasopneumatic Pressure Medium   Vasopneumatic Temperature  68                     PT Long Term Goals - 12/08/16 1202      PT LONG TERM GOAL #3   Title Increase right knee strength to a solid 5/5 to provide good stability for accomplishment of functional activities   Baseline functional weakness with squats and step ups; MMT flex 5/5, ext 4+/5   Time 6   Period Weeks     PT LONG TERM GOAL  #4   Title Perform a reciprocating stair gait with one railing with pain not > 2-3/10.   Time 6   Period Weeks   Status On-going               Plan - 12/15/16 0909    Clinical Impression Statement Patient tolerated today's treatment fairly well as she arrived with increaesed discomfort and fatigue following return to work yesterday. Patient requested a light treatment secondary to the fatigue. Fatigue reported quickly by patient with SLR and hip abduction in SL today. Normal modalities response noted following removal of the modalities.   Rehab Potential Excellent   PT Frequency 2x / week   PT Duration 4 weeks   PT Treatment/Interventions ADLs/Self Care Home Management;Cryotherapy;Electrical Stimulation;Gait training;Stair training;Functional mobility training;Therapeutic activities;Therapeutic exercise;Neuromuscular re-education;Patient/family education;Manual techniques;Vasopneumatic Device   PT Next Visit Plan Work on functional stengthening including stairs, eccentric quad; Continue with R quad strengthening with pain management modalities per MPT POC, gait training   PT Home Exercise Plan SLR, prone quad stretch, butterfly stretch   Consulted and Agree with Plan of Care Patient      Patient will benefit from skilled therapeutic intervention in order to improve the following deficits and impairments:  Abnormal gait, Pain, Decreased strength, Decreased mobility, Decreased range of motion, Difficulty walking  Visit Diagnosis: Chronic pain of right knee  Localized edema  Stiffness of right knee, not elsewhere classified     Problem List Patient Active Problem List   Diagnosis Date Noted  . PALPITATIONS 06/12/2010  . CHEST PAIN, PRECORDIAL 06/12/2010  . MIGRAINE HEADACHE 06/11/2010  . IBS 06/11/2010  . PULMONARY NODULE 09/17/2009  . ALLERGIC RHINITIS 09/11/2009    Wynelle Fanny, PTA 12/15/2016, 9:17 AM  [facility] [place] [facility] [address] [place], [place], [address] Phone: [phone]   Fax:  [phone]  Name: [name] MRN: [id number] Date of Birth: 1980-11-15

## 2016-12-17 ENCOUNTER — Ambulatory Visit: Payer: BLUE CROSS/BLUE SHIELD | Admitting: Physical Therapy

## 2016-12-17 DIAGNOSIS — R600 Localized edema: Secondary | ICD-10-CM

## 2016-12-17 DIAGNOSIS — G8929 Other chronic pain: Secondary | ICD-10-CM

## 2016-12-17 DIAGNOSIS — M25561 Pain in right knee: Principal | ICD-10-CM

## 2016-12-17 DIAGNOSIS — M25661 Stiffness of right knee, not elsewhere classified: Secondary | ICD-10-CM

## 2016-12-17 NOTE — Therapy (Signed)
[facility] [place], [place], [address] Phone: [phone]   Fax:  [phone]  Physical Therapy Treatment  Patient Details  Name: [name] MRN: [id number] Date of Birth: [birth year] Referring Provider: Hart Robinsons MD  Encounter Date: 12/17/2016      PT End of Session - 12/17/16 1524    PT Start Time 0230   PT Stop Time 0317   PT Time Calculation (min) 47 min   Activity Tolerance Patient tolerated treatment well   Behavior During Therapy [facility] for tasks assessed/performed      Past Medical History:  Diagnosis Date  . Abnormal Pap smear of cervix   . Acne fulminans   . Allergic rhinitis   . Brain cyst   . Breast nodule   . Cervical polyp   . Epilepsy ([place])   . Fibromyalgia   . GERD (gastroesophageal reflux disease)   . IBS (irritable bowel syndrome)   . Kidney stones   . Melanoma of hip ([place])   . Menorrhagia   . Migraine headache   . 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 Previous stillbirth or demise, antepartum   . Pulmonary nodule    previous evaluation - benign  . Seizures ([place])    recently diagnosed with epilepsy  . Sepsis(995.91)   . Shortness of breath    due to wt  . Small bowel obstruction ([place])   . Staphylococcal infection     Past Surgical History:  Procedure Laterality Date  . BREAST LUMPECTOMY  2008   right breast - benign; needle - localized excision of right breast mass x2  . caesarean section    . HYSTEROSCOPY W/D&C  04/27/2011   Procedure: DILATATION AND CURETTAGE (D&C) /HYSTEROSCOPY;  Surgeon: Thurnell Lose, MD;  Location: [facility];  Service: Gynecology;  Laterality: N/A;  . MELANOMA EXCISION    . svd     x 1 - stillborn at 36 weeks    There were no vitals filed for this visit.      Subjective Assessment - 12/17/16 1438    Subjective This is my first week back to work.  I walk almost all time time on concrete.  My pain is a solid 5/10 today.  I'm going to call the doctor and tell  him I'm not ready to go 8 hours.   Pain Score 5    Pain Location Knee   Pain Orientation Right   Pain Descriptors / Indicators Aching   Pain Type Surgical pain   Pain Onset More than a month ago                         [facility] Adult PT Treatment/Exercise - 12/17/16 0001      Exercises   Exercises Knee/Hip     Knee/Hip Exercises: Aerobic   Stationary Bike Level 3 x 15 minutes.     Modalities   Modalities Location manager Stimulation Location Right knee.   Electrical Stimulation Action IFC   Electrical Stimulation Parameters Constant at 80-150 Hz x 20 minutes.   Electrical Stimulation Goals Pain     Vasopneumatic   Number Minutes Vasopneumatic  20 minutes   Vasopnuematic Location  --  Right knee.   Vasopneumatic Pressure Medium                     PT Long Term Goals - 12/08/16 1202  PT LONG TERM GOAL #3   Title Increase right knee strength to a solid 5/5 to provide good stability for accomplishment of functional activities   Baseline functional weakness with squats and step ups; MMT flex 5/5, ext 4+/5   Time 6   Period Weeks     PT LONG TERM GOAL #4   Title Perform a reciprocating stair gait with one railing with pain not > 2-3/10.   Time 6   Period Weeks   Status On-going               Plan - 12/17/16 1524    Clinical Impression Statement Patient just got off work prior to coming to therapy.  She has been up walking on concrete floors for 4 hours.  proceeded with a conservative treatment today and avoid resistance training today.      Patient will benefit from skilled therapeutic intervention in order to improve the following deficits and impairments:  Abnormal gait, Pain, Decreased strength, Decreased mobility, Decreased range of motion, Difficulty walking  Visit Diagnosis: Chronic pain of right knee  Localized edema  Stiffness of right knee, not elsewhere  classified     Problem List Patient Active Problem List   Diagnosis Date Noted  . PALPITATIONS 06/12/2010  . CHEST PAIN, PRECORDIAL 06/12/2010  . MIGRAINE HEADACHE 06/11/2010  . IBS 06/11/2010  . PULMONARY NODULE 09/17/2009  . ALLERGIC RHINITIS 09/11/2009    Manha Amato, [place] MPT 12/17/2016, 3:31 PM  [facility] [address] [place], [place], [address] Phone: [phone]   Fax:  [phone]  Name: [name] MRN: [id number] Date of Birth: [birth year]

## 2016-12-21 ENCOUNTER — Ambulatory Visit: Payer: BLUE CROSS/BLUE SHIELD | Admitting: *Deleted

## 2016-12-21 DIAGNOSIS — M25561 Pain in right knee: Principal | ICD-10-CM

## 2016-12-21 DIAGNOSIS — R600 Localized edema: Secondary | ICD-10-CM

## 2016-12-21 DIAGNOSIS — M25661 Stiffness of right knee, not elsewhere classified: Secondary | ICD-10-CM

## 2016-12-21 DIAGNOSIS — G8929 Other chronic pain: Secondary | ICD-10-CM

## 2016-12-21 NOTE — Therapy (Signed)
[facility] [place], [place], [address] Phone: [phone]   Fax:  [phone]  Physical Therapy Treatment  Patient Details  Name: [name] MRN: [id number] Date of Birth: August 23, 1980 Referring Provider: Hart Robinsons MD  Encounter Date: 12/21/2016      PT End of Session - 12/21/16 1644    Visit Number 17   Number of Visits 20   Date for PT Re-Evaluation 12/29/16   PT Start Time 1600   PT Stop Time 1652   PT Time Calculation (min) 52 min      Past Medical History:  Diagnosis Date  . Abnormal Pap smear of cervix   . Acne fulminans   . Allergic rhinitis   . Brain cyst   . Breast nodule   . Cervical polyp   . Epilepsy ([place])   . Fibromyalgia   . GERD (gastroesophageal reflux disease)   . IBS (irritable bowel syndrome)   . Kidney stones   . Melanoma of hip ([place])   . Menorrhagia   . Migraine headache   . 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 Previous stillbirth or demise, antepartum   . Pulmonary nodule    previous evaluation - benign  . Seizures ([place])    recently diagnosed with epilepsy  . Sepsis(995.91)   . Shortness of breath    due to wt  . Small bowel obstruction ([place])   . Staphylococcal infection     Past Surgical History:  Procedure Laterality Date  . BREAST LUMPECTOMY  2008   right breast - benign; needle - localized excision of right breast mass x2  . caesarean section    . HYSTEROSCOPY W/D&C  04/27/2011   Procedure: DILATATION AND CURETTAGE (D&C) /HYSTEROSCOPY;  Surgeon: Thurnell Lose, MD;  Location: [facility];  Service: Gynecology;  Laterality: N/A;  . MELANOMA EXCISION    . svd     x 1 - stillborn at 36 weeks    There were no vitals filed for this visit.                       [facility] Adult PT Treatment/Exercise - 12/21/16 0001      Exercises   Exercises Knee/Hip     Knee/Hip Exercises: Aerobic   Stationary Bike Level 3 x 10 minutes.   Elliptical L5 R5 x 5 minutes.     Knee/Hip Exercises: Machines for Strengthening   Cybex Knee Extension 10# bilateral knee extension 3x10   Cybex Leg Press 2 pl, seat 6, 4 x10 reps     Modalities   Modalities Electrical Stimulation;Vasopneumatic     Electrical Stimulation   Electrical Stimulation Location Right knee. IFC 1-10hz  x 15 mins   Electrical Stimulation Goals Pain     Vasopneumatic   Number Minutes Vasopneumatic  15 minutes   Vasopnuematic Location  --  Right knee.   Vasopneumatic Pressure Medium   Vasopneumatic Temperature  36                     PT Long Term Goals - 12/08/16 1202      PT LONG TERM GOAL #3   Title Increase right knee strength to a solid 5/5 to provide good stability for accomplishment of functional activities   Baseline functional weakness with squats and step ups; MMT flex 5/5, ext 4+/5   Time 6   Period Weeks     PT LONG TERM GOAL #4   Title Perform  a reciprocating stair gait with one railing with pain not > 2-3/10.   Time 6   Period Weeks   Status On-going               Plan - 12/21/16 1646    Clinical Impression Statement Pt arrived to clinic today tired from being up on her feet at work and knee a little sore. She was able to perform resistance exs today and was mainly fatigued end of Rx. Normal response to modalities today.   Rehab Potential Excellent   PT Frequency 2x / week   PT Duration 4 weeks   PT Treatment/Interventions ADLs/Self Care Home Management;Cryotherapy;Electrical Stimulation;Gait training;Stair training;Functional mobility training;Therapeutic activities;Therapeutic exercise;Neuromuscular re-education;Patient/family education;Manual techniques;Vasopneumatic Device   PT Next Visit Plan Work on functional stengthening including stairs, eccentric quad; Continue with R quad strengthening with pain management modalities per MPT POC, gait training   PT Home Exercise Plan SLR, prone quad stretch, butterfly stretch   Consulted and Agree with Plan  of Care Patient      Patient will benefit from skilled therapeutic intervention in order to improve the following deficits and impairments:  Abnormal gait, Pain, Decreased strength, Decreased mobility, Decreased range of motion, Difficulty walking  Visit Diagnosis: Chronic pain of right knee  Localized edema  Stiffness of right knee, not elsewhere classified     Problem List Patient Active Problem List   Diagnosis Date Noted  . PALPITATIONS 06/12/2010  . CHEST PAIN, PRECORDIAL 06/12/2010  . MIGRAINE HEADACHE 06/11/2010  . IBS 06/11/2010  . PULMONARY NODULE 09/17/2009  . ALLERGIC RHINITIS 09/11/2009    [name] 12/21/2016, 4:55 PM  [facility] [place], [place], [address] Phone: [phone]   Fax:  [phone]  Name: [name] MRN: [id number] Date of Birth: September 03, 1980

## 2016-12-24 ENCOUNTER — Ambulatory Visit: Payer: BLUE CROSS/BLUE SHIELD | Admitting: Physical Therapy

## 2016-12-24 ENCOUNTER — Encounter: Payer: Self-pay | Admitting: Physical Therapy

## 2016-12-24 DIAGNOSIS — M25661 Stiffness of right knee, not elsewhere classified: Secondary | ICD-10-CM

## 2016-12-24 DIAGNOSIS — M25561 Pain in right knee: Principal | ICD-10-CM

## 2016-12-24 DIAGNOSIS — R600 Localized edema: Secondary | ICD-10-CM

## 2016-12-24 DIAGNOSIS — G8929 Other chronic pain: Secondary | ICD-10-CM

## 2016-12-24 NOTE — Therapy (Signed)
[facility] [place], [place], [address] Phone: [phone]   Fax:  [phone]  Physical Therapy Treatment  Patient Details  Name: [name] MRN: [id number] Date of Birth: 03/08/1981 Referring Provider: Hart Robinsons MD  Encounter Date: 12/24/2016      PT End of Session - 12/24/16 1600    Visit Number 18   Number of Visits 20   Date for PT Re-Evaluation 12/29/16   PT Start Time 1600   PT Stop Time 1649   PT Time Calculation (min) 49 min   Activity Tolerance Patient tolerated treatment well   Behavior During Therapy [facility] for tasks assessed/performed      Past Medical History:  Diagnosis Date  . Abnormal Pap smear of cervix   . Acne fulminans   . Allergic rhinitis   . Brain cyst   . Breast nodule   . Cervical polyp   . Epilepsy ([place])   . Fibromyalgia   . GERD (gastroesophageal reflux disease)   . IBS (irritable bowel syndrome)   . Kidney stones   . Melanoma of hip ([place])   . Menorrhagia   . Migraine headache   . 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 Previous stillbirth or demise, antepartum   . Pulmonary nodule    previous evaluation - benign  . Seizures ([place])    recently diagnosed with epilepsy  . Sepsis(995.91)   . Shortness of breath    due to wt  . Small bowel obstruction ([place])   . Staphylococcal infection     Past Surgical History:  Procedure Laterality Date  . BREAST LUMPECTOMY  2008   right breast - benign; needle - localized excision of right breast mass x2  . caesarean section    . HYSTEROSCOPY W/D&C  04/27/2011   Procedure: DILATATION AND CURETTAGE (D&C) /HYSTEROSCOPY;  Surgeon: Thurnell Lose, MD;  Location: [facility];  Service: Gynecology;  Laterality: N/A;  . MELANOMA EXCISION    . svd     x 1 - stillborn at 36 weeks    There were no vitals filed for this visit.      Subjective Assessment - 12/24/16 1600    Subjective Reports that she has been standing all day today at the [facility].  Reports that with all the standing today her knee did "alright."   Pertinent History Sulfa allergy   Limitations Walking;House hold activities   How long can you walk comfortably? Short distances.   Patient Stated Goals Get back to work.   Currently in Pain? Yes   Pain Score 1    Pain Location Knee   Pain Orientation Right   Pain Descriptors / Indicators Dull;Other (Comment)  "aggravating"   Pain Type Surgical pain   Pain Onset More than a month ago            [facility] PT Assessment - 12/24/16 0001      Assessment   Medical Diagnosis Right knee scope/debridement.   Onset Date/Surgical Date 10/13/16   Next MD Visit 01/08/2017                     [facility] Adult PT Treatment/Exercise - 12/24/16 0001      Knee/Hip Exercises: Aerobic   Stationary Bike Level 3 x 10 minutes.   Elliptical L5 R5 x 5 minutes.     Knee/Hip Exercises: Machines for Strengthening   Cybex Knee Extension 10# 3x10 reps   Cybex Knee Flexion 30# 3x10 reps  Cybex Leg Press 2 pl, seat 6, 3 x10 reps     Knee/Hip Exercises: Standing   Terminal Knee Extension Limitations Pink XTS RLE x20 reps 5 sec hold   Functional Squat 1 set;10 reps  mini squat     Modalities   Modalities Electrical Stimulation;Vasopneumatic     Electrical Stimulation   Electrical Stimulation Location R knee   Electrical Stimulation Action IFC   Electrical Stimulation Parameters 1-10 hz x15 min   Electrical Stimulation Goals Pain     Vasopneumatic   Number Minutes Vasopneumatic  15 minutes   Vasopnuematic Location  Knee   Vasopneumatic Pressure Medium   Vasopneumatic Temperature  66                     PT Long Term Goals - 12/08/16 1202      PT LONG TERM GOAL #3   Title Increase right knee strength to a solid 5/5 to provide good stability for accomplishment of functional activities   Baseline functional weakness with squats and step ups; MMT flex 5/5, ext 4+/5   Time 6   Period Weeks     PT LONG TERM  GOAL #4   Title Perform a reciprocating stair gait with one railing with pain not > 2-3/10.   Time 6   Period Weeks   Status On-going               Plan - 12/24/16 1641    Clinical Impression Statement Patient tolerated today's treatment well although fatigued from running errands and standing for very prolonged period of time. Patient taken through machine strengthening as well as more weightearing exercises such as mini squats with fatigued reported. Normal modalities response noted following removal of the modalities.   Rehab Potential Excellent   PT Frequency 2x / week   PT Duration 4 weeks   PT Treatment/Interventions ADLs/Self Care Home Management;Cryotherapy;Electrical Stimulation;Gait training;Stair training;Functional mobility training;Therapeutic activities;Therapeutic exercise;Neuromuscular re-education;Patient/family education;Manual techniques;Vasopneumatic Device   PT Next Visit Plan Work on functional stengthening including stairs, eccentric quad; Continue with R quad strengthening with pain management modalities per MPT POC, gait training   PT Home Exercise Plan SLR, prone quad stretch, butterfly stretch   Consulted and Agree with Plan of Care Patient      Patient will benefit from skilled therapeutic intervention in order to improve the following deficits and impairments:  Abnormal gait, Pain, Decreased strength, Decreased mobility, Decreased range of motion, Difficulty walking  Visit Diagnosis: Chronic pain of right knee  Localized edema  Stiffness of right knee, not elsewhere classified     Problem List Patient Active Problem List   Diagnosis Date Noted  . PALPITATIONS 06/12/2010  . CHEST PAIN, PRECORDIAL 06/12/2010  . MIGRAINE HEADACHE 06/11/2010  . IBS 06/11/2010  . PULMONARY NODULE 09/17/2009  . ALLERGIC RHINITIS 09/11/2009    Wynelle Fanny, PTA 12/24/2016, 4:55 PM  [facility] [address] [place], [place], [address] Phone: [phone]   Fax:  [phone]  Name: [name] MRN: [id number] Date of Birth: Mar 01, 1981

## 2016-12-29 ENCOUNTER — Ambulatory Visit: Payer: BLUE CROSS/BLUE SHIELD | Admitting: *Deleted

## 2016-12-29 DIAGNOSIS — M25561 Pain in right knee: Principal | ICD-10-CM

## 2016-12-29 DIAGNOSIS — R600 Localized edema: Secondary | ICD-10-CM

## 2016-12-29 DIAGNOSIS — M25661 Stiffness of right knee, not elsewhere classified: Secondary | ICD-10-CM

## 2016-12-29 DIAGNOSIS — G8929 Other chronic pain: Secondary | ICD-10-CM

## 2016-12-29 NOTE — Therapy (Signed)
[facility] [place], [place], [address] Phone: [phone]   Fax:  [phone]  Physical Therapy Treatment  Patient Details  Name: BARRETT HOLTHAUS MRN: [id number] Date of Birth: September 10, 1980 Referring Provider: Hart Robinsons MD  Encounter Date: 12/29/2016      PT End of Session - 12/29/16 1626    Visit Number 19   Number of Visits 20   Date for PT Re-Evaluation 12/29/16   PT Start Time 1600   PT Stop Time 8185   PT Time Calculation (min) 53 min      Past Medical History:  Diagnosis Date  . Abnormal Pap smear of cervix   . Acne fulminans   . Allergic rhinitis   . Brain cyst   . Breast nodule   . Cervical polyp   . Epilepsy ([place])   . Fibromyalgia   . GERD (gastroesophageal reflux disease)   . IBS (irritable bowel syndrome)   . Kidney stones   . Melanoma of hip ([place])   . Menorrhagia   . Migraine headache   . 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 Previous stillbirth or demise, antepartum   . Pulmonary nodule    previous evaluation - benign  . Seizures ([place])    recently diagnosed with epilepsy  . Sepsis(995.91)   . Shortness of breath    due to wt  . Small bowel obstruction ([place])   . Staphylococcal infection     Past Surgical History:  Procedure Laterality Date  . BREAST LUMPECTOMY  2008   right breast - benign; needle - localized excision of right breast mass x2  . caesarean section    . HYSTEROSCOPY W/D&C  04/27/2011   Procedure: DILATATION AND CURETTAGE (D&C) /HYSTEROSCOPY;  Surgeon: Thurnell Lose, MD;  Location: [facility];  Service: Gynecology;  Laterality: N/A;  . MELANOMA EXCISION    . svd     x 1 - stillborn at 36 weeks    There were no vitals filed for this visit.      Subjective Assessment - 12/29/16 1625    Subjective RT knee is doing fairly good today, but my LT knee feels like it needs to pop and has sharp pains at times   Pertinent History Sulfa allergy   Limitations Walking;House hold  activities   How long can you walk comfortably? Short distances.   Patient Stated Goals Get back to work.   Currently in Pain? Yes   Pain Score 2    Pain Location Knee   Pain Orientation Right   Pain Descriptors / Indicators Dull   Pain Type Surgical pain   Pain Onset More than a month ago                         [facility] Adult PT Treatment/Exercise - 12/29/16 0001      Exercises   Exercises Knee/Hip     Knee/Hip Exercises: Aerobic   Stationary Bike Level 3 x 10 minutes.   Elliptical L5 R5 x 10 minutes.     Knee/Hip Exercises: Machines for Strengthening   Cybex Knee Extension 10# 3x10 -15 reps   Cybex Knee Flexion 30# 3x10-15 reps   Cybex Leg Press 2 pl, seat 6, 3 x10 reps     Modalities   Modalities Electrical Stimulation;Vasopneumatic     Electrical Stimulation   Electrical Stimulation Location Right knee. IFC 1-10hz  x 15 mins   Electrical Stimulation Goals Pain  Vasopneumatic   Number Minutes Vasopneumatic  15 minutes   Vasopnuematic Location  Knee   Vasopneumatic Pressure Medium   Vasopneumatic Temperature  36                     PT Long Term Goals - 12/08/16 1202      PT LONG TERM GOAL #3   Title Increase right knee strength to a solid 5/5 to provide good stability for accomplishment of functional activities   Baseline functional weakness with squats and step ups; MMT flex 5/5, ext 4+/5   Time 6   Period Weeks     PT LONG TERM GOAL #4   Title Perform a reciprocating stair gait with one railing with pain not > 2-3/10.   Time 6   Period Weeks   Status On-going               Plan - 12/29/16 1741    Clinical Impression Statement Pt arrived today doing fairly well with RT knee, but has had some pain in LT knee at times. She was able to complete all therex today with minimal complaints of pain and mainly fatigue.   Rehab Potential Excellent   PT Frequency 2x / week   PT Duration 4 weeks   PT Treatment/Interventions  ADLs/Self Care Home Management;Cryotherapy;Electrical Stimulation;Gait training;Stair training;Functional mobility training;Therapeutic activities;Therapeutic exercise;Neuromuscular re-education;Patient/family education;Manual techniques;Vasopneumatic Device   PT Next Visit Plan MD note and DC to Gym program   PT Home Exercise Plan SLR, prone quad stretch, butterfly stretch   Consulted and Agree with Plan of Care Patient      Patient will benefit from skilled therapeutic intervention in order to improve the following deficits and impairments:  Abnormal gait, Pain, Decreased strength, Decreased mobility, Decreased range of motion, Difficulty walking  Visit Diagnosis: Chronic pain of right knee  Localized edema  Stiffness of right knee, not elsewhere classified     Problem List Patient Active Problem List   Diagnosis Date Noted  . PALPITATIONS 06/12/2010  . CHEST PAIN, PRECORDIAL 06/12/2010  . MIGRAINE HEADACHE 06/11/2010  . IBS 06/11/2010  . PULMONARY NODULE 09/17/2009  . ALLERGIC RHINITIS 09/11/2009    Lemon Sternberg,CHRIS, PTA 12/29/2016, 6:00 PM  [facility] [place], [place], [address] Phone: [phone]   Fax:  [phone]  Name: MARKEESHA CHAR MRN: [id number] Date of Birth: [birth year]

## 2016-12-31 ENCOUNTER — Ambulatory Visit: Payer: BLUE CROSS/BLUE SHIELD | Admitting: *Deleted

## 2017-01-05 ENCOUNTER — Encounter: Payer: BLUE CROSS/BLUE SHIELD | Admitting: Physical Therapy

## 2017-01-07 ENCOUNTER — Ambulatory Visit: Payer: BLUE CROSS/BLUE SHIELD | Admitting: Physical Therapy

## 2017-01-07 ENCOUNTER — Encounter: Payer: Self-pay | Admitting: Physical Therapy

## 2017-01-07 DIAGNOSIS — M25661 Stiffness of right knee, not elsewhere classified: Secondary | ICD-10-CM

## 2017-01-07 DIAGNOSIS — G8929 Other chronic pain: Secondary | ICD-10-CM

## 2017-01-07 DIAGNOSIS — R600 Localized edema: Secondary | ICD-10-CM

## 2017-01-07 DIAGNOSIS — M25561 Pain in right knee: Principal | ICD-10-CM

## 2017-01-07 NOTE — Therapy (Addendum)
[facility] [place], [place], [address] Phone: [phone]   Fax:  [phone]  Physical Therapy Treatment  Patient Details  Name: [name] MRN: [id number] Date of Birth: 09/20/1980 Referring Provider: Hart Robinsons MD  Encounter Date: 01/07/2017      PT End of Session - 01/07/17 1655    Visit Number 20   Number of Visits 20   Date for PT Re-Evaluation 12/29/16   PT Start Time 0370   PT Stop Time 1740   PT Time Calculation (min) 52 min   Activity Tolerance Patient tolerated treatment well   Behavior During Therapy [facility] for tasks assessed/performed      Past Medical History:  Diagnosis Date  . Abnormal Pap smear of cervix   . Acne fulminans   . Allergic rhinitis   . Brain cyst   . Breast nodule   . Cervical polyp   . Epilepsy ([place])   . Fibromyalgia   . GERD (gastroesophageal reflux disease)   . IBS (irritable bowel syndrome)   . Kidney stones   . Melanoma of hip ([place])   . Menorrhagia   . Migraine headache   . 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 Previous stillbirth or demise, antepartum   . Pulmonary nodule    previous evaluation - benign  . Seizures ([place])    recently diagnosed with epilepsy  . Sepsis(995.91)   . Shortness of breath    due to wt  . Small bowel obstruction ([place])   . Staphylococcal infection     Past Surgical History:  Procedure Laterality Date  . BREAST LUMPECTOMY  2008   right breast - benign; needle - localized excision of right breast mass x2  . caesarean section    . HYSTEROSCOPY W/D&C  04/27/2011   Procedure: DILATATION AND CURETTAGE (D&C) /HYSTEROSCOPY;  Surgeon: Thurnell Lose, MD;  Location: [facility];  Service: Gynecology;  Laterality: N/A;  . MELANOMA EXCISION    . svd     x 1 - stillborn at 36 weeks    There were no vitals filed for this visit.      Subjective Assessment - 01/07/17 1650    Subjective Reports that she has been having some locking sensation in R knee.  Reports that she has been having a very audible pop sound when she extends R knee which is painful. Reports that small popping began last week during the middle of the week but not to extent as before surgery.   Pertinent History Sulfa allergy   Limitations Walking;House hold activities   How long can you walk comfortably? Short distances.   Patient Stated Goals Get back to work.   Currently in Pain? Yes   Pain Score 3    Pain Location Knee   Pain Orientation Right   Pain Descriptors / Indicators Discomfort   Pain Type Surgical pain   Pain Onset More than a month ago   Pain Frequency Constant            [facility] PT Assessment - 01/07/17 0001      Assessment   Medical Diagnosis Right knee scope/debridement.   Onset Date/Surgical Date 10/13/16   Next MD Visit 01/08/2017                     [facility] Adult PT Treatment/Exercise - 01/07/17 0001      Ambulation/Gait   Stairs Yes   Stairs Assistance 6: Modified independent (Device/Increase time)   Stair  Management Technique One rail Right;Alternating pattern;Forwards   Number of Stairs 4   Height of Stairs 7     Knee/Hip Exercises: Aerobic   Stationary Bike Level 3 x 10 minutes.     Knee/Hip Exercises: Machines for Strengthening   Cybex Leg Press 2 pl, seat 6, 3 x10 reps     Knee/Hip Exercises: Standing   Terminal Knee Extension Limitations Pink XTS RLE x20 reps 5 sec hold     Knee/Hip Exercises: Supine   Straight Leg Raises Strengthening;Right;2 sets;10 reps   Straight Leg Raise with External Rotation Other (comment)  Attempted but unable secondary to pain     Knee/Hip Exercises: Sidelying   Hip ABduction Strengthening;Right;1 set;10 reps  stopped after beginning to hurt     Modalities   Modalities Electrical Stimulation;Vasopneumatic     Electrical Stimulation   Electrical Stimulation Location R knee   Electrical Stimulation Action IFC   Electrical Stimulation Parameters 1-10 hz x15 min   Electrical  Stimulation Goals Pain;Edema     Vasopneumatic   Number Minutes Vasopneumatic  15 minutes   Vasopnuematic Location  Knee   Vasopneumatic Pressure Medium   Vasopneumatic Temperature  34                     PT Long Term Goals - 01/07/17 1713      PT LONG TERM GOAL #1   Title Independent with a HEP.   Time 6   Period Weeks   Status Achieved     PT LONG TERM GOAL #2   Title Active right knee flexion to 120-125 degrees+ so the patient can perform functional tasks and do so with pain not > 2-3/10.   Baseline 120 deg 12/01/16 but pain varies from 2-6/10   Time 6   Period Weeks   Status Achieved  136 deg R knee flexion 01/07/2017     PT LONG TERM GOAL #3   Title Increase right knee strength to a solid 5/5 to provide good stability for accomplishment of functional activities   Baseline functional weakness with squats and step ups; MMT flex 5/5, ext 4+/5   Time 6   Period Weeks   Status Unable to assess  Unable to assess due to pain     PT LONG TERM GOAL #4   Title Perform a reciprocating stair gait with one railing with pain not > 2-3/10.   Time 6   Period Weeks   Status Achieved  3/10 R knee pain especially with ascending 01/07/2017               Plan - 01/07/17 1729    Clinical Impression Statement Patient has progressed well during treatment for R arthroscopic surgery although in the last week patient has began having R knee popping and catching sensation that is very painful to the point of bringing on tears. Patient educated throughout treatment to allow pain be the guide for the treatment to not reaggravate R knee pain. Patient indicated that pain is mostly in half circle pattern along medial aspect of R knee into inferior aspect of knee. Intermittantly during treatment patient also reported lateral knee pain as well. Patient unable to complete SLR with ER today secondary to pain and also stopped hip abduction short secondary to pain beginning. Able to  achieve all goals in clinic except for MMT although MMT goal not assessed secondary to pain. Normal modalities response noted following removal of the modalities.   Rehab Potential Excellent  PT Frequency 2x / week   PT Duration 4 weeks   PT Treatment/Interventions ADLs/Self Care Home Management;Cryotherapy;Electrical Stimulation;Gait training;Stair training;Functional mobility training;Therapeutic activities;Therapeutic exercise;Neuromuscular re-education;Patient/family education;Manual techniques;Vasopneumatic Device   PT Next Visit Plan Send MD note to Dr. Theda Sers but place on hold secondary to recent pain or D/C   PT Home Exercise Plan SLR, prone quad stretch, butterfly stretch   Consulted and Agree with Plan of Care Patient      Patient will benefit from skilled therapeutic intervention in order to improve the following deficits and impairments:  Abnormal gait, Pain, Decreased strength, Decreased mobility, Decreased range of motion, Difficulty walking  Visit Diagnosis: Chronic pain of right knee  Localized edema  Stiffness of right knee, not elsewhere classified     Problem List Patient Active Problem List   Diagnosis Date Noted  . PALPITATIONS 06/12/2010  . CHEST PAIN, PRECORDIAL 06/12/2010  . MIGRAINE HEADACHE 06/11/2010  . IBS 06/11/2010  . PULMONARY NODULE 09/17/2009  . ALLERGIC RHINITIS 09/11/2009    Ahmed Prima, PTA 01/07/17 5:54 PM Mali Applegate MPT [facility] [address] [place], [place], [address] Phone: [phone]   Fax:  [phone]  Name: [name] MRN: [id number] Date of Birth: 05-06-1981  PHYSICAL THERAPY DISCHARGE SUMMARY  Visits from Start of Care: 20.  Current functional level related to goals / functional outcomes: See above.   Remaining deficits: See goal section.   Education / Equipment: HEP. Plan: Patient agrees to discharge.  Patient goals were partially met. Patient is being  discharged due to being pleased with the current functional level.  ?????         Mali Applegate MPT

## 2017-10-19 DIAGNOSIS — F5105 Insomnia due to other mental disorder: Secondary | ICD-10-CM | POA: Insufficient documentation

## 2017-10-19 DIAGNOSIS — F99 Mental disorder, not otherwise specified: Secondary | ICD-10-CM | POA: Insufficient documentation

## 2017-10-19 DIAGNOSIS — F411 Generalized anxiety disorder: Secondary | ICD-10-CM | POA: Insufficient documentation

## 2018-04-18 ENCOUNTER — Encounter: Payer: Self-pay | Admitting: Internal Medicine

## 2018-06-20 ENCOUNTER — Encounter (HOSPITAL_COMMUNITY): Payer: Self-pay | Admitting: Emergency Medicine

## 2018-06-20 ENCOUNTER — Emergency Department (HOSPITAL_COMMUNITY): Payer: BLUE CROSS/BLUE SHIELD

## 2018-06-20 ENCOUNTER — Ambulatory Visit: Payer: BLUE CROSS/BLUE SHIELD | Admitting: Internal Medicine

## 2018-06-20 ENCOUNTER — Encounter: Payer: Self-pay | Admitting: Internal Medicine

## 2018-06-20 VITALS — BP 106/70 | HR 87 | Ht 62.0 in | Wt 230.0 lb

## 2018-06-20 DIAGNOSIS — Z79899 Other long term (current) drug therapy: Secondary | ICD-10-CM | POA: Diagnosis not present

## 2018-06-20 DIAGNOSIS — R053 Chronic cough: Secondary | ICD-10-CM | POA: Insufficient documentation

## 2018-06-20 DIAGNOSIS — R072 Precordial pain: Secondary | ICD-10-CM | POA: Insufficient documentation

## 2018-06-20 DIAGNOSIS — R0602 Shortness of breath: Secondary | ICD-10-CM | POA: Diagnosis present

## 2018-06-20 DIAGNOSIS — R0609 Other forms of dyspnea: Secondary | ICD-10-CM | POA: Diagnosis not present

## 2018-06-20 DIAGNOSIS — R05 Cough: Secondary | ICD-10-CM | POA: Diagnosis not present

## 2018-06-20 DIAGNOSIS — R0600 Dyspnea, unspecified: Secondary | ICD-10-CM | POA: Insufficient documentation

## 2018-06-20 LAB — BASIC METABOLIC PANEL
Anion gap: 8 (ref 5–15)
BUN: 10 mg/dL (ref 6–20)
CALCIUM: 8.6 mg/dL — AB (ref 8.9–10.3)
CO2: 24 mmol/L (ref 22–32)
CREATININE: 0.88 mg/dL (ref 0.44–1.00)
Chloride: 108 mmol/L (ref 98–111)
GFR calc Af Amer: >60 mL/min (ref >60)
Glucose, Bld: 98 mg/dL (ref 70–99)
Potassium: 3.7 mmol/L (ref 3.5–5.1)
Sodium: 140 mmol/L (ref 135–145)

## 2018-06-20 LAB — CBC
HCT: 36.1 % (ref 36.0–46.0)
Hemoglobin: 11.2 g/dL — ABNORMAL LOW (ref 12.0–15.0)
MCH: 28.4 pg (ref 26.0–34.0)
MCHC: 31 g/dL (ref 30.0–36.0)
MCV: 91.6 fL (ref 80.0–100.0)
PLATELETS: 319 10*3/uL (ref 150–400)
RBC: 3.94 MIL/uL (ref 3.87–5.11)
RDW: 13.5 % (ref 11.5–15.5)
WBC: 13.1 10*3/uL — ABNORMAL HIGH (ref 4.0–10.5)
nRBC: 0 % (ref 0.0–0.2)

## 2018-06-20 LAB — I-STAT BETA HCG BLOOD, ED (NOT ORDERABLE): I-stat hCG, quantitative: <5 m[IU]/mL (ref <5)

## 2018-06-20 LAB — POCT I-STAT TROPONIN I: Troponin i, poc: 0 ng/mL (ref 0.00–0.08)

## 2018-06-20 NOTE — Assessment & Plan Note (Addendum)
Onset early nov 2019 neg resp to rx for bronchitis/asthma  If CT chest neg for acute findings, most likely dx for cough is Upper airway cough syndrome (previously labeled PNDS),  is so named because it's frequently impossible to sort out how much is  CR/sinusitis with freq throat clearing (which can be related to primary GERD)   vs  causing  secondary (" extra esophageal")  GERD from wide swings in gastric pressure that occur with throat clearing, often  promoting self use of mint and menthol lozenges that reduce the lower esophageal sphincter tone and exacerbate the problem further in a cyclical fashion.   These are the same pts (now being labeled as having "irritable larynx syndrome" by some cough centers) who not infrequently have a history of having failed to tolerate ace inhibitors,  dry powder inhalers or biphosphonates or report having atypical/extraesophageal reflux symptoms that don't respond to standard doses of PPI  and are easily confused as having aecopd or asthma flares by even experienced allergists/ pulmonologists (myself included).   Of the three most common causes of  Sub-acute / recurrent or chronic cough, only one (GERD)  can actually contribute to/ trigger  the other two (asthma and post nasal drip syndrome)  and perpetuate the cylce of cough.  While not intuitively obvious, many patients with chronic low grade reflux do not cough until there is a primary insult that disturbs the protective epithelial barrier and exposes sensitive nerve endings.   This is typically viral but can due to PNDS and  either may apply here.   The point is that once this occurs, it is difficult to eliminate the cycle  using anything but a maximally effective acid suppression regimen at least in the short run, accompanied by an appropriate diet to address non acid GERD and control / eliminate the cough itself for at least 3 days with hycodan then return here to regroup    Total time devoted to counseling  >  50 % of initial 60 min office visit:  review case with pt/mother// observing ambulatory 02 sat study/  discussion of options/alternatives/ personally creating written customized instructions  in presence of pt  then going over those specific  Instructions directly with the pt including how to use all of the meds but in particular covering each new medication in detail and the difference between the maintenance= "automatic" meds and the prns using an action plan format for the latter (If this problem/symptom => do that organization reading Left to right).  Please see AVS from this visit for a full list of these instructions which I personally wrote for this pt and  are unique to this visit.

## 2018-06-20 NOTE — ED Triage Notes (Signed)
Pt reports had bronchitis for 3 weeks. Pt reports that she had SOB and chest pains that was on left under breast, moved to right side and now in center. Pt reports arms and legs hurt to touch. Pt was sent by [facility] for ruling out PE.

## 2018-06-20 NOTE — Patient Instructions (Signed)
Go to [place] er at [employer] - turn right out of our driveway then L at the second light on [address].  If they release you I recommend you start prilosec 20 mg Take 30- 60 min before your first and last meals of the day and suppress the cough x 3 days with hycodan then return here by the end of the week to regroup   GERD (REFLUX)  is an extremely common cause of respiratory symptoms just like yours , many times with no obvious heartburn at all.    It can be treated with medication, but also with lifestyle changes including elevation of the head of your bed (ideally with 6 -8inch blocks under the headboard of your bed),  Smoking cessation, avoidance of late meals, excessive alcohol, and avoid fatty foods, chocolate, peppermint, colas, red wine, and acidic juices such as orange juice.  NO MINT OR MENTHOL PRODUCTS SO NO COUGH DROPS  USE SUGARLESS CANDY INSTEAD (Jolley ranchers or Stover's or Life Savers) or even ice chips will also do - the key is to swallow to prevent all throat clearing. NO OIL BASED VITAMINS - use powdered substitutes.  Avoid fish oil when coughing.

## 2018-06-20 NOTE — Assessment & Plan Note (Signed)
Onset early Nov 2019 Chest CTa [place] 04/18/18  Motion degradation limited detecting peripheral pe/  3 mm nodules  06/20/2018   Walked RA x one lap = 250 ft - stopped due to  Desat to 87% > referred to ER to R/o PE   Assoc with first L base then R base pleuritic cp and hemoptysis in pt on BCP's is PE until proven otherwise   See cough

## 2018-06-20 NOTE — Progress Notes (Signed)
Joretta Bachelor, female    DOB: [birth year],    MRN: [id number]    Brief patient profile:  41 yowf never smoker healthy child  last Term IUP  2003 with baseline wt of 160 then AT wt = 175 doing great then July 2019 got hit in head at work with concussion / post concussion HA seen in ER not working out since then acutely worse 1st week Nov 2019 with wet cough and chest discomfort with cough to point of gagging  and > Nov 4th UC > rx pred / inhaler/tessalon > ER at [facility]  "Before tgiving"> CTa neg pe but c/w bronchitis > continued rx with prednisone / saba  But much worse x last week of dec 2019 resting sob and first L then R pleuritic cp with traces of hemoptysis so referred to pulmonary clinic 06/20/2018 by Dr   Sallyanne Havers    History of Present Illness  06/20/2018  Pulmonary/ 1st office eval/Ayannah Faddis  Chief Complaint  Patient presents with  . Pulmonary Consult    Referred by Dietrich Pates, NP. Pt c/o SOB and chest discomfort x 2 months. She states she gets winded walking short distances such as lobby to exam room today. She is SOB when she lies down and has been having to sleep propped up. She also c/o cough- mainly non prod but has produces minimal bright red blood before- last episode a few days ago.    Dyspnea:  At rest, worse room to room  Cough: ok at rest / worse when lie back < 60 degrees  Sleep: no sleeping due to cough > sob  SABA use: none Cp started on L now on R and generalized  with coughing fits    No obvious day to day or daytime variability or assoc excess/ purulent sputum or mucus plugs   chest tightness, subjective wheeze or overt sinus or hb symptoms.   Also denies any obvious fluctuation of symptoms with weather or environmental changes or other aggravating or alleviating factors except as outlined above   No unusual exposure hx or h/o childhood pna/ asthma or knowledge of premature birth.  Current Allergies, Complete Past Medical History, Past Surgical History, Family History,  and Social History were reviewed in [employer] record.  ROS  The following are not active complaints unless bolded Hoarseness, sore throat, dysphagia, dental problems, itching, sneezing,  nasal congestion or discharge of excess mucus or purulent secretions, ear ache,   fever, chills, sweats, unintended wt loss or wt gain, classically exertional cp,  orthopnea pnd or arm/hand swelling  or leg swelling, presyncope, palpitations, abdominal pain, anorexia, nausea, vomiting, diarrhea  or change in bowel habits or change in bladder habits, change in stools or change in urine, dysuria, hematuria,  rash, arthralgias, visual complaints, headache, numbness, weakness or ataxia or problems with walking or coordination,  change in mood or  memory.            Past Medical History:  Diagnosis Date  . Abnormal Pap smear of cervix   . Acne fulminans   . Allergic rhinitis   . Brain cyst   . Breast nodule   . Cervical polyp   . Epilepsy ([place])   . Fibromyalgia   . GERD (gastroesophageal reflux disease)   . IBS (irritable bowel syndrome)   . Kidney stones   . Melanoma of hip ([place])   . Menorrhagia   . Migraine headache   . 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  Previous stillbirth or demise, antepartum   . Pulmonary nodule    previous evaluation - benign  . Seizures ([place])    recently diagnosed with epilepsy  . Sepsis(995.91)   . Shortness of breath    due to wt  . Small bowel obstruction ([place])   . Staphylococcal infection     Outpatient Medications Prior to Visit  Medication Sig Dispense Refill  . albuterol (PROVENTIL HFA;VENTOLIN HFA) 108 (90 Base) MCG/ACT inhaler Inhale 2 puffs into the lungs every 4 (four) hours as needed.    . benzonatate (TESSALON) 100 MG capsule Take 1 capsule by mouth 3 (three) times daily as needed.    Marland Kitchen FLUoxetine (PROZAC) 20 MG tablet Take 20 mg by mouth every morning.  6  . lamoTRIgine (LAMICTAL) 100 MG tablet Take 200 mg by mouth 2 (two) times daily.      . Norgestimate-Ethinyl Estradiol Triphasic 0.18/0.215/0.25 MG-25 MCG tab Take 1 tablet by mouth daily.    Marland Kitchen topiramate (TOPAMAX) 50 MG tablet Take 1 tablet by mouth 2 (two) times daily.    . traZODone (DESYREL) 50 MG tablet Take 1 tablet by mouth at bedtime.    . eszopiclone (LUNESTA) 1 MG TABS tablet Take 1 mg by mouth at bedtime as needed for sleep. Take immediately before bedtime     Facility-Administered Medications Prior to Visit  Medication Dose Route Frequency Provider Last Rate Last Dose  . methylPREDNISolone acetate (DEPO-MEDROL) injection 80 mg  80 mg Intramuscular Once Dettinger, Fransisca Kaufmann, MD         Objective:     BP 106/70 (BP Location: Left Arm, Cuff Size: Normal)   Pulse 87   Ht 5\' 2"  (1.575 m)   Wt 230 lb (104.3 kg)   SpO2 97%   BMI 42.07 kg/m   SpO2: 97 %  RA   amb wf somewhat despondent re health    HEENT: nl dentition, turbinates bilaterally, and oropharynx. Nl external ear canals without cough reflex   NECK :  without JVD/Nodes/TM/ nl carotid upstrokes bilaterally   LUNGS: no acc muscle use,  Nl contour chest with decreased bs both bases   CV:  RRR  no s3 or murmur  - ? Increase  in P2, and no edema   ABD:  Obese/ soft and nontender with nl inspiratory excursion in the supine position. No bruits or organomegaly appreciated, bowel sounds nl  MS:  Nl gait/ ext warm without deformities, calf tenderness, cyanosis or clubbing No obvious joint restrictions   SKIN: warm and dry without lesions    NEURO:  alert, approp, nl sensorium with  no motor or cerebellar deficits apparent.     I personally reviewed images and agree with radiology impression as follows:  Chest CTa [place] 04/18/18  Motion degradation limited detecting peripheral pe/  3 mm nodules       Assessment   DOE (dyspnea on exertion) Onset early Nov 2019 Chest CTa [place] 04/18/18  Motion degradation limited detecting peripheral pe/  3 mm nodules  06/20/2018   Walked RA x one lap =  250 ft - stopped due to  Desat to 87% > referred to ER to R/o PE   Assoc with first L base then R base pleuritic cp and hemoptysis in pt on BCP's is PE until proven otherwise   See cough       Chronic cough Onset early nov 2019 neg resp to rx for bronchitis/asthma  If CT chest neg for acute findings, most likely  dx for cough is Upper airway cough syndrome (previously labeled PNDS),  is so named because it's frequently impossible to sort out how much is  CR/sinusitis with freq throat clearing (which can be related to primary GERD)   vs  causing  secondary (" extra esophageal")  GERD from wide swings in gastric pressure that occur with throat clearing, often  promoting self use of mint and menthol lozenges that reduce the lower esophageal sphincter tone and exacerbate the problem further in a cyclical fashion.   These are the same pts (now being labeled as having "irritable larynx syndrome" by some cough centers) who not infrequently have a history of having failed to tolerate ace inhibitors,  dry powder inhalers or biphosphonates or report having atypical/extraesophageal reflux symptoms that don't respond to standard doses of PPI  and are easily confused as having aecopd or asthma flares by even experienced allergists/ pulmonologists (myself included).   Of the three most common causes of  Sub-acute / recurrent or chronic cough, only one (GERD)  can actually contribute to/ trigger  the other two (asthma and post nasal drip syndrome)  and perpetuate the cylce of cough.  While not intuitively obvious, many patients with chronic low grade reflux do not cough until there is a primary insult that disturbs the protective epithelial barrier and exposes sensitive nerve endings.   This is typically viral but can due to PNDS and  either may apply here.   The point is that once this occurs, it is difficult to eliminate the cycle  using anything but a maximally effective acid suppression regimen at least in  the short run, accompanied by an appropriate diet to address non acid GERD and control / eliminate the cough itself for at least 3 days with hycodan then return here to regroup        Total time devoted to counseling  > 50 % of initial 60 min office visit:  review case with pt/mother/ discussion of options/alternatives/ observing ambulatory 02 sat study/ personally creating written customized instructions  in presence of pt  then going over those specific  Instructions directly with the pt including how to use all of the meds but in particular covering each new medication in detail and the difference between the maintenance= "automatic" meds and the prns using an action plan format for the latter (If this problem/symptom => do that organization reading Left to right).  Please see AVS from this visit for a full list of these instructions which I personally wrote for this pt and  are unique to this visit.      Christinia Gully, MD 06/20/2018

## 2018-06-21 ENCOUNTER — Encounter (HOSPITAL_COMMUNITY): Payer: Self-pay

## 2018-06-21 ENCOUNTER — Emergency Department (HOSPITAL_COMMUNITY)
Admission: EM | Admit: 2018-06-21 | Discharge: 2018-06-21 | Disposition: A | Discharge status: Home / Self Care | Payer: BLUE CROSS/BLUE SHIELD | Attending: Emergency Medicine | Admitting: Emergency Medicine

## 2018-06-21 ENCOUNTER — Emergency Department (HOSPITAL_COMMUNITY): Payer: BLUE CROSS/BLUE SHIELD

## 2018-06-21 DIAGNOSIS — R0602 Shortness of breath: Secondary | ICD-10-CM

## 2018-06-21 DIAGNOSIS — R072 Precordial pain: Secondary | ICD-10-CM

## 2018-06-21 MED ORDER — IOPAMIDOL (ISOVUE-370) INJECTION 76%
INTRAVENOUS | Status: AC
  Filled 2018-06-21: qty 100

## 2018-06-21 MED ORDER — SODIUM CHLORIDE (PF) 0.9 % IJ SOLN
INTRAMUSCULAR | Status: AC
  Filled 2018-06-21: qty 50

## 2018-06-21 MED ORDER — IOPAMIDOL (ISOVUE-370) INJECTION 76%
100.0000 mL | Freq: Once | INTRAVENOUS | Status: AC | PRN
  Administered 2018-06-21: 100 mL via INTRAVENOUS

## 2018-06-21 MED ORDER — HYDROCODONE-ACETAMINOPHEN 5-325 MG PO TABS
2.0000 | ORAL_TABLET | Freq: Once | ORAL | Status: AC
  Administered 2018-06-21: 2 via ORAL
  Filled 2018-06-21: qty 2

## 2018-06-21 NOTE — ED Provider Notes (Signed)
[facility] DEPT Provider Note   CSN: [id number] Arrival date & time: 06/20/18  1734     History   Chief Complaint Chief Complaint  Patient presents with  . Shortness of Breath  . Chest Pain    HPI [name] is a 38 y.o. female.  The history is provided by the patient and the spouse.  Shortness of Breath  Severity:  Moderate Onset quality:  Gradual Timing:  Intermittent Progression:  Worsening Chronicity:  Recurrent Relieved by:  Nothing Associated symptoms: abdominal pain, chest pain and cough   Associated symptoms: no fever   Chest Pain  Associated symptoms: abdominal pain, cough and shortness of breath   Associated symptoms: no fever    Patient with history of GERD, fibromyalgia presents with chest pain and shortness of breath.  She has had these episodes for a while.  She was just seen by pulmonology prior to the ER visit.  She reports at times she is having chest and abdominal pain.  She reports increasing shortness of breath.  Past Medical History:  Diagnosis Date  . Abnormal Pap smear of cervix   . Acne fulminans   . Allergic rhinitis   . Brain cyst   . Breast nodule   . Cervical polyp   . Epilepsy ([place])   . Fibromyalgia   . GERD (gastroesophageal reflux disease)   . IBS (irritable bowel syndrome)   . Kidney stones   . Melanoma of hip ([place])   . Menorrhagia   . Migraine headache   . 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 Previous stillbirth or demise, antepartum   . Pulmonary nodule    previous evaluation - benign  . Seizures ([place])    recently diagnosed with epilepsy  . Sepsis(995.91)   . Shortness of breath    due to wt  . Small bowel obstruction ([place])   . Staphylococcal infection     Patient Active Problem List   Diagnosis Date Noted  . DOE (dyspnea on exertion) 06/20/2018  . Chronic cough 06/20/2018  . PALPITATIONS 06/12/2010  . CHEST PAIN, PRECORDIAL 06/12/2010  . MIGRAINE HEADACHE 06/11/2010  . IBS 06/11/2010    . PULMONARY NODULE 09/17/2009  . ALLERGIC RHINITIS 09/11/2009    Past Surgical History:  Procedure Laterality Date  . BREAST LUMPECTOMY  2008   right breast - benign; needle - localized excision of right breast mass x2  . caesarean section    . HYSTEROSCOPY W/D&C  04/27/2011   Procedure: DILATATION AND CURETTAGE (D&C) /HYSTEROSCOPY;  Surgeon: Thurnell Lose, MD;  Location: [facility];  Service: Gynecology;  Laterality: N/A;  . MELANOMA EXCISION    . svd     x 1 - stillborn at 71 weeks     OB History    Gravida  2   Para  2   Term  2   Preterm      AB      Living  1     SAB      TAB      Ectopic      Multiple      Live Births  1            Home Medications    Prior to Admission medications   Medication Sig Start Date End Date Taking? Authorizing Provider  albuterol (PROVENTIL HFA;VENTOLIN HFA) 108 (90 Base) MCG/ACT inhaler Inhale 2 puffs into the lungs every 4 (four) hours as needed for wheezing.  06/02/18 06/02/19 Yes  [provider]  benzonatate (TESSALON) 100 MG capsule Take 200 mg by mouth 3 (three) times daily as needed for cough.  04/15/18 06/15/19 Yes [provider]  FLUoxetine (PROZAC) 20 MG tablet Take 20 mg by mouth every morning. 07/16/14  Yes [provider]  lamoTRIgine (LAMICTAL) 100 MG tablet Take 200 mg by mouth 2 (two) times daily.   Yes [provider]  Norgestimate-Ethinyl Estradiol Triphasic 0.18/0.215/0.25 MG-25 MCG tab Take 1 tablet by mouth daily.   Yes [provider]  topiramate (TOPAMAX) 50 MG tablet Take 1 tablet by mouth 2 (two) times daily. 03/07/18  Yes [provider]  traZODone (DESYREL) 50 MG tablet Take 1 tablet by mouth at bedtime. 05/31/17  Yes [provider]    Family History Family History  Problem Relation Age of Onset  . Emphysema Paternal Grandmother   . Heart disease Paternal Grandmother   . Emphysema Paternal Grandfather   . Cancer Paternal Grandfather         throat cancer    Social History Social History   Tobacco Use  . Smoking status: Never Smoker  . Smokeless tobacco: Never Used  Substance Use Topics  . Alcohol use: No  . Drug use: No     Allergies   Amitriptyline; Cymbalta [duloxetine hcl]; and Sulfonamide derivatives   Review of Systems Review of Systems  Constitutional: Negative for fever.  Respiratory: Positive for cough and shortness of breath.   Cardiovascular: Positive for chest pain.  Gastrointestinal: Positive for abdominal pain.  All other systems reviewed and are negative.    Physical Exam Updated Vital Signs BP 109/70   Pulse 79   Temp 98.9 F (37.2 C) (Oral)   Resp 15   Ht 1.575 m (5\' 2" )   LMP 06/08/2018 (Approximate) Comment: negative beta HCG 06/20/18  SpO2 94%   BMI 42.07 kg/m   Physical Exam CONSTITUTIONAL: Well developed/well nourished in no acute distress HEAD: Normocephalic/atraumatic EYES: EOMI/PERRL ENMT: Mucous membranes moist NECK: supple no meningeal signs SPINE/BACK:entire spine nontender CV: S1/S2 noted, no murmurs/rubs/gallops noted LUNGS: Lungs are clear to auscultation bilaterally, no apparent distress ABDOMEN: soft, nontender NEURO: Pt is awake/alert/appropriate, moves all extremitiesx4.  No facial droop.   EXTREMITIES: pulses normal/equal, full ROM SKIN: warm, color normal PSYCH: no abnormalities of mood noted, alert and oriented to situation   ED Treatments / Results  Labs (all labs ordered are listed, but only abnormal results are displayed) Labs Reviewed  BASIC METABOLIC PANEL - Abnormal; Notable for the following components:      Result Value   Calcium 8.6 (*)    All other components within normal limits  CBC - Abnormal; Notable for the following components:   WBC 13.1 (*)    Hemoglobin 11.2 (*)    All other components within normal limits  POCT I-STAT TROPONIN I  I-STAT BETA HCG BLOOD, ED (NOT ORDERABLE)    EKG EKG Interpretation  Date/Time:  Monday  June 20 2018 17:46:36 EST Ventricular Rate:  86 PR Interval:    QRS Duration: 100 QT Interval:  383 QTC Calculation: 459 R Axis:   8 Text Interpretation:  Sinus rhythm Probable left ventricular hypertrophy No significant change since last tracing Confirmed by Ripley Fraise [phone]) on 06/21/2018 12:50:20 AM   Radiology Dg Chest 2 View  Result Date: 06/20/2018 CLINICAL DATA:  Chest pain and shortness of breath EXAM: CHEST - 2 VIEW COMPARISON:  June 02, 2018 FINDINGS: No edema or consolidation. Heart size and pulmonary vascularity  are within normal limits. No adenopathy. No pneumothorax. No bone lesions. IMPRESSION: No edema or consolidation. Electronically Signed   By: Lowella Grip III M.D.   On: 06/20/2018 18:23   Ct Angio Chest Pe W And/or Wo Contrast  Result Date: 06/21/2018 CLINICAL DATA:  Initial evaluation for acute shortness of breath. EXAM: CT ANGIOGRAPHY CHEST WITH CONTRAST TECHNIQUE: Multidetector CT imaging of the chest was performed using the standard protocol during bolus administration of intravenous contrast. Multiplanar CT image reconstructions and MIPs were obtained to evaluate the vascular anatomy. CONTRAST:  173mL ISOVUE-370 IOPAMIDOL (ISOVUE-370) INJECTION 76% COMPARISON:  Prior radiograph from 06/20/2017. FINDINGS: Cardiovascular: Intrathoracic aorta of normal caliber without aneurysm or other acute finding. Visualized great vessels within normal limits. Mild cardiomegaly. No pericardial effusion. Pulmonary arterial tree adequately opacified for evaluation. Main pulmonary artery dilated up to 3.7 cm. Which she no filling defect to suggest acute pulmonary embolism. Re-formatted imaging confirms these findings. Mediastinum/Nodes: Partially visualized thyroid within normal limits. No enlarged mediastinal, hilar, or axillary lymph nodes identified. Esophagus within normal limits. Lungs/Pleura: Tracheobronchial tree intact and patent. Lungs normally inflated bilaterally.  Mild scattered subsegmental atelectatic changes seen dependently within the visualized lung bases. No focal infiltrates to suggest pneumonia. No pulmonary edema or pleural effusion. No pneumothorax. 6 mm nodule present at the peripheral left lower lobe (series 8, image 88), indeterminate. No other worrisome pulmonary nodule or mass. Upper Abdomen: Visualized upper abdomen demonstrates no acute finding. Musculoskeletal: No acute osseous abnormality. No discrete lytic or blastic osseous lesions. Review of the MIP images confirms the above findings. IMPRESSION: 1. No CT evidence for acute pulmonary embolism. 2. No other acute cardiopulmonary abnormality identified. 3. **An incidental finding of potential clinical significance has been found. 6 mm left lower lobe nodule, indeterminate. Non-contrast chest CT at 6-12 months is recommended. If the nodule is stable at time of repeat CT, then future CT at 18-24 months (from today's scan) is considered optional for low-risk patients, but is recommended for high-risk patients. This recommendation follows the consensus statement: Guidelines for Management of Incidental Pulmonary Nodules Detected on CT Images: From the Fleischner Society 2017; Radiology 2017; 284:228-243.** Electronically Signed   By: Jeannine Boga M.D.   On: 06/21/2018 02:34    Procedures Procedures (including critical care time)  Medications Ordered in ED Medications  sodium chloride (PF) 0.9 % injection (has no administration in time range)  iopamidol (ISOVUE-370) 76 % injection (has no administration in time range)  HYDROcodone-acetaminophen (NORCO/VICODIN) 5-325 MG per tablet 2 tablet (2 tablets Oral Given 06/21/18 0105)  iopamidol (ISOVUE-370) 76 % injection 100 mL (100 mLs Intravenous Contrast Given 06/21/18 0130)     Initial Impression / Assessment and Plan / ED Course  I have reviewed the triage vital signs and the nursing notes.  Pertinent labs & imaging results that were available  during my care of the patient were reviewed by me and considered in my medical decision making (see chart for details).     Patient presented from pulmonology offices for evaluation of PE.  She has had ongoing episodes of chest and abdominal pain as well as shortness of breath for a while. In fact she had a negative CT chest done in outside hospital in November 2019. However at the pulmonology office she had hypoxia with ambulation and was requesting go the ER to rule out a PE. On my exam patient is in no acute distress and no hypoxia on room air.  She reports at times she has pain  all over her body.  She would not have pain in her chest as well as around her umbilicus.  CT chest negative for acute PE.  In the report it does mention possible pulmonary nodule, this was discussed with patient and her husband and they will follow-up with this as an outpatient.  She has been instructed to start Prilosec by pulmonologist.  She already has close follow-up arranged.  Final Clinical Impressions(s) / ED Diagnoses   Final diagnoses:  Precordial pain  Shortness of breath    ED Discharge Orders    None       Ripley Fraise, MD 06/21/18 [phone]

## 2018-06-21 NOTE — ED Notes (Signed)
Pt placed on cardiac monitoring and into a gown.

## 2018-06-22 ENCOUNTER — Telehealth: Payer: Self-pay | Admitting: Internal Medicine

## 2018-06-22 NOTE — Telephone Encounter (Signed)
Call made to patient, she states when she ambulates her oxygen is dropping. She saw Dr. Melvyn Novas Monday and she was sent to ED to be tested for blood clots and this ws negative. She has been taking prilosec. She states she is still SOB and her chest is still tight and she feels a lot of pressure. She can no longer wear her regular bra, she has to wear a sports bra to help with the pressure she is feeling in her chest. She states she had a CXR done last night. She is asking that we please call her in something. States she was just her Monday and has been to ER. Denies fever. States its hard to take a deep breath. The only inhaler she has is albuterol which she states is not helpful.   SG please advise as MW is not in clinic this AM. Allergies Amitriptyline, Cymbalta, and Sulfonamide.

## 2018-06-22 NOTE — Telephone Encounter (Signed)
I have called and spoken with the patient. She states she still has shortness of breath and cough with minimal exertion. She was seen by Dr. Melvyn Novas 06/20/2018 and sent to the ED for CTA, which was negative. She states she wants something called in, but I explained that we do not know the cause of her SOB, so she needs to be seen. I have scheduled her with Dr. Melvyn Novas 06/23/2017 at 3:15 for follow up.She verbalized understanding.

## 2018-06-23 ENCOUNTER — Ambulatory Visit (INDEPENDENT_AMBULATORY_CARE_PROVIDER_SITE_OTHER): Payer: BLUE CROSS/BLUE SHIELD | Admitting: Internal Medicine

## 2018-06-23 ENCOUNTER — Encounter: Payer: Self-pay | Admitting: Internal Medicine

## 2018-06-23 ENCOUNTER — Encounter: Payer: Self-pay | Admitting: *Deleted

## 2018-06-23 DIAGNOSIS — R05 Cough: Secondary | ICD-10-CM | POA: Diagnosis not present

## 2018-06-23 DIAGNOSIS — R053 Chronic cough: Secondary | ICD-10-CM

## 2018-06-23 DIAGNOSIS — R0609 Other forms of dyspnea: Secondary | ICD-10-CM

## 2018-06-23 DIAGNOSIS — R0600 Dyspnea, unspecified: Secondary | ICD-10-CM

## 2018-06-23 MED ORDER — GABAPENTIN 100 MG PO CAPS: 100.0000 mg | ORAL_CAPSULE | Freq: Three times a day (TID) | ORAL | 2 refills | Status: DC

## 2018-06-23 MED ORDER — PANTOPRAZOLE SODIUM 40 MG PO TBEC: 40.0000 mg | DELAYED_RELEASE_TABLET | Freq: Every day | ORAL | 2 refills | Status: DC

## 2018-06-23 NOTE — Patient Instructions (Addendum)
Take a tsp or two of cough mediccine  every 4 hours until no cough at all x 3 days then stop it  Protonix 40 mg Take 30- 60 min before your first and last meals of the day   Gabapentin 100 mg three times a day=  bfast lunch supper   GERD (REFLUX)  is an extremely common cause of respiratory symptoms just like yours , many times with no obvious heartburn at all.    It can be treated with medication, but also with lifestyle changes including elevation of the head of your bed (ideally with 6 -8inch blocks under the headboard of your bed),  Smoking cessation, avoidance of late meals, excessive alcohol, and avoid fatty foods, chocolate, peppermint, colas, red wine, and acidic juices such as orange juice.  NO MINT OR MENTHOL PRODUCTS SO NO COUGH DROPS  USE SUGARLESS CANDY INSTEAD (Jolley ranchers or Stover's or Life Savers) or even ice chips will also do - the key is to swallow to prevent all throat clearing. NO OIL BASED VITAMINS - use powdered substitutes.  Avoid fish oil when coughing.  No work until [employer] 06/26/18   Please remember to go to the lab department   for your tests - we will call you with the results when they are available.      Please schedule a follow up office visit in 2  weeks, sooner if needed  with all medications /inhalers/ solutions in hand so we can verify exactly what you are taking. This includes all medications from all doctors and over the counters Add:  Echo on return for ? [place]

## 2018-06-23 NOTE — Progress Notes (Signed)
[name], female    DOB: 13-Jun-1981,    MRN: [id number]    Brief patient profile:  1 yowf never smoker healthy child  last Term IUP  2003 with baseline wt of 160 then AT wt = 175 doing great then July 2019 got hit in head at work with concussion / post concussion HA seen in ER not working out since then acutely worse 1st week Nov 2019 with wet cough and chest discomfort with cough to point of gagging  and > Nov 4th UC > rx pred / inhaler/tessalon > ER at [facility]  "Before tgiving"> CTa neg pe but c/w bronchitis > continued rx with prednisone / saba  But much worse x last week of dec 2019 resting sob and first L then R pleuritic cp with traces of hemoptysis so referred to pulmonary clinic 06/20/2018 by Dr   Sallyanne Havers    History of Present Illness  06/20/2018  Pulmonary/ 1st office eval/Norbert Malkin  Chief Complaint  Patient presents with  . Pulmonary Consult    Referred by Dietrich Pates, NP. Pt c/o SOB and chest discomfort x 2 months. She states she gets winded walking short distances such as lobby to exam room today. She is SOB when she lies down and has been having to sleep propped up. She also c/o cough- mainly non prod but has produces minimal bright red blood before- last episode a few days ago.    Dyspnea:  At rest, worse room to room  Cough: ok at rest / worse when lie back < 60 degrees  Sleep: no sleeping due to cough > sob  SABA use: none Cp started on L now on R and generalized  with coughing fits  rec  start prilosec 20 mg Take 30- 60 min before your first and last meals of the day and suppress the cough x 3 days with hycodan then return here by the end of the week to regroup  GERD  Diet  CTa > neg     06/23/2018 acute extended ov/Barnet Benavides re: cough since early Nov 2019  Chief Complaint  Patient presents with  . Acute Visit    pt went to [facility] ED after 1/6 ov, no DVT/PE found in workup.  pt still has sob with anyexertion or around heat.    cough to point of gagging. Sob room to room  Better  p cough med but taking very little  Cp just with coughing fits/ generalized anterior   No obvious day to day or daytime variability or assoc excess/ purulent sputum or mucus plugs or hemoptysis or   chest tightness, subjective wheeze or overt sinus or hb symptoms.   Sleeping p cough meds without nocturnal  or early am exacerbation  of respiratory  c/o's or need for noct saba. Also denies any obvious fluctuation of symptoms with weather or environmental changes or other aggravating or alleviating factors except as outlined above   No unusual exposure hx or h/o childhood pna/ asthma or knowledge of premature birth.  Current Allergies, Complete Past Medical History, Past Surgical History, Family History, and Social History were reviewed in [employer] record.  ROS  The following are not active complaints unless bolded Hoarseness, sore throat, dysphagia, dental problems, itching, sneezing,  nasal congestion or discharge of excess mucus or purulent secretions, ear ache,   fever, chills, sweats, unintended wt loss or wt gain, classically pleuritic or exertional cp,  orthopnea pnd or arm/hand swelling  or leg swelling, presyncope,  palpitations, abdominal pain, anorexia, nausea, vomiting, diarrhea  or change in bowel habits or change in bladder habits, change in stools or change in urine, dysuria, hematuria,  rash, arthralgias, visual complaints, headache, numbness, weakness or ataxia or problems with walking or coordination,  change in mood or  memory.        Current Meds  Medication Sig  . FLUoxetine (PROZAC) 20 MG tablet Take 20 mg by mouth every morning.  . lamoTRIgine (LAMICTAL) 100 MG tablet Take 200 mg by mouth 2 (two) times daily.  . Norgestimate-Ethinyl Estradiol Triphasic 0.18/0.215/0.25 MG-25 MCG tab Take 1 tablet by mouth daily.  Marland Kitchen topiramate (TOPAMAX) 50 MG tablet Take 1 tablet by mouth 2 (two) times daily.  . traZODone (DESYREL) 50 MG tablet Take 1 tablet by mouth at  bedtime.  . [  albuterol (PROVENTIL HFA;VENTOLIN HFA) 108 (90 Base) MCG/ACT inhaler Inhale 2 puffs into the lungs every 4 (four) hours as needed for wheezing.   . [  benzonatate (TESSALON) 100 MG capsule Take 200 mg by mouth 3 (three) times daily as needed for cough.             Objective:    amb somber obese wf nad with harsh barking upper airway pattern dry cough   Wt Readings from Last 3 Encounters:  06/23/18 230 lb 12.8 oz (104.7 kg)  06/20/18 230 lb (104.3 kg)  08/18/16 227 lb 2 oz (103 kg)     Vital signs reviewed - Note on arrival 02 sats  100% on RA     HEENT: nl dentition, turbinates bilaterally, and oropharynx. Nl external ear canals without cough reflex   NECK :  without JVD/Nodes/TM/ nl carotid upstrokes bilaterally   LUNGS: no acc muscle use,  Nl contour chest which is clear to A and P bilaterally without cough on insp or exp maneuvers   CV:  RRR  no s3 or murmur or increase in P2, and no edema   ABD:  soft and nontender with nl inspiratory excursion in the supine position. No bruits or organomegaly appreciated, bowel sounds nl  MS:  Nl gait/ ext warm without deformities, calf tenderness, cyanosis or clubbing No obvious joint restrictions   SKIN: warm and dry without lesions    NEURO:  alert, approp, nl sensorium with  no motor or cerebellar deficits apparent.      Labs ordered/ reviewed:      Chemistry      Component Value Date/Time   NA 140 06/20/2018 1807   NA 143 11/01/2013 1707   K 3.7 06/20/2018 1807   CL 108 06/20/2018 1807   CO2 24 06/20/2018 1807   BUN 10 06/20/2018 1807   BUN 10 11/01/2013 1707   CREATININE 0.88 06/20/2018 1807      Component Value Date/Time   CALCIUM 8.6 (L) 06/20/2018 1807   ALKPHOS 87 11/01/2013 2340   AST 15 11/01/2013 2340   ALT 12 11/01/2013 2340   BILITOT <0.2 (L) 11/01/2013 2340        Lab Results  Component Value Date   WBC 8.9 06/23/2018   HGB 11.5 (L) 06/23/2018   HCT 34.9 (L) 06/23/2018   MCV  86.7 06/23/2018   PLT 366.0 06/23/2018       EOS  0.3                                     06/23/2018      Lab Results  Component Value Date   TSH 3.19 06/23/2018     Lab Results  Component Value Date   PROBNP 24.0 06/23/2018       Lab Results  Component Value Date   ESRSEDRATE 30 (H) 06/23/2018         Labs ordered 06/23/2018  Allergy profile          I personally reviewed images and agree with radiology impression as follows:   Chest CT a  06/21/17 1. No CT evidence for acute pulmonary embolism. Mild PA enlargement noted  2. No other acute cardiopulmonary abnormality identified. 3. **An incidental finding of potential clinical significance has been found. 6 mm left lower lobe nodule, indeterminate. Non-contrast chest CT at 6-12 months is recommended                  Assessment

## 2018-06-24 ENCOUNTER — Telehealth: Payer: Self-pay | Admitting: Internal Medicine

## 2018-06-24 LAB — RESPIRATORY ALLERGY PROFILE REGION II ~~LOC~~
Allergen, A. alternata, m6: <0.1 kU/L
Allergen, Cedar tree, t12: <0.1 kU/L
Allergen, Comm Silver Birch, t9: <0.1 kU/L
Allergen, Cottonwood, t14: <0.1 kU/L
Allergen, D pternoyssinus,d7: <0.1 kU/L
Allergen, Mouse Urine Protein, e78: <0.1 kU/L
Allergen, Mulberry, t76: <0.1 kU/L
Allergen, Oak,t7: <0.1 kU/L
Aspergillus fumigatus, m3: <0.1 kU/L
Bermuda Grass: <0.1 kU/L
Box Elder IgE: <0.1 kU/L
CLADOSPORIUM HERBARUM (M2) IGE: <0.1 kU/L
CLASS: 0
CLASS: 0
CLASS: 0
CLASS: 0
CLASS: 0
COMMON RAGWEED (SHORT) (W1) IGE: <0.1 kU/L
Cat Dander: <0.1 kU/L
Class: 0
Class: 0
Class: 0
Class: 0
Class: 0
Class: 0
Class: 0
Class: 0
Class: 0
Class: 0
Class: 0
Class: 0
Class: 0
Class: 0
Class: 0
Class: 0
Class: 0
Class: 0
Class: 0
Cockroach: <0.1 kU/L
D. farinae: <0.1 kU/L
Dog Dander: <0.1 kU/L
Elm IgE: <0.1 kU/L
IgE (Immunoglobulin E), Serum: 11 kU/L (ref <=114)
Johnson Grass: <0.1 kU/L
Pecan/Hickory Tree IgE: <0.1 kU/L
Rough Pigweed  IgE: <0.1 kU/L
Sheep Sorrel IgE: <0.1 kU/L
Timothy Grass: <0.1 kU/L

## 2018-06-24 LAB — CBC WITH DIFFERENTIAL/PLATELET
Basophils Absolute: 0.1 10*3/uL (ref 0.0–0.1)
Basophils Relative: 0.6 % (ref 0.0–3.0)
Eosinophils Absolute: 0.3 10*3/uL (ref 0.0–0.7)
Eosinophils Relative: 2.8 % (ref 0.0–5.0)
HEMATOCRIT: 34.9 % — AB (ref 36.0–46.0)
HEMOGLOBIN: 11.5 g/dL — AB (ref 12.0–15.0)
Lymphocytes Relative: 33.8 % (ref 12.0–46.0)
Lymphs Abs: 3 10*3/uL (ref 0.7–4.0)
MCHC: 33 g/dL (ref 30.0–36.0)
MCV: 86.7 fl (ref 78.0–100.0)
Monocytes Absolute: 0.6 10*3/uL (ref 0.1–1.0)
Monocytes Relative: 6.7 % (ref 3.0–12.0)
Neutro Abs: 5 10*3/uL (ref 1.4–7.7)
Neutrophils Relative %: 56.1 % (ref 43.0–77.0)
Platelets: 366 10*3/uL (ref 150.0–400.0)
RBC: 4.02 Mil/uL (ref 3.87–5.11)
RDW: 14.5 % (ref 11.5–15.5)
WBC: 8.9 10*3/uL (ref 4.0–10.5)

## 2018-06-24 LAB — BRAIN NATRIURETIC PEPTIDE: PRO B NATRI PEPTIDE: 24 pg/mL (ref 0.0–100.0)

## 2018-06-24 LAB — INTERPRETATION:

## 2018-06-24 LAB — TSH: TSH: 3.19 u[IU]/mL (ref 0.35–4.50)

## 2018-06-24 LAB — SEDIMENTATION RATE: Sed Rate: 30 mm/hr — ABNORMAL HIGH (ref 0–20)

## 2018-06-24 MED ORDER — HYDROCODONE-HOMATROPINE 5-1.5 MG/5ML PO SYRP: 5.0000 mL | ORAL_SOLUTION | Freq: Four times a day (QID) | ORAL | 0 refills | Status: DC | PRN

## 2018-06-24 NOTE — Telephone Encounter (Signed)
Called and spoke with patient, she was seen 06/23/2018 by MW and was given instructions below.        Instructions   Take a tsp or two of cough mediccine  every 4 hours until no cough at all x 3 days then stop it  Protonix 40 mg Take 30- 60 min before your first and last meals of the day       ___________________________________________________________________________________________  Patient stated that she thought that she would have enough cough syrup to run her through the weekend but she will not. She is requesting a prescription to be called in. MW please advise, thank you.

## 2018-06-24 NOTE — Telephone Encounter (Signed)
Done - let her know she can pick it up this afternoon

## 2018-06-24 NOTE — Telephone Encounter (Signed)
Spoke with patient. She is aware that MW has already called in the cough syrup. She verbalized understanding on the instructions. Nothing further needed at time of call.

## 2018-06-24 NOTE — Progress Notes (Signed)
Spoke with pt and notified of results per Dr. Wert. Pt verbalized understanding and denied any questions. 

## 2018-06-25 ENCOUNTER — Encounter: Payer: Self-pay | Admitting: Internal Medicine

## 2018-06-25 NOTE — Assessment & Plan Note (Signed)
Body mass index is 42.21 kg/m.   Lab Results  Component Value Date   TSH 3.19 06/23/2018    Aggravated by repeat steroid exp for cough and now contributing to gerd risk/ doe/reviewed the need and the process to achieve and maintain neg calorie balance > defer f/u primary care including intermittently monitoring thyroid status        I had an extended discussion with the patient reviewing all relevant studies completed to date and  lasting 25 minutes of a 40  minute acute office visit addressing severe non-specific but potentially very serious refractory respiratory symptoms of uncertain and potentially multiple  Etiologies.  Direct observation of amb 02 sat study extended face to face time for this visit.   Each maintenance medication was reviewed in detail including most importantly the difference between maintenance and prns and under what circumstances the prns are to be triggered using an action plan format that is not reflected in the computer generated alphabetically organized AVS.    Please see AVS for specific instructions unique to this office visit that I personally wrote and verbalized to the the pt in detail and then reviewed with pt  by my nurse highlighting any changes in therapy/plan of care  recommended at today's visit.

## 2018-06-25 NOTE — Assessment & Plan Note (Addendum)
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  Note that the three most common causes of  Sub-acute / recurrent or chronic cough, only one (GERD)  can actually contribute to/ trigger  the other two (asthma and post nasal drip syndrome)  and perpetuate the cylce of cough.  While not intuitively obvious, many patients with chronic low grade reflux do not cough until there is a primary insult that disturbs the protective epithelial barrier and exposes sensitive nerve endings.   This is typically viral (as was originally likely  her case) but can due to PNDS and  either may apply here.     >>>  The point is that once this occurs, it is difficult to eliminate the cycle  using anything but a maximally effective acid suppression regimen at least in the short run, accompanied by an appropriate diet to address non acid GERD and control / eliminate the cough itself for at least 3 days with hycodan cough syrup then stop rx and add low dose gabapentin to suppress cough long term     Discussed in detail all the  indications, usual  risks and alternatives  relative to the benefits with patient who agrees to proceed with rx as outlined.

## 2018-06-25 NOTE — Assessment & Plan Note (Signed)
Onset early Nov 2019 Chest CTa [facility] 04/18/18  Motion degradation limited detecting peripheral pe/  3 mm nodules  06/20/2018   Walked RA x one lap = 250 ft - stopped due to  Desat to 87% > CTa 06/21/18 neg pe/ ild but Mild PA enlargement noted  - 06/23/2018   Walked RA  2 laps @ 240ft each @ avg  pace  stopped due to sob no desat - Echo    Symptoms are markedly disproportionate to objective findings and not clear to what extent this is actually a pulmonary  problem but pt does appear to have difficult to sort out respiratory symptoms of unknown origin for which  DDX  = almost all start with A and  include Adherence, Ace Inhibitors, Acid Reflux, Active Sinus Disease, Alpha 1 Antitripsin deficiency, Anxiety masquerading as Airways dz,  ABPA,  Allergy(esp in young), Aspiration (esp in elderly), Adverse effects of meds,  Active smoking or Vaping, A bunch of PE's/clot burden (a few small clots can't cause this syndrome unless there is already severe underlying pulm or vascular dz with poor reserve),  Anemia or thyroid disorder, plus two Bs  = Bronchiectasis and Beta blocker use..and one C= CHF     Adherence is always the initial "prime suspect" and is a multilayered concern that requires a "trust but verify" approach in every patient - starting with knowing how to use medications, especially inhalers, correctly, keeping up with refills and understanding the fundamental difference between maintenance and prns vs those medications only taken for a very short course and then stopped and not refilled.  - return with all meds in hand using a trust but verify approach to confirm accurate Medication  Reconciliation The principal here is that until we are certain that the  patients are doing what we've asked, it makes no sense to ask them to do more.   ? Acid (or non-acid) GERD > always difficult to exclude as up to 75% of pts in some series report no assoc GI/ Heartburn symptoms> rec max (24h)  acid suppression and  diet restrictions/ reviewed and instructions given in writing.   ? Allergy > send allergy profile   ? Anxiety /depression/ deconditioning > usually at the bottom of this list of usual suspects but should be much higher on this pt's based on H and P and note   psychotropics and may interfere with adherence and also interpretation of response or lack thereof to symptom management which can be quite subjective.   ? Anemia / thyroid dz  > ruled out today   ? A bunch of PE's > at risk as on bcps but neg CTa noted   ? CHF >  bnp reassuring  Could still have PAH based on CTa> needs echo to complete the w/u

## 2018-06-27 ENCOUNTER — Telehealth: Payer: Self-pay | Admitting: *Deleted

## 2018-06-27 DIAGNOSIS — R0600 Dyspnea, unspecified: Secondary | ICD-10-CM

## 2018-06-27 DIAGNOSIS — R0609 Other forms of dyspnea: Principal | ICD-10-CM

## 2018-06-27 NOTE — Telephone Encounter (Signed)
Spoke with the pt and notified of recs per MW  She verbalized understanding  Order sent to [facility] 

## 2018-06-27 NOTE — Telephone Encounter (Signed)
-----   Message from Tanda Rockers, MD sent at 06/25/2018  5:28 AM EST ----- To complete the w/u needs echo on return dx doe

## 2018-06-30 ENCOUNTER — Other Ambulatory Visit (HOSPITAL_COMMUNITY): Payer: BLUE CROSS/BLUE SHIELD

## 2018-07-04 ENCOUNTER — Other Ambulatory Visit: Payer: Self-pay

## 2018-07-04 ENCOUNTER — Ambulatory Visit (HOSPITAL_COMMUNITY): Payer: BLUE CROSS/BLUE SHIELD | Attending: Cardiology

## 2018-07-04 ENCOUNTER — Telehealth: Payer: Self-pay | Admitting: Internal Medicine

## 2018-07-04 DIAGNOSIS — R0600 Dyspnea, unspecified: Secondary | ICD-10-CM

## 2018-07-04 DIAGNOSIS — R0609 Other forms of dyspnea: Secondary | ICD-10-CM

## 2018-07-04 NOTE — Progress Notes (Signed)
Spoke with pt and notified of results per Dr. Wert. Pt verbalized understanding and denied any questions. 

## 2018-07-04 NOTE — Telephone Encounter (Signed)
No more narcs/ ov with all meds >>> regroup at ov with all meds in hand

## 2018-07-04 NOTE — Telephone Encounter (Signed)
Spoke with the pt  She is asking for refill on hycodan to be sent to [facility] in [place]  She states she can only take this on the weekends b/c it makes her too sleepy during the wk  She states the only time she does not have the cough is during the wkend while on hycodan  She states she is following the GERD diet, and taking the pantoprazole and gabapentin as prescribed  Her breathing is not any better or worse since her last visit She has ov with MW on 07/08/18 and declined any sooner appt time  Please advise thanks

## 2018-07-04 NOTE — Telephone Encounter (Signed)
Spoke with pt. She is aware of MW's response. Pt was already scheduled for an appointment with MW on 07/08/2018. Nothing further was needed at this time.

## 2018-07-08 ENCOUNTER — Ambulatory Visit (INDEPENDENT_AMBULATORY_CARE_PROVIDER_SITE_OTHER): Payer: BLUE CROSS/BLUE SHIELD | Admitting: Internal Medicine

## 2018-07-08 ENCOUNTER — Encounter: Payer: Self-pay | Admitting: Internal Medicine

## 2018-07-08 VITALS — BP 102/66 | HR 92 | Ht 62.0 in | Wt 231.0 lb

## 2018-07-08 DIAGNOSIS — R911 Solitary pulmonary nodule: Secondary | ICD-10-CM | POA: Diagnosis not present

## 2018-07-08 DIAGNOSIS — R300 Dysuria: Secondary | ICD-10-CM | POA: Diagnosis not present

## 2018-07-08 DIAGNOSIS — R05 Cough: Secondary | ICD-10-CM | POA: Diagnosis not present

## 2018-07-08 DIAGNOSIS — R0600 Dyspnea, unspecified: Secondary | ICD-10-CM

## 2018-07-08 DIAGNOSIS — R053 Chronic cough: Secondary | ICD-10-CM

## 2018-07-08 DIAGNOSIS — R0609 Other forms of dyspnea: Secondary | ICD-10-CM | POA: Diagnosis not present

## 2018-07-08 LAB — URINALYSIS, ROUTINE W REFLEX MICROSCOPIC
Bilirubin Urine: NEGATIVE
Ketones, ur: NEGATIVE
Leukocytes, UA: NEGATIVE
NITRITE: NEGATIVE
Specific Gravity, Urine: <=1.005 — AB (ref 1.000–1.030)
Total Protein, Urine: NEGATIVE
Urine Glucose: NEGATIVE
Urobilinogen, UA: 0.2 (ref 0.0–1.0)
pH: 6.5 (ref 5.0–8.0)

## 2018-07-08 MED ORDER — GABAPENTIN 300 MG PO CAPS: 300.0000 mg | ORAL_CAPSULE | Freq: Three times a day (TID) | ORAL | 2 refills | Status: DC

## 2018-07-08 NOTE — Progress Notes (Signed)
Spoke with pt and notified of results per Dr. Wert. Pt verbalized understanding and denied any questions. 

## 2018-07-08 NOTE — Patient Instructions (Addendum)
Gabapentin 100mg  take 2 three times a day until the gabapentin is used up then new prescription for 300 mg three times a day   Stay on protonix Take 30- 60 min before your first and last meals of the day   For drainage / throat tickle try take CHLORPHENIRAMINE  4 mg(chlortabs at [facility]) - take one every 4 hours as needed - available over the counter- may cause drowsiness so start with just a bedtime dose or two and see how you tolerate it before trying in daytime    GERD (REFLUX)  is an extremely common cause of respiratory symptoms just like yours , many times with no obvious heartburn at all.    It can be treated with medication, but also with lifestyle changes including elevation of the head of your bed (ideally with 6 -8inch blocks under the headboard of your bed),  Smoking cessation, avoidance of late meals, excessive alcohol, and avoid fatty foods, chocolate, peppermint, colas, red wine, and acidic juices such as orange juice.  NO MINT OR MENTHOL PRODUCTS SO NO COUGH DROPS  USE SUGARLESS CANDY INSTEAD (Jolley ranchers or Stover's or Life Savers) or even ice chips will also do - the key is to swallow to prevent all throat clearing. NO OIL BASED VITAMINS - use powdered substitutes.  Avoid fish oil when coughing.   Please remember to go to the lab department   for your tests - we will call you with the results when they are available.      Please schedule a follow up office visit in 4 weeks, sooner if needed  with all medications /inhalers/ solutions in hand so we can verify exactly what you are taking. This includes all medications from all doctors and over the counters

## 2018-07-08 NOTE — Progress Notes (Signed)
[name], female    DOB: 03-Sep-1980,    MRN: [id number]    Brief patient profile:  62 yowf never smoker healthy child  last Term IUP  2003 with baseline wt of 160 then AT wt = 175 doing great then July 2019 got hit in head at work with concussion / post concussion HA seen in ER not working out since then acutely worse 1st week Nov 2019 with wet cough and chest discomfort with cough to point of gagging  and > Nov 4th UC > rx pred / inhaler/tessalon > ER at [facility]  "Before tgiving"> CTa neg pe but c/w bronchitis > continued rx with prednisone / saba  But much worse x last week of dec 2019 resting sob and first L then R pleuritic cp with traces of hemoptysis so referred to pulmonary clinic 06/20/2018 by Dr   Sallyanne Havers    History of Present Illness  06/20/2018  Pulmonary/ 1st office eval/Maclaine Ahola  Chief Complaint  Patient presents with  . Pulmonary Consult    Referred by Dietrich Pates, NP. Pt c/o SOB and chest discomfort x 2 months. She states she gets winded walking short distances such as lobby to exam room today. She is SOB when she lies down and has been having to sleep propped up. She also c/o cough- mainly non prod but has produces minimal bright red blood before- last episode a few days ago.    Dyspnea:  At rest, worse room to room  Cough: ok at rest / worse when lie back < 60 degrees  Sleep: no sleeping due to cough > sob  SABA use: none Cp started on L now on R and generalized  with coughing fits  rec  start prilosec 20 mg Take 30- 60 min before your first and last meals of the day and suppress the cough x 3 days with hycodan then return here by the end of the week to regroup  GERD  Diet  CTa > neg     06/23/2018 acute extended ov/Shanayah Kaffenberger re: cough since early Nov 2019  Chief Complaint  Patient presents with  . Acute Visit    pt went to [facility] ED after 1/6 ov, no DVT/PE found in workup.  pt still has sob with anyexertion or around heat.    cough to point of gagging. Sob room to room  Better  p cough med but taking very little  Cp just with coughing fits/ generalized anterior rec  Take a tsp or two of cough mediccine  every 4 hours until no cough at all x 3 days then stop it Protonix 40 mg Take 30- 60 min before your first and last meals of the day  Gabapentin 100 mg three times a day=  bfast lunch supper  GERD diet  No work until Monday 06/26/18  Please schedule a follow up office visit in 2  weeks, sooner if needed  with all medications /inhalers/ solutions in hand so we can verify exactly what you are taking. This includes all medications from all doctors and over the counters Add:  Echo for  [place] > neg    07/08/2018  f/u ov/Kaetlyn Noa re: cough > sob   Chief Complaint  Patient presents with  . Follow-up    Cough and SOB are unchanged. She is not producing any sputum.    Dyspnea:  Stops due to cough Cough: worse with hot air, exercise Sleeping: cough every night esp when first lie down  SABA use:  didn't help 02:  None  Body aches much better, still some cp with severe coughing fits, generalized   No obvious day to day or daytime variability or assoc excess/ purulent sputum or mucus plugs or hemoptysis or  chest tightness, subjective wheeze or overt sinus or hb symptoms.     Also denies any obvious fluctuation of symptoms with weather or environmental changes or other aggravating or alleviating factors except as outlined above   No unusual exposure hx or h/o childhood pna/ asthma or knowledge of premature birth.  Current Allergies, Complete Past Medical History, Past Surgical History, Family History, and Social History were reviewed in [employer] record.  ROS  The following are not active complaints unless bolded Hoarseness, sore throat, dysphagia, dental problems, itching, sneezing,  nasal congestion or discharge of excess mucus or purulent secretions, ear ache,   fever, chills, sweats, unintended wt loss or wt gain, classically pleuritic or exertional  cp,  orthopnea pnd or arm/hand swelling  or leg swelling, presyncope, palpitations, abdominal pain, anorexia, nausea, vomiting, diarrhea  or change in bowel habits or change in bladder habits, change in stools or change in urine=polyuria, dysuria, hematuria,  rash, arthralgias, visual complaints, headache, numbness, weakness or ataxia or problems with walking or coordination,  change in mood or  memory.        Current Meds  Medication Sig  . FLUoxetine (PROZAC) 20 MG tablet Take 20 mg by mouth every morning.  . gabapentin (NEURONTIN) 100 MG capsule Take 1 capsule (100 mg total) by mouth 3 (three) times daily. One three times daily  . lamoTRIgine (LAMICTAL) 100 MG tablet Take 200 mg by mouth 2 (two) times daily.  . Norgestimate-Ethinyl Estradiol Triphasic 0.18/0.215/0.25 MG-25 MCG tab Take 1 tablet by mouth daily.  . pantoprazole (PROTONIX) 40 MG tablet Take 1 tablet (40 mg total) by mouth daily. Take 30-60 min before first meal of the day  . topiramate (TOPAMAX) 50 MG tablet Take 1 tablet by mouth 2 (two) times daily.  . traZODone (DESYREL) 50 MG tablet Take 1 tablet by mouth at bedtime.                   Objective:    amb wf no spont coughing   07/08/2018       231   06/23/18 230 lb 12.8 oz (104.7 kg)  06/20/18 230 lb (104.3 kg)  08/18/16 227 lb 2 oz (103 kg)    Vital signs reviewed - Note on arrival 02 sats  97% on RA    HEENT: nl dentition, turbinates bilaterally, and oropharynx. Nl external ear canals without cough reflex   NECK :  without JVD/Nodes/TM/ nl carotid upstrokes bilaterally   LUNGS: no acc muscle use,  Nl contour chest which is clear to A and P bilaterally without cough on insp or exp maneuvers   CV:  RRR  no s3 or murmur or increase in P2, and no edema   ABD:  Obese butsoft and nontender with nl inspiratory excursion in the supine position. No bruits or organomegaly appreciated, bowel sounds nl  MS:  Nl gait/ ext warm without deformities, calf tenderness,  cyanosis or clubbing No obvious joint restrictions   SKIN: warm and dry without lesions    NEURO:  alert, anxious, nl sensorium with  no motor or cerebellar deficits apparent.                        Labs ordered  07/08/2018  U/a neg x for very low SG        Assessment

## 2018-07-10 ENCOUNTER — Encounter: Payer: Self-pay | Admitting: Internal Medicine

## 2018-07-10 DIAGNOSIS — R911 Solitary pulmonary nodule: Secondary | ICD-10-CM | POA: Insufficient documentation

## 2018-07-10 NOTE — Assessment & Plan Note (Signed)
CT 06/21/18 An incidental finding of potential clinical significance has been found. 6 mm left lower lobe nodule > 6 month f/u placed in reminder file   CT results reviewed with pt >>> Too small for PET or bx, not suspicious enough for excisional bx > really only option for now is follow the Fleischner society guidelines as rec by radiology.   Discussed in detail all the  indications, usual  risks and alternatives  relative to the benefits with patient who agrees to proceed with conservative f/u as outlined

## 2018-07-10 NOTE — Assessment & Plan Note (Signed)
Onset early nov 2019 neg resp to rx for bronchitis/asthma - Allergy profile 06/23/2018 >  Eos 0.3 /  IgE 11 RAST neg  - cyclical cough rx 12/16/9353  - gabapentin 100 tid 06/23/2018 > titrate up to 300 tid starting 07/08/2018   Upper airway cough syndrome (previously labeled PNDS),  is so named because it's frequently impossible to sort out how much is  CR/sinusitis with freq throat clearing (which can be related to primary GERD)   vs  causing  secondary (" extra esophageal")  GERD from wide swings in gastric pressure that occur with throat clearing, often  promoting self use of mint and menthol lozenges that reduce the lower esophageal sphincter tone and exacerbate the problem further in a cyclical fashion.   These are the same pts (now being labeled as having "irritable larynx syndrome" by some cough centers) who not infrequently have a history of having failed to tolerate ace inhibitors,  dry powder inhalers or biphosphonates or report having atypical/extraesophageal reflux symptoms that don't respond to standard doses of PPI  and are easily confused as having aecopd or asthma flares by even experienced allergists/ pulmonologists (myself included).    >>> priority here is to eliminate cough so will work on this first with gabapentin and 1st gen H1 blockers per guidelines

## 2018-07-10 NOTE — Assessment & Plan Note (Addendum)
Onset early Nov 2019 Chest CTa [facility] 04/18/18  Motion degradation limited detecting peripheral pe/  3 mm nodules  06/20/2018   Walked RA x one lap = 250 ft - stopped due to  Desat to 87% > CTa 06/21/18 neg pe/ ild but Mild PA enlargement noted  06/21/18 1. No CT evidence for acute pulmonary embolism. 2. No other acute cardiopulmonary abnormality identified. - 06/23/2018   Walked RA  2 laps @ 265ft each @ avg  pace  stopped due to sob no desat - Echo  07/04/2018   No PH  Low normal LV systolic function; probable mild diastolic   dysfunction; trace AI; mildly dilated ascending aorta; mild MR and TR. - 07/08/2018   Walked RA  3 laps @  approx 227ft each @ avg pace  stopped due to  Cough/ dizzy no desats during walk    Strongly suspect obesity/ deconditioning here but need to address the cough first then consider further w/u eg  CPST

## 2018-07-10 NOTE — Assessment & Plan Note (Signed)
U/A benign x for very dilute urine c/w over use of water  > suggest sugarless candy instead.    I had an extended discussion with the patient/huband reviewing all relevant studies completed to date and  lasting 15 to 20 minutes of a 25 minute visit  which included directly observing ambulatory 02 saturation study documented in a/p section of  today's  office note.  Each maintenance medication was reviewed in detail including most importantly the difference between maintenance and prns and under what circumstances the prns are to be triggered using an action plan format that is not reflected in the computer generated alphabetically organized AVS.     Please see AVS for specific instructions unique to this visit that I personally wrote and verbalized to the the pt in detail and then reviewed with pt  by my nurse highlighting any changes in therapy recommended at today's visit .

## 2018-07-11 ENCOUNTER — Other Ambulatory Visit: Payer: Self-pay | Admitting: Internal Medicine

## 2018-07-11 DIAGNOSIS — R053 Chronic cough: Secondary | ICD-10-CM

## 2018-07-11 DIAGNOSIS — N938 Other specified abnormal uterine and vaginal bleeding: Secondary | ICD-10-CM | POA: Diagnosis not present

## 2018-07-11 DIAGNOSIS — R102 Pelvic and perineal pain: Secondary | ICD-10-CM | POA: Insufficient documentation

## 2018-07-11 DIAGNOSIS — Z79899 Other long term (current) drug therapy: Secondary | ICD-10-CM | POA: Insufficient documentation

## 2018-07-11 DIAGNOSIS — R05 Cough: Secondary | ICD-10-CM

## 2018-07-11 DIAGNOSIS — R1030 Lower abdominal pain, unspecified: Secondary | ICD-10-CM | POA: Diagnosis present

## 2018-07-12 ENCOUNTER — Other Ambulatory Visit: Payer: Self-pay

## 2018-07-12 ENCOUNTER — Emergency Department (HOSPITAL_COMMUNITY)
Admission: EM | Admit: 2018-07-12 | Discharge: 2018-07-12 | Disposition: A | Discharge status: Home / Self Care | Payer: BLUE CROSS/BLUE SHIELD | Attending: Emergency Medicine | Admitting: Emergency Medicine

## 2018-07-12 ENCOUNTER — Encounter (HOSPITAL_COMMUNITY): Payer: Self-pay | Admitting: Emergency Medicine

## 2018-07-12 DIAGNOSIS — R102 Pelvic and perineal pain: Secondary | ICD-10-CM

## 2018-07-12 DIAGNOSIS — N938 Other specified abnormal uterine and vaginal bleeding: Secondary | ICD-10-CM

## 2018-07-12 LAB — COMPREHENSIVE METABOLIC PANEL
ALT: 9 U/L (ref 0–44)
AST: 10 U/L — AB (ref 15–41)
Albumin: 3.1 g/dL — ABNORMAL LOW (ref 3.5–5.0)
Alkaline Phosphatase: 63 U/L (ref 38–126)
Anion gap: 6 (ref 5–15)
BILIRUBIN TOTAL: 0.1 mg/dL — AB (ref 0.3–1.2)
BUN: 9 mg/dL (ref 6–20)
CO2: 23 mmol/L (ref 22–32)
Calcium: 8.1 mg/dL — ABNORMAL LOW (ref 8.9–10.3)
Chloride: 104 mmol/L (ref 98–111)
Creatinine, Ser: 0.78 mg/dL (ref 0.44–1.00)
GFR calc Af Amer: >60 mL/min (ref >60)
GFR calc non Af Amer: >60 mL/min (ref >60)
Glucose, Bld: 108 mg/dL — ABNORMAL HIGH (ref 70–99)
Potassium: 3.5 mmol/L (ref 3.5–5.1)
Sodium: 133 mmol/L — ABNORMAL LOW (ref 135–145)
Total Protein: 6.7 g/dL (ref 6.5–8.1)

## 2018-07-12 LAB — WET PREP, GENITAL
Clue Cells Wet Prep HPF POC: NONE SEEN
Sperm: NONE SEEN
Trich, Wet Prep: NONE SEEN
Yeast Wet Prep HPF POC: NONE SEEN

## 2018-07-12 LAB — CBC WITH DIFFERENTIAL/PLATELET
Abs Immature Granulocytes: 0.03 10*3/uL (ref 0.00–0.07)
Basophils Absolute: 0.1 10*3/uL (ref 0.0–0.1)
Basophils Relative: 1 %
EOS PCT: 2 %
Eosinophils Absolute: 0.2 10*3/uL (ref 0.0–0.5)
HCT: 34.9 % — ABNORMAL LOW (ref 36.0–46.0)
Hemoglobin: 10.8 g/dL — ABNORMAL LOW (ref 12.0–15.0)
IMMATURE GRANULOCYTES: 0 %
Lymphocytes Relative: 34 %
Lymphs Abs: 3.3 10*3/uL (ref 0.7–4.0)
MCH: 27.7 pg (ref 26.0–34.0)
MCHC: 30.9 g/dL (ref 30.0–36.0)
MCV: 89.5 fL (ref 80.0–100.0)
Monocytes Absolute: 0.8 10*3/uL (ref 0.1–1.0)
Monocytes Relative: 8 %
Neutro Abs: 5.3 10*3/uL (ref 1.7–7.7)
Neutrophils Relative %: 55 %
Platelets: 305 10*3/uL (ref 150–400)
RBC: 3.9 MIL/uL (ref 3.87–5.11)
RDW: 13.2 % (ref 11.5–15.5)
WBC: 9.6 10*3/uL (ref 4.0–10.5)
nRBC: 0 % (ref 0.0–0.2)

## 2018-07-12 LAB — I-STAT BETA HCG BLOOD, ED (MC, WL, AP ONLY): HCG, QUANTITATIVE: 5.2 m[IU]/mL — AB (ref <5)

## 2018-07-12 LAB — LIPASE, BLOOD: Lipase: 31 U/L (ref 11–51)

## 2018-07-12 MED ORDER — KETOROLAC TROMETHAMINE 60 MG/2ML IM SOLN
60.0000 mg | Freq: Once | INTRAMUSCULAR | Status: AC
  Administered 2018-07-12: 60 mg via INTRAMUSCULAR

## 2018-07-12 MED ORDER — KETOROLAC TROMETHAMINE 60 MG/2ML IM SOLN
INTRAMUSCULAR | Status: AC
  Administered 2018-07-12: 60 mg via INTRAMUSCULAR
  Filled 2018-07-12: qty 2

## 2018-07-12 NOTE — ED Triage Notes (Addendum)
Pt with sharp,stabbing abdominal pains that started around 2230. Pt states they start in lower abdomen and spread to upper abdomen. Pt states that earlier in day around 1100 she had a gush of bright red blood that was not painful and she is no longer bleeding.

## 2018-07-12 NOTE — Discharge Instructions (Addendum)
Take ibuprofen 600 mg 4 times a day OR Aleve 2 tablets twice a day for pain.  Please follow through with your referral to Dr. Mariana Arn to evaluate your abnormal Pap smear.  Please call to get scheduled for the outpatient ultrasound to see if you have fibroids.  Dr. Mariana Arn will be able to discuss that with you and plan on what to do for it.

## 2018-07-12 NOTE — ED Provider Notes (Signed)
[facility] EMERGENCY DEPARTMENT Provider Note   CSN: [id number] Arrival date & time: 07/11/18  2338  Time seen 2:40 AM   History   Chief Complaint Chief Complaint  Patient presents with  . Abdominal Pain    HPI [name] is a 38 y.o. female.  HPI patient states about 11 PM she started having lower abdominal pain that started out like contractions and is there constantly.  She states it waxes and wanes and when it gets bad it stabbing.  She states this morning she had some spotting and about 11 AM she was at work and she had acute gush of bleeding from her vagina that was running down her legs.  She states after 10 minutes she had has had no more bleeding today.  She states she has never had any irregularity of her periods in the past.  Her last normal period was 2 weeks ago.  She denies nausea, vomiting, diarrhea, fever, dysuria, or frequency.  She is G2 P2 (1 stillborn).  Patient is on birth control pills for birth control and states she does not miss her pills.  She states she does not have any prior history of pelvic problems.  PCP Helane Rima, MD     Past Medical History:  Diagnosis Date  . Abnormal Pap smear of cervix   . Acne fulminans   . Allergic rhinitis   . Brain cyst   . Breast nodule   . Cervical polyp   . Epilepsy ([place])   . Fibromyalgia   . GERD (gastroesophageal reflux disease)   . IBS (irritable bowel syndrome)   . Kidney stones   . Melanoma of hip ([place])   . Menorrhagia   . Migraine headache   . 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 Previous stillbirth or demise, antepartum   . Pulmonary nodule    previous evaluation - benign  . Seizures ([place])    recently diagnosed with epilepsy  . Sepsis(995.91)   . Shortness of breath    due to wt  . Small bowel obstruction ([place])   . Staphylococcal infection     Patient Active Problem List   Diagnosis Date Noted  . Solitary pulmonary nodule 07/10/2018  . Dysuria 07/08/2018  . Morbid obesity due to excess  calories ([place]) 06/25/2018  . DOE (dyspnea on exertion) 06/20/2018  . Chronic cough 06/20/2018  . PALPITATIONS 06/12/2010  . CHEST PAIN, PRECORDIAL 06/12/2010  . MIGRAINE HEADACHE 06/11/2010  . IBS 06/11/2010  . ALLERGIC RHINITIS 09/11/2009    Past Surgical History:  Procedure Laterality Date  . BREAST LUMPECTOMY  2008   right breast - benign; needle - localized excision of right breast mass x2  . caesarean section    . HYSTEROSCOPY W/D&C  04/27/2011   Procedure: DILATATION AND CURETTAGE (D&C) /HYSTEROSCOPY;  Surgeon: Thurnell Lose, MD;  Location: [facility];  Service: Gynecology;  Laterality: N/A;  . MELANOMA EXCISION    . svd     x 1 - stillborn at 65 weeks     OB History    Gravida  2   Para  2   Term  2   Preterm      AB      Living  1     SAB      TAB      Ectopic      Multiple      Live Births  1            Home  Medications    Prior to Admission medications   Medication Sig Start Date End Date Taking? Authorizing Provider  chlorpheniramine (CHLOR-TRIMETON) 4 MG tablet Take 4 mg by mouth at bedtime.   Yes [provider]  FLUoxetine (PROZAC) 20 MG tablet Take 20 mg by mouth every morning. 07/16/14  Yes [provider]  gabapentin (NEURONTIN) 100 MG capsule Take 1 capsule (100 mg total) by mouth 3 (three) times daily. One three times daily 06/23/18  Yes Tanda Rockers, MD  gabapentin (NEURONTIN) 300 MG capsule TAKE 1 CAPSULE BY MOUTH THREE TIMES A DAY 07/11/18  Yes Tanda Rockers, MD  lamoTRIgine (LAMICTAL) 100 MG tablet Take 200 mg by mouth 2 (two) times daily.   Yes [provider]  Norgestimate-Ethinyl Estradiol Triphasic 0.18/0.215/0.25 MG-25 MCG tab Take 1 tablet by mouth daily.   Yes [provider]  pantoprazole (PROTONIX) 40 MG tablet Take 1 tablet (40 mg total) by mouth daily. Take 30-60 min before first meal of the day 06/23/18  Yes Tanda Rockers, MD  topiramate (TOPAMAX) 50 MG tablet Take 1 tablet by mouth 2  (two) times daily. 03/07/18  Yes [provider]  traZODone (DESYREL) 50 MG tablet Take 1 tablet by mouth at bedtime. 05/31/17  Yes [provider]  HYDROcodone-homatropine (HYCODAN) 5-1.5 MG/5ML syrup Take 5 mLs by mouth every 6 (six) hours as needed for cough. Patient not taking: Reported on 07/08/2018 06/24/18   Tanda Rockers, MD    Family History Family History  Problem Relation Age of Onset  . Emphysema Paternal Grandmother   . Heart disease Paternal Grandmother   . Emphysema Paternal Grandfather   . Cancer Paternal Grandfather        throat cancer    Social History Social History   Tobacco Use  . Smoking status: Never Smoker  . Smokeless tobacco: Never Used  Substance Use Topics  . Alcohol use: No  . Drug use: No     Allergies   Amitriptyline; Cymbalta [duloxetine hcl]; and Sulfonamide derivatives   Review of Systems Review of Systems  All other systems reviewed and are negative.    Physical Exam Updated Vital Signs BP 109/76 (BP Location: Right Arm)   Pulse 79   Temp 98 F (36.7 C) (Oral)   Resp 16   Ht 5\' 2"  (1.575 m)   Wt 104.3 kg   LMP 06/21/2018   SpO2 98%   BMI 42.07 kg/m   Vital signs normal    Physical Exam Vitals signs and nursing note reviewed. Exam conducted with a chaperone present.  Constitutional:      General: She is not in acute distress.    Appearance: Normal appearance. She is well-developed. She is not ill-appearing or toxic-appearing.  HENT:     Head: Normocephalic and atraumatic.     Right Ear: External ear normal.     Left Ear: External ear normal.     Nose: Nose normal. No mucosal edema or rhinorrhea.     Mouth/Throat:     Mouth: Mucous membranes are moist.     Dentition: No dental abscesses.     Pharynx: No uvula swelling.  Eyes:     Conjunctiva/sclera: Conjunctivae normal.     Pupils: Pupils are equal, round, and reactive to light.  Neck:     Musculoskeletal: Full passive range of motion without  pain, normal range of motion and neck supple.  Cardiovascular:     Rate and Rhythm: Normal rate and regular rhythm.  Heart sounds: Normal heart sounds. No murmur. No friction rub. No gallop.   Pulmonary:     Effort: Pulmonary effort is normal. No respiratory distress.     Breath sounds: Normal breath sounds. No wheezing, rhonchi or rales.  Chest:     Chest wall: No tenderness or crepitus.  Abdominal:     General: Bowel sounds are normal. There is no distension.     Palpations: Abdomen is soft.     Tenderness: There is abdominal tenderness in the right lower quadrant, suprapubic area and left lower quadrant. There is no guarding or rebound.  Genitourinary:    Comments: There is no blood seen on observation of her perineum.  When I insert the speculum there is a lot of chocolate brown blood in the vault and small area of bright red blood around the cervix.  When I palpate her uterus it appears enlarged and firm consistent with fibroids and has some tenderness.  Her adnexa are nontender and not enlarged Musculoskeletal: Normal range of motion.        General: No deformity.     Comments: Moves all extremities well.   Skin:    General: Skin is warm and dry.     Coloration: Skin is not pale.     Findings: No erythema or rash.  Neurological:     General: No focal deficit present.     Mental Status: She is alert and oriented to person, place, and time.     Cranial Nerves: No cranial nerve deficit.  Psychiatric:        Mood and Affect: Mood normal. Mood is not anxious.        Speech: Speech normal.        Behavior: Behavior normal.        Thought Content: Thought content normal.      ED Treatments / Results  Labs (all labs ordered are listed, but only abnormal results are displayed) Results for orders placed or performed during the hospital encounter of 07/12/18  Wet prep, genital  Result Value Ref Range   Yeast Wet Prep HPF POC NONE SEEN NONE SEEN   Trich, Wet Prep NONE SEEN NONE  SEEN   Clue Cells Wet Prep HPF POC NONE SEEN NONE SEEN   WBC, Wet Prep HPF POC MODERATE (A) NONE SEEN   Sperm NONE SEEN   Comprehensive metabolic panel  Result Value Ref Range   Sodium 133 (L) 135 - 145 mmol/L   Potassium 3.5 3.5 - 5.1 mmol/L   Chloride 104 98 - 111 mmol/L   CO2 23 22 - 32 mmol/L   Glucose, Bld 108 (H) 70 - 99 mg/dL   BUN 9 6 - 20 mg/dL   Creatinine, Ser 0.78 0.44 - 1.00 mg/dL   Calcium 8.1 (L) 8.9 - 10.3 mg/dL   Total Protein 6.7 6.5 - 8.1 g/dL   Albumin 3.1 (L) 3.5 - 5.0 g/dL   AST 10 (L) 15 - 41 U/L   ALT 9 0 - 44 U/L   Alkaline Phosphatase 63 38 - 126 U/L   Total Bilirubin 0.1 (L) 0.3 - 1.2 mg/dL   GFR calc non Af Amer >60 >60 mL/min   GFR calc Af Amer >60 >60 mL/min   Anion gap 6 5 - 15  Lipase, blood  Result Value Ref Range   Lipase 31 11 - 51 U/L  CBC with Differential  Result Value Ref Range   WBC 9.6 4.0 - 10.5 K/uL   RBC  3.90 3.87 - 5.11 MIL/uL   Hemoglobin 10.8 (L) 12.0 - 15.0 g/dL   HCT 34.9 (L) 36.0 - 46.0 %   MCV 89.5 80.0 - 100.0 fL   MCH 27.7 26.0 - 34.0 pg   MCHC 30.9 30.0 - 36.0 g/dL   RDW 13.2 11.5 - 15.5 %   Platelets 305 150 - 400 K/uL   nRBC 0.0 0.0 - 0.2 %   Neutrophils Relative % 55 %   Neutro Abs 5.3 1.7 - 7.7 K/uL   Lymphocytes Relative 34 %   Lymphs Abs 3.3 0.7 - 4.0 K/uL   Monocytes Relative 8 %   Monocytes Absolute 0.8 0.1 - 1.0 K/uL   Eosinophils Relative 2 %   Eosinophils Absolute 0.2 0.0 - 0.5 K/uL   Basophils Relative 1 %   Basophils Absolute 0.1 0.0 - 0.1 K/uL   Immature Granulocytes 0 %   Abs Immature Granulocytes 0.03 0.00 - 0.07 K/uL  I-Stat beta hCG blood, ED  Result Value Ref Range   I-stat hCG, quantitative 5.2 (H) <5 mIU/mL   Comment 3           Laboratory interpretation all normal except mild anemia that is stable    EKG None  Radiology No results found.  Procedures Procedures (including critical care time)  Medications Ordered in ED Medications  ketorolac (TORADOL) injection 60 mg (60 mg  Intramuscular Given 07/12/18 0324)     Initial Impression / Assessment and Plan / ED Course  I have reviewed the triage vital signs and the nursing notes.  Pertinent labs & imaging results that were available during my care of the patient were reviewed by me and considered in my medical decision making (see chart for details).     Patient was given Toradol IM for her pain after my exam.  I suspect a lot of her problems she is having today is related to her fibroids by exam.  When I asked the patient she has a gynecologist she initially told me know but then states her doctor has referred her to Dr. speaks.  She does admit to having heavy periods although she denied that before.  She states she is referred to because she had dysplasia on her last Pap smear.  I told her that this could be followed up with Dr. speaks.  We also discussed coming back for a outpatient ultrasound later today.  Final Clinical Impressions(s) / ED Diagnoses   Final diagnoses:  Pelvic pain  DUB (dysfunctional uterine bleeding)    ED Discharge Orders         Ordered    US Transvaginal Non-OB (US Pelvis)     07/12/18 0423    US PELVIS (TRANSABDOMINAL ONLY)     07/12/18 0423          Plan discharge  Rolland Porter, MD, Barbette Or, MD 07/12/18 [phone]

## 2018-07-15 ENCOUNTER — Other Ambulatory Visit: Payer: Self-pay | Admitting: Internal Medicine

## 2018-07-15 DIAGNOSIS — R05 Cough: Secondary | ICD-10-CM

## 2018-07-15 DIAGNOSIS — R053 Chronic cough: Secondary | ICD-10-CM

## 2018-08-05 ENCOUNTER — Ambulatory Visit (INDEPENDENT_AMBULATORY_CARE_PROVIDER_SITE_OTHER): Payer: BLUE CROSS/BLUE SHIELD | Admitting: Internal Medicine

## 2018-08-05 ENCOUNTER — Encounter: Payer: Self-pay | Admitting: Internal Medicine

## 2018-08-05 ENCOUNTER — Encounter: Payer: Self-pay | Admitting: *Deleted

## 2018-08-05 VITALS — BP 104/68 | HR 98 | Ht 62.0 in | Wt 231.0 lb

## 2018-08-05 DIAGNOSIS — R05 Cough: Secondary | ICD-10-CM

## 2018-08-05 DIAGNOSIS — R0600 Dyspnea, unspecified: Secondary | ICD-10-CM

## 2018-08-05 DIAGNOSIS — R053 Chronic cough: Secondary | ICD-10-CM

## 2018-08-05 DIAGNOSIS — R0609 Other forms of dyspnea: Secondary | ICD-10-CM

## 2018-08-05 MED ORDER — GABAPENTIN 300 MG PO CAPS: 300.0000 mg | ORAL_CAPSULE | Freq: Three times a day (TID) | ORAL | 2 refills | Status: DC

## 2018-08-05 NOTE — Progress Notes (Signed)
[name], female    DOB: 11/26/1980,    MRN: [id number]    Brief patient profile:  56 yowf never smoker healthy child  last Term IUP  2003 with baseline wt of 160 then AT wt = 175 doing great then July 2019 got hit in head at work with concussion / post concussion HA seen in ER not working out since then acutely worse 1st week Nov 2019 with wet cough and chest discomfort with cough to point of gagging  and > Nov 4th UC > rx pred / inhaler/tessalon > ER at [facility]  04/18/18  CTa neg pe but c/w bronchitis > continued rx with prednisone / saba  But much worse x last week of dec 2019 resting sob and first L then R pleuritic cp with traces of hemoptysis so referred to pulmonary clinic 06/20/2018 by Dr   [name]    History of Present Illness  06/20/2018  Pulmonary/ 1st office eval/[name]  Chief Complaint  Patient presents with  . Pulmonary Consult    Referred by [name] Pates, NP. Pt c/o SOB and chest discomfort x 2 months. She states she gets winded walking short distances such as lobby to exam room today. She is SOB when she lies down and has been having to sleep propped up. She also c/o cough- mainly non prod but has produces minimal bright red blood before- last episode a few days ago.    Dyspnea:  At rest, worse room to room  Cough: ok at rest / worse when lie back < 60 degrees  Sleep: no sleeping due to cough > sob  SABA use: none Cp started on L now on R and generalized  with coughing fits  rec  start prilosec 20 mg Take 30- 60 min before your first and last meals of the day and suppress the cough x 3 days with hycodan then return here by the end of the week to regroup  GERD  Diet  CTa > neg     06/23/2018 acute extended ov/[name] re: cough since early Nov 2019  Chief Complaint  Patient presents with  . Acute Visit    pt went to [facility] ED after 1/6 ov, no DVT/PE found in workup.  pt still has sob with anyexertion or around heat.    cough to point of gagging. Sob room to room  Better p cough  med but taking very little  Cp just with coughing fits/ generalized anterior rec  Take a tsp or two of cough mediccine  every 4 hours until no cough at all x 3 days then stop it Protonix 40 mg Take 30- 60 min before your first and last meals of the day  Gabapentin 100 mg three times a day=  bfast lunch supper  GERD diet  No work until Monday 06/26/18  Please schedule a follow up office visit in 2  weeks, sooner if needed  with all medications /inhalers/ solutions in hand so we can verify exactly what you are taking. This includes all medications from all doctors and over the counters Add:  Echo for  [place] > neg    07/08/2018  f/u ov/[name] re: cough > sob   Chief Complaint  Patient presents with  . Follow-up    Cough and SOB are unchanged. She is not producing any sputum.   Dyspnea:  Stops due to cough Cough: worse with hot air, exercise Sleeping: cough every night esp when first lie down  SABA use: didn't  help 02:  None  Body aches much better, still some cp with severe coughing fits, generalized  rec Gabapentin 100mg  take 2 three times a day until the gabapentin is used up then new prescription for 300 mg three times a day  Stay on protonix Take 30- 60 min before your first and last meals of the day  For drainage / throat tickle try take CHLORPHENIRAMINE  4 mg(chlortabs at [facility]) - take one every 4 hours as needed -  GERD rx  Please remember to go to the lab department   for your tests - we will call you with the results when they are available. Please schedule a follow up office visit in 4 weeks, sooner if needed  with all medications /inhalers/ solutions in hand so we can verify exactly what you are taking. This includes all medications from all doctors and over the counters    08/05/2018  f/u ov/[name] re: cough Nov 2019  Improved on gabapentin 200 tid  Chief Complaint  Patient presents with  . Follow-up    Cough is some better but has not resolved. Breathing has improved.     Dyspnea: ok unless coughing  Cough: dry/ worse with ex  Sleeping: some better p one h1 at hs never tried two as rec  SABA use: none 02: none    No obvious day to day or daytime variability or assoc excess/ purulent sputum or mucus plugs or hemoptysis or cp or chest tightness, subjective wheeze or overt sinus or hb symptoms.   Sleeping as above  without nocturnal  or early am exacerbation  of respiratory  c/o's or need for noct saba. Also denies any obvious fluctuation of symptoms with weather or environmental changes or other aggravating or alleviating factors except as outlined above   No unusual exposure hx or h/o childhood pna/ asthma or knowledge of premature birth.  Current Allergies, Complete Past Medical History, Past Surgical History, Family History, and Social History were reviewed in [employer] record.  ROS  The following are not active complaints unless bolded Hoarseness, sore throat, dysphagia, dental problems, itching, sneezing,  nasal congestion or discharge of excess mucus or purulent secretions, ear ache,   fever, chills, sweats, unintended wt loss or wt gain, classically pleuritic or exertional cp,  orthopnea pnd or arm/hand swelling  or leg swelling, presyncope, palpitations, abdominal pain, anorexia, nausea, vomiting, diarrhea  or change in bowel habits or change in bladder habits, change in stools or change in urine, dysuria, hematuria,  rash, arthralgias, visual complaints, headache, numbness, weakness or ataxia or problems with walking or coordination,  change in mood or  memory.        Current Meds  Medication Sig  . chlorpheniramine (CHLOR-TRIMETON) 4 MG tablet Take 4 mg by mouth at bedtime.  Marland Kitchen FLUoxetine (PROZAC) 20 MG tablet Take 20 mg by mouth every morning.  . gabapentin (NEURONTIN) 100 MG capsule Take 1 capsule (100 mg total) by mouth 3 (three) times daily. One three times daily (Patient taking differently: Take 200 mg by mouth 3 (three)  times daily. One three times daily)  . HYDROcodone-homatropine (HYCODAN) 5-1.5 MG/5ML syrup Take 5 mLs by mouth every 6 (six) hours as needed for cough.  . lamoTRIgine (LAMICTAL) 100 MG tablet Take 200 mg by mouth 2 (two) times daily.  . Norgestimate-Ethinyl Estradiol Triphasic 0.18/0.215/0.25 MG-25 MCG tab Take 1 tablet by mouth daily.  . pantoprazole (PROTONIX) 40 MG tablet Take 1 tablet (40 mg total) by  mouth daily. Take 30-60 min before first meal of the day  . topiramate (TOPAMAX) 50 MG tablet Take 1 tablet by mouth daily.   . traZODone (DESYREL) 50 MG tablet Take 1 tablet by mouth at bedtime.                     Objective:    Somber amb obese wf nad    08/05/2018        231  07/08/2018       231   06/23/18 230 lb 12.8 oz (104.7 kg)  06/20/18 230 lb (104.3 kg)  08/18/16 227 lb 2 oz (103 kg)    Vital signs reviewed - Note on arrival 02 sats  99% on RA      HEENT: nl dentition, turbinates bilaterally, and oropharynx. Nl external ear canals without cough reflex   NECK :  without JVD/Nodes/TM/ nl carotid upstrokes bilaterally   LUNGS: no acc muscle use,  Nl contour chest which is clear to A and P bilaterally without cough on insp or exp maneuvers   CV:  RRR  no s3 or murmur or increase in P2, and no edema   ABD:  soft and nontender with nl inspiratory excursion in the supine position. No bruits or organomegaly appreciated, bowel sounds nl  MS:  Nl gait/ ext warm without deformities, calf tenderness, cyanosis or clubbing No obvious joint restrictions   SKIN: warm and dry without lesions    NEURO:  alert, approp, nl sensorium with  no motor or cerebellar deficits apparent.                       Assessment

## 2018-08-05 NOTE — Patient Instructions (Addendum)
Gabapentin 300 mg one three times a day (bfast - lunch - supper ) after a week if not better add another at bedtime  Increase chlorpheniramine to 4 mg x 2 at bedtime   Please schedule a follow up office visit in 6 weeks, call sooner if needed  - late add: the protonix should be Take 30- 60 min before your first and last meals of the day as instructed at [facility] 07/08/18

## 2018-08-07 ENCOUNTER — Encounter: Payer: Self-pay | Admitting: Internal Medicine

## 2018-08-07 MED ORDER — PANTOPRAZOLE SODIUM 40 MG PO TBEC: DELAYED_RELEASE_TABLET | ORAL | 2 refills | Status: DC

## 2018-08-07 NOTE — Assessment & Plan Note (Signed)
Onset early nov 2019 neg resp to rx for bronchitis/asthma - Allergy profile 06/23/2018 >  Eos 0.3 /  IgE 11 RAST neg  - cyclical cough rx 06/18/9700  - gabapentin 100 tid 06/23/2018 > titrate up to 200 tid starting 07/08/2018  - 08/05/2018 improved so rec gabapentin 300 tid ,  h1 x 2 @ hs   Finally turing the corner with rx directed at  Upper airway cough syndrome (previously labeled PNDS),  is so named because it's frequently impossible to sort out how much is  CR/sinusitis with freq throat clearing (which can be related to primary GERD)   vs  causing  secondary (" extra esophageal")  GERD from wide swings in gastric pressure that occur with throat clearing, often  promoting self use of mint and menthol lozenges that reduce the lower esophageal sphincter tone and exacerbate the problem further in a cyclical fashion.   These are the same pts (now being labeled as having "irritable larynx syndrome" by some cough centers) who not infrequently have a history of having failed to tolerate ace inhibitors,  dry powder inhalers or biphosphonates or report having atypical/extraesophageal reflux symptoms that don't respond to standard doses of PPI  and are easily confused as having aecopd or asthma flares by even experienced allergists/ pulmonologists (myself included).    Of the three most common causes of  Sub-acute / recurrent or chronic cough, only one (GERD, at which she is at risk due to obesity/ bcp's and cough)   can actually contribute to/ trigger  the other two (asthma and post nasal drip syndrome)  and perpetuate the cylce of cough.  While not intuitively obvious, many patients with chronic low grade reflux do not cough until there is a primary insult that disturbs the protective epithelial barrier and exposes sensitive nerve endings.   This is typically viral but can due to PNDS and  either may apply here.     >>>>The point is that once this occurs, it is difficult to eliminate the cycle  using anything but  a maximally effective acid suppression regimen at least in the short run, accompanied by an appropriate diet to address non acid GERD and control / eliminate the cough itself with gabapentin titrated as high as 300 mg qid if tol and if not refer to [facility]/ Dr Joya Gaskins    I had an extended discussion with the patient reviewing all relevant studies completed to date and  lasting 15 to 20 minutes of a 25 minute visit    Each maintenance medication was reviewed in detail including most importantly the difference between maintenance and prns and under what circumstances the prns are to be triggered using an action plan format that is not reflected in the computer generated alphabetically organized AVS.     Please see AVS for specific instructions unique to this visit that I personally wrote and verbalized to the the pt in detail and then reviewed with pt  by my nurse highlighting any  changes in therapy recommended at today's visit to their plan of care.

## 2018-08-07 NOTE — Assessment & Plan Note (Signed)
Onset early Nov 2019 Chest CTa [place] 04/18/18  Motion degradation limited detecting peripheral pe/  3 mm nodules  06/20/2018   Walked RA x one lap = 250 ft - stopped due to  Desat to 87% > CTa 06/21/18 neg pe/ ild but Mild PA enlargement noted  - 06/23/2018   Walked RA  2 laps @ 257ft each @ avg  pace  stopped due to sob no desat - Echo  07/04/2018   No PH  Low normal LV systolic function; probable mild diastolic   dysfunction; trace AI; mildly dilated ascending aorta; mild MR and TR. - 07/08/2018   Walked RA  3 laps @  approx 295ft each @ avg pace  stopped due to  Cough/ dizzy no desats during walk    Improved and only sob now when coughing so will address this and no further w/u  eg cpst until this is resolved and re-conditioning work attempted .

## 2018-08-08 ENCOUNTER — Telehealth: Payer: Self-pay | Admitting: *Deleted

## 2018-08-08 NOTE — Telephone Encounter (Signed)
Spoke with the pt and notified of recs per MW  She verbalized understanding 

## 2018-08-08 NOTE — Telephone Encounter (Signed)
-----   Message from Tanda Rockers, MD sent at 08/07/2018  6:49 AM EST ----- Should follow instructions from previous ov =  protonix is 40 mg Take 30- 60 min before your first and last meals of the day and I called in new rx for her - this is just until next ov and if better will simplify all her treatments then

## 2018-08-30 ENCOUNTER — Telehealth: Payer: Self-pay | Admitting: Internal Medicine

## 2018-08-30 MED ORDER — DOXYCYCLINE HYCLATE 100 MG PO TABS: 100.0000 mg | ORAL_TABLET | Freq: Two times a day (BID) | ORAL | 0 refills | Status: DC

## 2018-08-30 NOTE — Telephone Encounter (Signed)
Called and spoke with patient, she is not currently pregnant, she has the Mirena IUD in place. No recent intercourse in the last 3 months due to having the IUD placed. Please advise, thank you.

## 2018-08-30 NOTE — Telephone Encounter (Signed)
Spoke with patient. She is aware that doxy has been called in for her. Verbalized understanding about directions. Nothing further needed at time of call.

## 2018-08-30 NOTE — Telephone Encounter (Signed)
Primary Pulmonologist: MW Last office visit and with whom: 08/07/18 with MW  What do we see them for (pulmonary problems): allergic rhinitis Last OV assessment/plan: Gabapentin 300 mg one three times a day (bfast - lunch - supper ) after a week if not better add another at bedtime  Increase chlorpheniramine to 4 mg x 2 at bedtime   Please schedule a follow up office visit in 6 weeks, call sooner if needed  - late add: the protonix should be Take 30- 60 min before your first and last meals of the day as instructed at ov 07/08/18    Reason for call: Patient stated that she has been sick for the past week. She went to urgent care on Thursday 08/25/18. She was told that she had a virus and was prescribed a zpak and codeine cough syrup. She is finished with the abx. She still has the cough. It is productive with a greenish color. Denies any fever, chills but does seem to get SOB easily. She has not travelled recently but she works at [employer] so she has been around a lot of people.   Pharmacy is [facility] in [place].   Kenney Houseman, please advise MW is not in clinic today. Thanks!

## 2018-08-30 NOTE — Telephone Encounter (Signed)
Please ask if patient is pregnant. I tried to send in a prescription, but it gave me a pregnancy warning.

## 2018-08-30 NOTE — Telephone Encounter (Signed)
I have sent in an antibiotic. She can also take mucinex. If fever develops please call office.

## 2018-09-12 ENCOUNTER — Other Ambulatory Visit: Payer: Self-pay | Admitting: Internal Medicine

## 2018-09-12 DIAGNOSIS — R053 Chronic cough: Secondary | ICD-10-CM

## 2018-09-12 DIAGNOSIS — R05 Cough: Secondary | ICD-10-CM

## 2018-09-13 ENCOUNTER — Telehealth: Payer: Self-pay | Admitting: Internal Medicine

## 2018-09-13 NOTE — Telephone Encounter (Signed)
Will route to Lauren.

## 2018-09-14 ENCOUNTER — Ambulatory Visit (INDEPENDENT_AMBULATORY_CARE_PROVIDER_SITE_OTHER): Payer: BLUE CROSS/BLUE SHIELD | Admitting: Internal Medicine

## 2018-09-14 ENCOUNTER — Encounter: Payer: Self-pay | Admitting: *Deleted

## 2018-09-14 ENCOUNTER — Telehealth: Payer: Self-pay | Admitting: *Deleted

## 2018-09-14 ENCOUNTER — Encounter: Payer: Self-pay | Admitting: Internal Medicine

## 2018-09-14 ENCOUNTER — Other Ambulatory Visit: Payer: Self-pay

## 2018-09-14 DIAGNOSIS — R05 Cough: Secondary | ICD-10-CM | POA: Diagnosis not present

## 2018-09-14 DIAGNOSIS — R053 Chronic cough: Secondary | ICD-10-CM

## 2018-09-14 MED ORDER — ACETAMINOPHEN-CODEINE #3 300-30 MG PO TABS: 1.0000 | ORAL_TABLET | ORAL | 0 refills | Status: AC | PRN

## 2018-09-14 MED ORDER — PANTOPRAZOLE SODIUM 40 MG PO TBEC: DELAYED_RELEASE_TABLET | ORAL | 1 refills | Status: DC

## 2018-09-14 MED ORDER — GABAPENTIN 300 MG PO CAPS: 300.0000 mg | ORAL_CAPSULE | Freq: Four times a day (QID) | ORAL | 2 refills | Status: DC

## 2018-09-14 MED ORDER — PREDNISONE 10 MG PO TABS: 10.0000 mg | ORAL_TABLET | Freq: Every day | ORAL | 0 refills | Status: DC

## 2018-09-14 NOTE — Assessment & Plan Note (Addendum)
Onset early nov 2019 neg resp to rx for bronchitis/asthma - Allergy profile 06/23/2018 >  Eos 0.3 /  IgE 11 RAST neg  - cyclical cough rx 11/21/319 > did not completely follow instructions - gabapentin 100 tid 06/23/2018 > titrate up to 200 tid starting 07/08/2018  - 08/05/2018 improved so rec gabapentin 300 tid ,  h1 x 2 @ hs > 50% improved  - 07/18/4823 repeat cyclical cough protocol with tyl #3 / increased gabapentin to 300 mg qid    Of the three most common causes of  Sub-acute / recurrent or chronic cough, only one (GERD)  can actually contribute to/ trigger  the other two (asthma and post nasal drip syndrome)  and perpetuate the cylce of cough.  While not intuitively obvious, many patients with chronic low grade reflux do not cough until there is a primary insult that disturbs the protective epithelial barrier and exposes sensitive nerve endings.   This is typically viral but can due to PNDS and  either may apply here.      >>> The point is that once this occurs, it is difficult to eliminate the cycle  using anything but a maximally effective acid suppression regimen at least in the short run, accompanied by an appropriate diet to address non acid GERD and control / eliminate the cough itself for at least 4 days with tyl #3 short term and gabapentin 300 qid longterm if tol along with 1st gen H1 blockers per guidelines  At hs and prn daytime for pnds with pred x 6 days in case any element of Th2 driven inflammation contributing to cough   >>> If not able to eliminate the cough completely using above "tools"  >>> return with all meds in hand using a trust but verify approach to confirm accurate Medication  Reconciliation The principal here is that until we are certain that the  patients are doing what we've asked, it makes no sense to ask them to do more.    >>> Sinus ct should be considered to complete the w/u vs refer to Bettina Gavia at [facility] next    Each maintenance medication was reviewed in  detail including most importantly the difference between maintenance and as needed and under what circumstances the prns are to be used.  Please see AVS for specific  Instructions which are unique to this visit and I personally typed out  which were reviewed in detail in writing with the patient and a copy provided.

## 2018-09-14 NOTE — Patient Instructions (Addendum)
Protonix 40 mg should be Take 30- 60 min before your first and last meals of the day   Chlorpheniramine  is 4 mg x 2 x one hour at bedtime   Gabapentin 300 mg four times daily   Take delsym two tsp every 12 hours and supplement if needed with  Tylenol #3   up to 1-2 every 4 hours to suppress even the urge to cough. Swallowing water and/or using ice chips/non mint and menthol containing candies (such as lifesavers or sugarless jolly ranchers) are also effective.  You should rest your voice and avoid activities that you know make you cough.  Once you have eliminated the cough for 3 straight days try reducing the Tylenol #3 first,  then the delsym as tolerated.      We will fax instrutions and work note to return April 6th due to severe cough  Fax  [phone]  If not better call April 6th for appt but bring all your medications with you  Add:  If not tried prednisone recently rec Prednisone 10 mg take  4 each am x 2 days,   2 each am x 2 days,  1 each am x 2 days and stop  Add:  Sinus ct should be considered to complete the w/u vs refer to Bettina Gavia at [place] next

## 2018-09-14 NOTE — Telephone Encounter (Signed)
-----   Message from Tanda Rockers, MD sent at 09/14/2018 11:43 AM EDT ----- Forgot to tell her the best way to eliminate the cough is combine 6 days of prednisone with 4 days of no coughing so should also start  today: Prednisone 10 mg take  4 each am x 2 days,   2 each am x 2 days,  1 each am x 2 days and stop

## 2018-09-14 NOTE — Progress Notes (Signed)
[name], female    DOB: 1981-05-15,    MRN: [id number]    Brief patient profile:  2 yowf never smoker healthy child  last Term IUP  2003 with baseline wt of 160 then @ wt = 175 doing great then July 2019 got hit in head at work with concussion / post concussion HA seen in ER not working out since then with progressive wt gain acutely worse 1st week Nov 2019 with wet cough and chest discomfort with cough to point of gagging  and > Nov 4th UC > rx pred / inhaler/tessalon > ER at [facility]  04/18/18  CTa neg pe but c/w bronchitis > continued rx with prednisone / saba  But much worse x last week of dec 2019 resting sob and first L then R pleuritic cp with traces of hemoptysis so referred to pulmonary clinic 06/20/2018 by Dr   Sallyanne Havers with neg CTa chest 06/21/2018 and  Working dx of UACS.       History of Present Illness  06/20/2018  Pulmonary/ 1st office eval/  Chief Complaint  Patient presents with  . Pulmonary Consult    Referred by Dietrich Pates, NP. Pt c/o SOB and chest discomfort x 2 months. She states she gets winded walking short distances such as lobby to exam room today. She is SOB when she lies down and has been having to sleep propped up. She also c/o cough- mainly non prod but has produces minimal bright red blood before- last episode a few days ago.    Dyspnea:  At rest, worse room to room  Cough: ok at rest / worse when lie back < 60 degrees  Sleep: no sleeping due to cough > sob  SABA use: none Cp started on L now on R and generalized  with coughing fits  rec  start prilosec 20 mg Take 30- 60 min before your first and last meals of the day and suppress the cough x 3 days with hycodan then return here by the end of the week to regroup  GERD  Diet  CTa > neg     06/23/2018 acute extended ov/ re: cough since early Nov 2019  Chief Complaint  Patient presents with  . Acute Visit    pt went to [facility] ED after 1/6 ov, no DVT/PE found in workup.  pt still has sob with  anyexertion or around heat.    cough to point of gagging. Sob room to room  Better p cough med but taking very little  Cp just with coughing fits/ generalized anterior rec  Take a tsp or two of cough mediccine  every 4 hours until no cough at all x 3 days then stop it Protonix 40 mg Take 30- 60 min before your first and last meals of the day  Gabapentin 100 mg three times a day=  bfast lunch supper  GERD diet  No work until Monday 06/26/18  Please schedule a follow up office visit in 2  weeks, sooner if needed  with all medications /inhalers/ solutions in hand so we can verify exactly what you are taking. This includes all medications from all doctors and over the counters Add:  Echo for  [place] > neg    07/08/2018  f/u ov/ re: cough > sob   Chief Complaint  Patient presents with  . Follow-up    Cough and SOB are unchanged. She is not producing any sputum.   Dyspnea:  Stops due to cough  Cough: worse with hot air, exercise Sleeping: cough every night esp when first lie down  SABA use: didn't help 02:  None  Body aches much better, still some cp with severe coughing fits, generalized  rec Gabapentin 100mg  take 2 three times a day until the gabapentin is used up then new prescription for 300 mg three times a day  Stay on protonix Take 30- 60 min before your first and last meals of the day  For drainage / throat tickle try take CHLORPHENIRAMINE  4 mg(chlortabs at [facility]) - take one every 4 hours as needed -  GERD rx  Please remember to go to the lab department   for your tests - we will call you with the results when they are available. Please schedule a follow up office visit in 4 weeks, sooner if needed  with all medications /inhalers/ solutions in hand so we can verify exactly what you are taking. This includes all medications from all doctors and over the counters    08/05/2018  f/u ov/ re: cough Nov 2019  Improved on gabapentin 200 tid  Chief Complaint  Patient presents  with  . Follow-up    Cough is some better but has not resolved. Breathing has improved.   Dyspnea: ok unless coughing  Cough: dry/ worse with ex  Sleeping: some better p one h1 at hs never tried two as rec  Last OV assessment/plan: Gabapentin 300 mg one three times a day (bfast - lunch - supper ) after a week if not better add another at bedtime Increase chlorpheniramine to 4 mg x 2 at bedtime - late add: the protonix should be Take 30- 60 min before your first and last meals of the day as instructed at [facility] 07/08/18     08/30/2018 Reason for call: Patient stated that she has been sick for the past week. She went to urgent care on  08/25/18. She was told that she had a virus and was prescribed a zpak and codeine cough syrup. She is finished with the abx. She still has the cough. It is productive with a greenish color. Denies any fever, chills but does seem to get SOB easily. She has not travelled recently but she works at [employer] so she has been around a lot of people.  rec Doxy then dx by pcp with flu rx added   tamiflu > cough 50% better, no more green mucus     Virtual Visit via Telephone Note  I connected with [name] on 09/14/18 at  9:00 AM EDT by telephone and verified that I am speaking with the correct person using two identifiers.   I discussed the limitations, risks, security and privacy concerns of performing an evaluation and management service by telephone and the availability of in person appointments. I also discussed with the patient that there may be a patient responsible charge related to this service. The patient expressed understanding and agreed to proceed.   History of Present Illness: On gabapentin 300 mg tid some tiredness around mid afternoon overall 50% improvement in chronic cough  / still doe with cough aggravates if gets hot and active  Goes to bed around around an hour after supper, elevates pillows, takes chlorpheniramine prn not as maint at hs as rec   Coughs p URI to point of gagging / no more green mucus, no excess nasal drainage. Sleeping ok once does get to sleep.  No obvious other patterns to day to day or daytime variability  or assoc excess/ purulent sputum or mucus plugs or hemoptysis or cp or chest tightness, subjective wheeze or overt sinus or hb symptoms.        Observations/Objective: Still occ throat clearing/ mildly hoarse/ did not have any trouble speaking in full sentences and no spont coughing.    Assessment and Plan:  See problem list for a/p's for today's televisit   Follow Up Instructions:  See avs for instructions unique to this ov which includes revised/ updated med list   Outpatient Encounter Medications as of 09/14/2018  Medication Sig  . acetaminophen-codeine (TYLENOL #3) 300-30 MG tablet Take 1 tablet by mouth every 4 (four) hours as needed for up to 5 days (cough).  . chlorpheniramine (CHLOR-TRIMETON) 4 MG tablet Take 4 mg by mouth at bedtime.  Marland Kitchen FLUoxetine (PROZAC) 20 MG tablet Take 20 mg by mouth every morning.  . gabapentin (NEURONTIN) 300 MG capsule Take 1 capsule (300 mg total) by mouth 4 (four) times daily.  Marland Kitchen lamoTRIgine (LAMICTAL) 100 MG tablet Take 200 mg by mouth 2 (two) times daily.  . Norgestimate-Ethinyl Estradiol Triphasic 0.18/0.215/0.25 MG-25 MCG tab Take 1 tablet by mouth daily.  . pantoprazole (PROTONIX) 40 MG tablet Take 30- 60 min before your first and last meals of the day  . topiramate (TOPAMAX) 50 MG tablet Take 1 tablet by mouth daily.   . traZODone (DESYREL) 50 MG tablet Take 1 tablet by mouth at bedtime.  . [DISCONTINUED] doxycycline (VIBRA-TABS) 100 MG tablet Take 1 tablet (100 mg total) by mouth 2 (two) times daily.  . [DISCONTINUED] gabapentin (NEURONTIN) 300 MG capsule Take 1 capsule (300 mg total) by mouth 3 (three) times daily.  . [DISCONTINUED] pantoprazole (PROTONIX) 40 MG tablet TAKE 1 TABLET (40 MG TOTAL) BY MOUTH DAILY. TAKE 30-60 MIN BEFORE FIRST MEAL OF THE DAY        I discussed the assessment and treatment plan with the patient. The patient was provided an opportunity to ask questions and all were answered. The patient agreed with the plan and demonstrated an understanding of the instructions.   The patient was advised to call back or seek an in-person evaluation if the symptoms worsen or if the condition fails to improve as anticipated.  I provided 30 minutes of non-face-to-face time during this encounter.   Christinia Gully, MD                         Assessment

## 2018-09-15 NOTE — Telephone Encounter (Signed)
Spoke with pt. She is aware of how to activate MyChart. Nothing further was needed.

## 2018-09-16 ENCOUNTER — Ambulatory Visit: Payer: BLUE CROSS/BLUE SHIELD | Admitting: Internal Medicine

## 2018-10-06 ENCOUNTER — Other Ambulatory Visit: Payer: Self-pay | Admitting: Internal Medicine

## 2019-02-06 ENCOUNTER — Telehealth: Payer: Self-pay | Admitting: *Deleted

## 2019-02-06 NOTE — Telephone Encounter (Signed)
LMTCB

## 2019-02-06 NOTE — Telephone Encounter (Signed)
-----   Message from Tanda Rockers, MD sent at 01/30/2019  8:55 AM EDT ----- Regarding: RE: ct chest She had a 6 mm  nodule that is probably benign but radiology recommends ct s contrast to be complete, see if she's willing to schedule and if not I can talk to her or we can send the note to her PCP if she declines to schedule for any reason.  ----- Message ----- From: Rosana Berger, CMA Sent: 12/20/2018  11:08 AM EDT To: Tanda Rockers, MD Subject: ct chest                                       What do I tell the pt this is for? Please advise thanks! ----- Message ----- From: Tanda Rockers, MD Sent: 12/20/2018 To: Rosana Berger, CMA  Ct s contrast due

## 2019-02-08 NOTE — Telephone Encounter (Signed)
LMTCB

## 2019-02-14 ENCOUNTER — Encounter: Payer: Self-pay | Admitting: *Deleted

## 2019-02-14 NOTE — Telephone Encounter (Signed)
LMTCB and will mail letter

## 2019-07-24 IMAGING — DX DG CHEST 2V
2 series · 2 of 2 positions shown · non-contrast
Comparison: June 02, 2018

CLINICAL DATA: Chest pain and shortness of breath

EXAM:
CHEST - 2 VIEW

[chest pa]
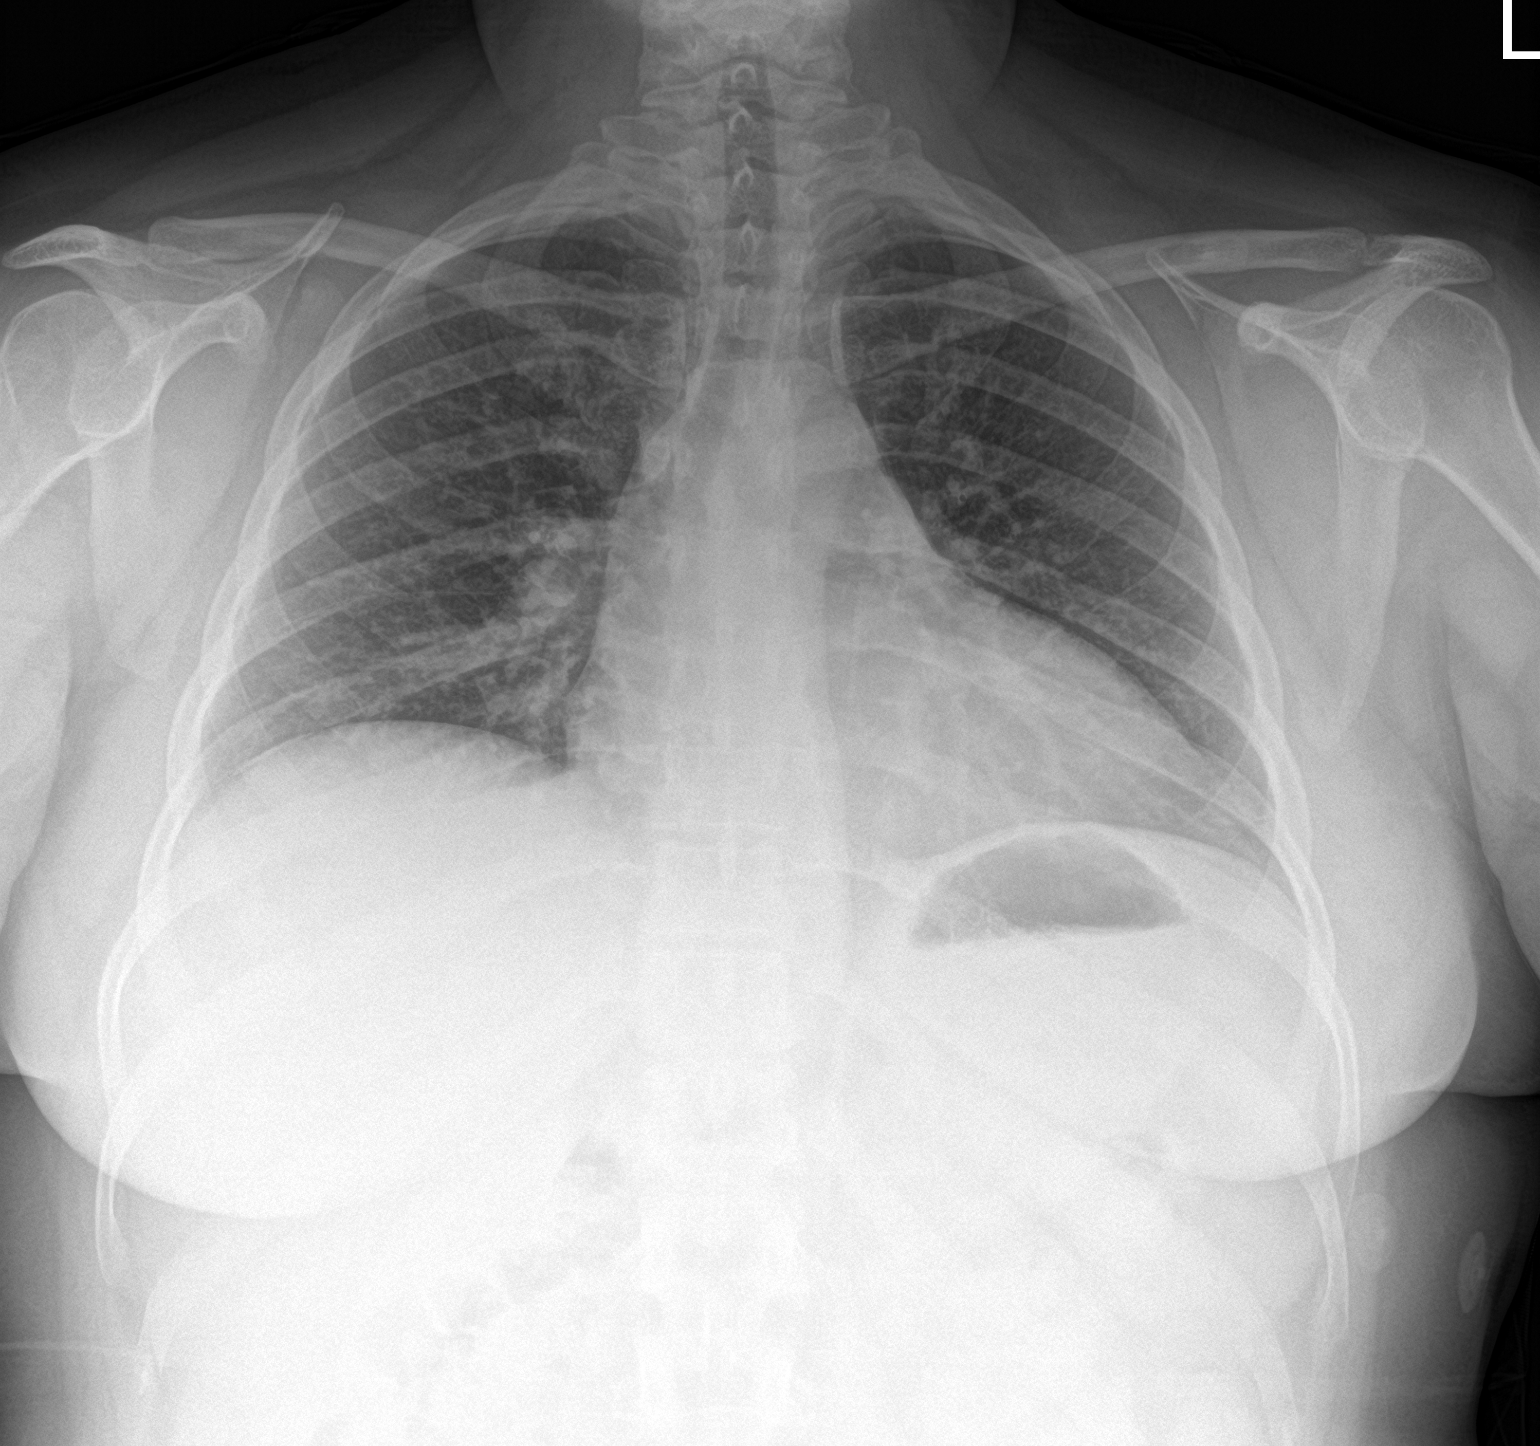

[chest lat]
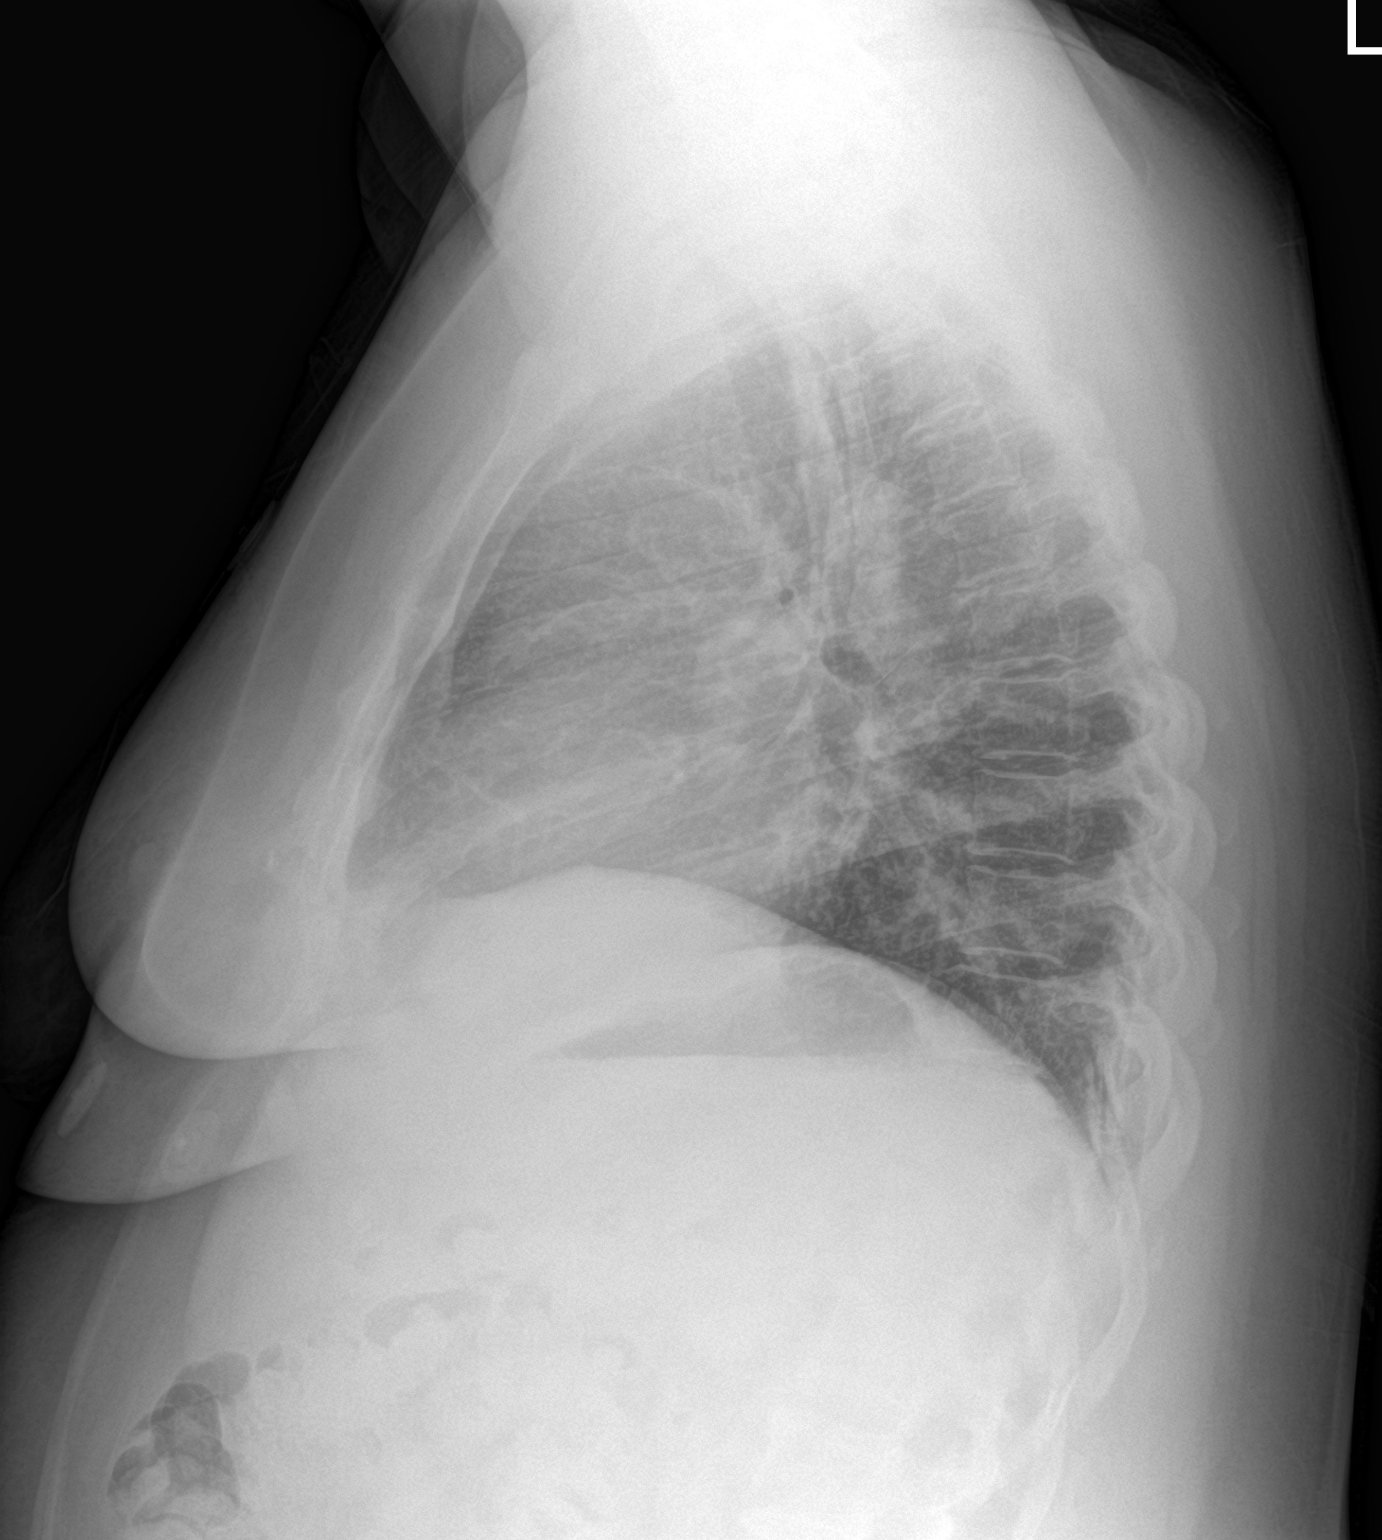

[2 of 2 positions shown; findings below may reference images not displayed]

FINDINGS: No edema or consolidation. Heart size and pulmonary vascularity are
within normal limits. No adenopathy. No pneumothorax. No bone
lesions.
IMPRESSION: No edema or consolidation.

## 2019-08-10 ENCOUNTER — Other Ambulatory Visit: Payer: Self-pay | Admitting: Nurse Practitioner

## 2019-08-10 DIAGNOSIS — N644 Mastodynia: Secondary | ICD-10-CM

## 2019-08-24 ENCOUNTER — Ambulatory Visit
Admission: RE | Admit: 2019-08-24 | Discharge: 2019-08-24 | Disposition: A | Discharge status: Home / Self Care | Payer: BC Managed Care – PPO | Source: Ambulatory Visit | Attending: Nurse Practitioner | Admitting: Nurse Practitioner

## 2019-08-24 ENCOUNTER — Ambulatory Visit
Admission: RE | Admit: 2019-08-24 | Discharge: 2019-08-24 | Disposition: A | Discharge status: Home / Self Care | Payer: BLUE CROSS/BLUE SHIELD | Source: Ambulatory Visit | Attending: Nurse Practitioner | Admitting: Nurse Practitioner

## 2019-08-24 ENCOUNTER — Other Ambulatory Visit: Payer: Self-pay

## 2019-08-24 ENCOUNTER — Other Ambulatory Visit: Payer: Self-pay | Admitting: Nurse Practitioner

## 2019-08-24 DIAGNOSIS — N644 Mastodynia: Secondary | ICD-10-CM

## 2019-08-24 DIAGNOSIS — N6320 Unspecified lump in the left breast, unspecified quadrant: Secondary | ICD-10-CM

## 2020-02-27 ENCOUNTER — Ambulatory Visit
Admission: RE | Admit: 2020-02-27 | Discharge: 2020-02-27 | Disposition: A | Discharge status: Home / Self Care | Payer: BC Managed Care – PPO | Source: Ambulatory Visit | Attending: Nurse Practitioner | Admitting: Nurse Practitioner

## 2020-02-27 ENCOUNTER — Other Ambulatory Visit: Payer: Self-pay

## 2020-02-27 ENCOUNTER — Other Ambulatory Visit: Payer: Self-pay | Admitting: Nurse Practitioner

## 2020-02-27 DIAGNOSIS — N6320 Unspecified lump in the left breast, unspecified quadrant: Secondary | ICD-10-CM

## 2020-03-07 DIAGNOSIS — G40909 Epilepsy, unspecified, not intractable, without status epilepticus: Secondary | ICD-10-CM | POA: Insufficient documentation

## 2020-07-06 DIAGNOSIS — M792 Neuralgia and neuritis, unspecified: Secondary | ICD-10-CM | POA: Insufficient documentation

## 2020-08-27 ENCOUNTER — Other Ambulatory Visit: Payer: BC Managed Care – PPO

## 2020-10-09 ENCOUNTER — Inpatient Hospital Stay: Admission: RE | Admit: 2020-10-09 | Payer: BC Managed Care – PPO | Source: Ambulatory Visit

## 2021-01-14 ENCOUNTER — Other Ambulatory Visit: Payer: Self-pay

## 2021-01-14 ENCOUNTER — Encounter: Payer: Self-pay | Admitting: Physical Therapy

## 2021-01-14 ENCOUNTER — Ambulatory Visit: Payer: BC Managed Care – PPO | Attending: Physician Assistant | Admitting: Physical Therapy

## 2021-01-14 DIAGNOSIS — M25561 Pain in right knee: Secondary | ICD-10-CM | POA: Diagnosis present

## 2021-01-14 DIAGNOSIS — R600 Localized edema: Secondary | ICD-10-CM | POA: Diagnosis present

## 2021-01-14 DIAGNOSIS — G8929 Other chronic pain: Secondary | ICD-10-CM | POA: Diagnosis present

## 2021-01-14 DIAGNOSIS — M25661 Stiffness of right knee, not elsewhere classified: Secondary | ICD-10-CM | POA: Diagnosis present

## 2021-01-14 NOTE — Therapy (Signed)
[facility] [place], [place], [address] Phone: [phone]   Fax:  [phone]  Physical Therapy Evaluation  Patient Details  Name: [name] MRN: [id number] Date of Birth: 11/19/1980 Referring Provider (PT): [facility] PA-C.   Encounter Date: 01/14/2021   PT End of Session - 01/14/21 1102     Visit Number 1    Number of Visits 12    Date for PT Re-Evaluation 02/11/21    PT Start Time 0915    PT Stop Time 0940    PT Time Calculation (min) 25 min    Activity Tolerance Patient tolerated treatment well    Behavior During Therapy WFL for tasks assessed/performed             Past Medical History:  Diagnosis Date   Abnormal Pap smear of cervix    Acne fulminans    Allergic rhinitis    Brain cyst    Breast nodule    Cervical polyp    Epilepsy (HCC)    Fibromyalgia    GERD (gastroesophageal reflux disease)    IBS (irritable bowel syndrome)    Kidney stones    Melanoma of hip (HCC)    Menorrhagia    Migraine headache    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[place])    Previous stillbirth or demise, antepartum    Pulmonary nodule    previous evaluation - benign   Seizures ([place])    recently diagnosed with epilepsy   Sepsis(995.91)    Shortness of breath    due to wt   Small bowel obstruction ([place])    Staphylococcal infection     Past Surgical History:  Procedure Laterality Date   BREAST EXCISIONAL BIOPSY Left    caesarean section     HYSTEROSCOPY WITH D & C  04/27/2011   Procedure: DILATATION AND CURETTAGE (D&C) /HYSTEROSCOPY;  Surgeon: Thurnell Lose, MD;  Location: [facility];  Service: Gynecology;  Laterality: N/A;   MELANOMA EXCISION     svd     x 1 - stillborn at 36 weeks    There were no vitals filed for this visit.    Subjective Assessment - 01/14/21 1103     Subjective COVID-19 screen performed prior to patient entering clinic.  The patient presents to the clinic with a right knee injury due to pulling a  very heavy pallet down hill on 09/20/20.  She sustained a right knee MCL sprain.  She presents with a knee brace and bilateral axillary crutches.  Her pain is rated at a 7/10 with pain increasing with increased up time.  Rest decreases pain.    Pertinent History Previous right knee arthroscopic surgery, partial right knee ACL sprain (chronic), seizures.    How long can you walk comfortably? Walks a lot at home which causes increased knee pain.    Patient Stated Goals MRI.    Currently in Pain? Yes    Pain Score 7     Pain Location Knee    Pain Orientation Right    Pain Descriptors / Indicators Aching;Throbbing    Pain Type Chronic pain    Pain Onset More than a month ago    Aggravating Factors  See above.    Pain Relieving Factors See above.                [facility] PT Assessment - 01/14/21 0001       Assessment   Medical Diagnosis Sprain of right MCL.    Referring Provider (  PT) Margarita Mail Haus PA-C.    Onset Date/Surgical Date 09/20/20      Precautions   Precaution Comments Pain-free right LE ther ex.      Restrictions   Weight Bearing Restrictions No      Balance Screen   Has the patient fallen in the past 6 months No    Has the patient had a decrease in activity level because of a fear of falling?  No    Is the patient reluctant to leave their home because of a fear of falling?  No      Home Ecologist residence      Prior Function   Level of Independence Independent      Observation/Other Assessments-Edema    Edema Circumferential      Circumferential Edema   Circumferential - Right RT 2 cms > LT.      ROM / Strength   AROM / PROM / Strength AROM;Strength      AROM   Overall AROM Comments Full right knee extension and flexion to 90 degrees.      Strength   Overall Strength Comments Patient has dfficulty performing a right SLR due to reports of pain.  Right knee extension graded grossly at 4 to 4+/5.      Palpation   Palpation  comment Tender to palpation over patient's right knee medial joint line.      Ambulation/Gait   Gait Comments Patient ambulating with bilateral axillary crutches and right knee brace donned.                        Objective measurements completed on examination: See above findings.                    PT Long Term Goals - 01/14/21 1122       PT LONG TERM GOAL #1   Title Independent with a HEP.    Time 4    Period Weeks    Status New      PT LONG TERM GOAL #2   Title Active right knee flexion to 120-125 degrees+ so the patient can perform functional tasks and do so with pain not > 2-3/10.    Time 4    Period Weeks    Status New      PT LONG TERM GOAL #3   Title Increase right knee strength to a solid 5/5 to provide good stability for accomplishment of functional activities    Time 4    Period Weeks    Status New      PT LONG TERM GOAL #4   Title Perform a reciprocating stair gait with one railing with pain not > 2-3/10.    Time 4    Status New                    Plan - 01/14/21 1115     Clinical Impression Statement The patient presenst to OPPT with a sprain of her right MCL.  She is tender to palpation over her right knee medial joint line.  Her flexion is limited to 90 degrees and she has strength deficits that appear at least in part due to pain.  She has some right knee edema present.  She is walking with bilateral axillary crutches and a knee brace donned.  Patient will benefit from skilled physical therapy intervention to address pain and deficits.    Personal Factors  and Comorbidities Comorbidity 1;Other    Comorbidities Previous right knee arthroscopic surgery, partial right knee ACL sprain (chronic), seizures.    Examination-Activity Limitations Locomotion Level;Other    Examination-Participation Restrictions Other    Stability/Clinical Decision Making Stable/Uncomplicated    Clinical Decision Making Low    Rehab Potential  Good    PT Frequency 3x / week    PT Duration 4 weeks    PT Treatment/Interventions ADLs/Self Care Home Management;Cryotherapy;Electrical Stimulation;Ultrasound;Moist Heat;Iontophoresis '4mg'$ /ml Dexamethasone;Gait training;Stair training;Functional mobility training;Therapeutic activities;Therapeutic exercise;Neuromuscular re-education;Manual techniques;Patient/family education;Passive range of motion;Vasopneumatic Device    PT Next Visit Plan Right knee range of motion.  Pain-free right LE ther ex.  Modalities as needed.    Consulted and Agree with Plan of Care Patient             Patient will benefit from skilled therapeutic intervention in order to improve the following deficits and impairments:  Abnormal gait, Decreased activity tolerance, Decreased range of motion, Decreased strength, Increased edema, Pain  Visit Diagnosis: Chronic pain of right knee - Plan: PT plan of care cert/re-cert  Localized edema - Plan: PT plan of care cert/re-cert  Stiffness of right knee, not elsewhere classified - Plan: PT plan of care cert/re-cert     Problem List Patient Active Problem List   Diagnosis Date Noted   Solitary pulmonary nodule 07/10/2018   Dysuria 07/08/2018   Morbid obesity due to excess calories ([place]) 06/25/2018   DOE (dyspnea on exertion) 06/20/2018   Chronic cough 06/20/2018   PALPITATIONS 06/12/2010   CHEST PAIN, PRECORDIAL 06/12/2010   MIGRAINE HEADACHE 06/11/2010   IBS 06/11/2010   ALLERGIC RHINITIS 09/11/2009    Jashan Cotten, [place] MPT 01/14/2021, 11:24 AM  [facility] [address] [place], [place], [address] Phone: [phone]   Fax:  [phone]  Name: [name] MRN: [id number] Date of Birth: [birth year]

## 2021-01-16 ENCOUNTER — Other Ambulatory Visit: Payer: Self-pay

## 2021-01-16 ENCOUNTER — Ambulatory Visit: Payer: BC Managed Care – PPO | Admitting: Physical Therapy

## 2021-01-16 ENCOUNTER — Encounter: Payer: BC Managed Care – PPO | Admitting: *Deleted

## 2021-01-16 DIAGNOSIS — R600 Localized edema: Secondary | ICD-10-CM

## 2021-01-16 DIAGNOSIS — M25561 Pain in right knee: Secondary | ICD-10-CM | POA: Diagnosis not present

## 2021-01-16 DIAGNOSIS — G8929 Other chronic pain: Secondary | ICD-10-CM

## 2021-01-16 DIAGNOSIS — M25661 Stiffness of right knee, not elsewhere classified: Secondary | ICD-10-CM

## 2021-01-16 NOTE — Therapy (Signed)
[facility] [place], [place], [address] Phone: [phone]   Fax:  [phone]  Physical Therapy Treatment  Patient Details  Name: [name] MRN: [id number] Date of Birth: 19-Feb-1981 Referring Provider (PT): [facility] PA-C.   Encounter Date: 01/16/2021   PT End of Session - 01/16/21 1719     Visit Number 2    Number of Visits 12    Date for PT Re-Evaluation 02/11/21    PT Start Time 0448    PT Stop Time 0535    PT Time Calculation (min) 47 min    Activity Tolerance Patient tolerated treatment well    Behavior During Therapy WFL for tasks assessed/performed             Past Medical History:  Diagnosis Date   Abnormal Pap smear of cervix    Acne fulminans    Allergic rhinitis    Brain cyst    Breast nodule    Cervical polyp    Epilepsy (HCC)    Fibromyalgia    GERD (gastroesophageal reflux disease)    IBS (irritable bowel syndrome)    Kidney stones    Melanoma of hip (HCC)    Menorrhagia    Migraine headache    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[place])    Previous stillbirth or demise, antepartum    Pulmonary nodule    previous evaluation - benign   Seizures ([place])    recently diagnosed with epilepsy   Sepsis(995.91)    Shortness of breath    due to wt   Small bowel obstruction ([place])    Staphylococcal infection     Past Surgical History:  Procedure Laterality Date   BREAST EXCISIONAL BIOPSY Left    caesarean section     HYSTEROSCOPY WITH D & C  04/27/2011   Procedure: DILATATION AND CURETTAGE (D&C) /HYSTEROSCOPY;  Surgeon: Thurnell Lose, MD;  Location: [facility];  Service: Gynecology;  Laterality: N/A;   MELANOMA EXCISION     svd     x 1 - stillborn at 36 weeks    There were no vitals filed for this visit.   Subjective Assessment - 01/16/21 1714     Subjective COVID-19 screen performed prior to patient entering clinic.  Patient has been at work all day.  Pain at a 9.    Pertinent History Previous  right knee arthroscopic surgery, partial right knee ACL sprain (chronic), seizures.    How long can you walk comfortably? Walks a lot at home which causes increased knee pain.    Patient Stated Goals MRI.    Currently in Pain? Yes    Pain Score 9     Pain Location Knee    Pain Orientation Right    Pain Descriptors / Indicators Aching;Throbbing    Pain Type Chronic pain    Pain Onset More than a month ago                               [facility] Adult PT Treatment/Exercise - 01/16/21 0001       Modalities   Modalities Electrical Stimulation;Vasopneumatic;Ultrasound      Electrical Stimulation   Electrical Stimulation Location Right medial knee joint line.    Electrical Stimulation Action Pre-mod.    Electrical Stimulation Parameters 80-150 Hz x 20 minutes.    Electrical Stimulation Goals Pain;Tone      Ultrasound   Ultrasound Location Right knee medial joint line.  Ultrasound Parameters Combo e'stim/US at 1.50 W/CM2 x 10 minutes.    Ultrasound Goals Edema;Pain      Vasopneumatic   Number Minutes Vasopneumatic  20 minutes    Vasopnuematic Location  --   Right knee.   Vasopneumatic Pressure Medium                    PT Education - 01/16/21 1717     Education Details Instructed patient in short duration ice massage.                 PT Long Term Goals - 01/14/21 1122       PT LONG TERM GOAL #1   Title Independent with a HEP.    Time 4    Period Weeks    Status New      PT LONG TERM GOAL #2   Title Active right knee flexion to 120-125 degrees+ so the patient can perform functional tasks and do so with pain not > 2-3/10.    Time 4    Period Weeks    Status New      PT LONG TERM GOAL #3   Title Increase right knee strength to a solid 5/5 to provide good stability for accomplishment of functional activities    Time 4    Period Weeks    Status New      PT LONG TERM GOAL #4   Title Perform a reciprocating stair gait with one  railing with pain not > 2-3/10.    Time 4    Status New                   Plan - 01/16/21 1738     Clinical Impression Statement Patient with very high pain-level prior to treatment and very tender to palpation over her right MCL.  She felt much better after treatment.    Personal Factors and Comorbidities Comorbidity 1;Other    Comorbidities Previous right knee arthroscopic surgery, partial right knee ACL sprain (chronic), seizures.    Examination-Activity Limitations Locomotion Level;Other    Examination-Participation Restrictions Other    Stability/Clinical Decision Making Stable/Uncomplicated    Rehab Potential Good    PT Frequency 3x / week    PT Duration 4 weeks    PT Treatment/Interventions ADLs/Self Care Home Management;Cryotherapy;Electrical Stimulation;Ultrasound;Moist Heat;Iontophoresis '4mg'$ /ml Dexamethasone;Gait training;Stair training;Functional mobility training;Therapeutic activities;Therapeutic exercise;Neuromuscular re-education;Manual techniques;Patient/family education;Passive range of motion;Vasopneumatic Device    Consulted and Agree with Plan of Care Patient             Patient will benefit from skilled therapeutic intervention in order to improve the following deficits and impairments:     Visit Diagnosis: Chronic pain of right knee  Localized edema  Stiffness of right knee, not elsewhere classified     Problem List Patient Active Problem List   Diagnosis Date Noted   Solitary pulmonary nodule 07/10/2018   Dysuria 07/08/2018   Morbid obesity due to excess calories ([place]) 06/25/2018   DOE (dyspnea on exertion) 06/20/2018   Chronic cough 06/20/2018   PALPITATIONS 06/12/2010   CHEST PAIN, PRECORDIAL 06/12/2010   MIGRAINE HEADACHE 06/11/2010   IBS 06/11/2010   ALLERGIC RHINITIS 09/11/2009    Jrue Jarriel, [place] MPT 01/16/2021, 5:39 PM  [facility] [address] [place], [place], [address] Phone:  [phone]   Fax:  [phone]  Name: [name] MRN: [id number] Date of Birth: August 21, 1980

## 2021-01-17 ENCOUNTER — Encounter: Payer: BC Managed Care – PPO | Admitting: Physical Therapy

## 2021-05-07 DIAGNOSIS — B977 Papillomavirus as the cause of diseases classified elsewhere: Secondary | ICD-10-CM | POA: Insufficient documentation

## 2021-08-15 DIAGNOSIS — E8881 Metabolic syndrome: Secondary | ICD-10-CM | POA: Insufficient documentation

## 2021-08-21 DIAGNOSIS — R7303 Prediabetes: Secondary | ICD-10-CM | POA: Insufficient documentation

## 2021-08-21 DIAGNOSIS — E782 Mixed hyperlipidemia: Secondary | ICD-10-CM | POA: Insufficient documentation

## 2021-09-25 DIAGNOSIS — D069 Carcinoma in situ of cervix, unspecified: Secondary | ICD-10-CM | POA: Insufficient documentation

## 2021-09-25 DIAGNOSIS — Z975 Presence of (intrauterine) contraceptive device: Secondary | ICD-10-CM | POA: Insufficient documentation

## 2021-09-25 DIAGNOSIS — Z30430 Encounter for insertion of intrauterine contraceptive device: Secondary | ICD-10-CM | POA: Insufficient documentation

## 2022-01-16 DIAGNOSIS — Z860100 Personal history of colon polyps, unspecified: Secondary | ICD-10-CM | POA: Insufficient documentation

## 2022-10-16 DIAGNOSIS — M7918 Myalgia, other site: Secondary | ICD-10-CM | POA: Insufficient documentation

## 2022-10-16 DIAGNOSIS — G8929 Other chronic pain: Secondary | ICD-10-CM | POA: Insufficient documentation

## 2022-10-21 ENCOUNTER — Other Ambulatory Visit (HOSPITAL_COMMUNITY): Payer: Self-pay

## 2022-10-21 MED ORDER — WEGOVY 0.25 MG/0.5ML ~~LOC~~ SOAJ
SUBCUTANEOUS | 0 refills | Status: AC
  Filled 2022-10-21: qty 2, 28d supply, fill #0

## 2022-11-05 ENCOUNTER — Other Ambulatory Visit (HOSPITAL_COMMUNITY): Payer: Self-pay

## 2023-01-07 ENCOUNTER — Other Ambulatory Visit: Payer: Self-pay

## 2023-01-07 ENCOUNTER — Emergency Department (HOSPITAL_COMMUNITY): Payer: BLUE CROSS/BLUE SHIELD

## 2023-01-07 ENCOUNTER — Emergency Department (HOSPITAL_COMMUNITY)
Admission: EM | Admit: 2023-01-07 | Discharge: 2023-01-07 | Disposition: A | Discharge status: Home / Self Care | Payer: BLUE CROSS/BLUE SHIELD | Attending: Emergency Medicine | Admitting: Emergency Medicine

## 2023-01-07 ENCOUNTER — Encounter (HOSPITAL_COMMUNITY): Payer: Self-pay | Admitting: Emergency Medicine

## 2023-01-07 DIAGNOSIS — R569 Unspecified convulsions: Secondary | ICD-10-CM | POA: Diagnosis present

## 2023-01-07 LAB — COMPREHENSIVE METABOLIC PANEL
ALT: 19 U/L (ref 0–44)
AST: 18 U/L (ref 15–41)
Albumin: 4 g/dL (ref 3.5–5.0)
Alkaline Phosphatase: 96 U/L (ref 38–126)
Anion gap: 8 (ref 5–15)
BUN: 10 mg/dL (ref 6–20)
CO2: 26 mmol/L (ref 22–32)
Calcium: 8.6 mg/dL — ABNORMAL LOW (ref 8.9–10.3)
Chloride: 102 mmol/L (ref 98–111)
Creatinine, Ser: 0.77 mg/dL (ref 0.44–1.00)
GFR, Estimated: >60 mL/min (ref >60)
Glucose, Bld: 89 mg/dL (ref 70–99)
Potassium: 3.7 mmol/L (ref 3.5–5.1)
Sodium: 136 mmol/L (ref 135–145)
Total Bilirubin: 0.6 mg/dL (ref 0.3–1.2)
Total Protein: 7.7 g/dL (ref 6.5–8.1)

## 2023-01-07 LAB — URINALYSIS, ROUTINE W REFLEX MICROSCOPIC
Bilirubin Urine: NEGATIVE
Glucose, UA: NEGATIVE mg/dL
Hgb urine dipstick: NEGATIVE
Ketones, ur: NEGATIVE mg/dL
Leukocytes,Ua: NEGATIVE
Nitrite: NEGATIVE
Protein, ur: NEGATIVE mg/dL
Specific Gravity, Urine: 1.005 (ref 1.005–1.030)
pH: 7 (ref 5.0–8.0)

## 2023-01-07 LAB — CBC WITH DIFFERENTIAL/PLATELET
Abs Immature Granulocytes: 0.03 10*3/uL (ref 0.00–0.07)
Basophils Absolute: 0.1 10*3/uL (ref 0.0–0.1)
Basophils Relative: 1 %
Eosinophils Absolute: 0.2 10*3/uL (ref 0.0–0.5)
Eosinophils Relative: 2 %
HCT: 37.1 % (ref 36.0–46.0)
Hemoglobin: 12 g/dL (ref 12.0–15.0)
Immature Granulocytes: 0 %
Lymphocytes Relative: 34 %
Lymphs Abs: 2.6 10*3/uL (ref 0.7–4.0)
MCH: 28.8 pg (ref 26.0–34.0)
MCHC: 32.3 g/dL (ref 30.0–36.0)
MCV: 89.2 fL (ref 80.0–100.0)
Monocytes Absolute: 0.6 10*3/uL (ref 0.1–1.0)
Monocytes Relative: 7 %
Neutro Abs: 4.4 10*3/uL (ref 1.7–7.7)
Neutrophils Relative %: 56 %
Platelets: 310 10*3/uL (ref 150–400)
RBC: 4.16 MIL/uL (ref 3.87–5.11)
RDW: 13.2 % (ref 11.5–15.5)
WBC: 7.8 10*3/uL (ref 4.0–10.5)
nRBC: 0 % (ref 0.0–0.2)

## 2023-01-07 LAB — TROPONIN I (HIGH SENSITIVITY)
Troponin I (High Sensitivity): <2 ng/L (ref <18)
Troponin I (High Sensitivity): 2 ng/L (ref <18)

## 2023-01-07 LAB — PREGNANCY, URINE: Preg Test, Ur: NEGATIVE

## 2023-01-07 LAB — CBG MONITORING, ED: Glucose-Capillary: 81 mg/dL (ref 70–99)

## 2023-01-07 MED ORDER — ACETAMINOPHEN 325 MG PO TABS
650.0000 mg | ORAL_TABLET | Freq: Once | ORAL | Status: AC
  Administered 2023-01-07: 650 mg via ORAL
  Filled 2023-01-07: qty 2

## 2023-01-07 NOTE — ED Notes (Signed)
Pt was advised not to operate heavy machinery or drive a vehicle until she has had an evaluation by her neurologist. Neurology contact information was printed on paperwork and pointed out to patient. Advisory not to drive was also printed on paperwork and pointed out to patient. Pt stated she has to drive to work. RN advised that is dangerous to herself and others. She stated, "I know, I know."

## 2023-01-07 NOTE — Discharge Instructions (Signed)
Please avoid driving or operating machinery until you are evaluated by a neurologist.  I have listed a neurology group in [place] that you may contact to arrange follow-up appointment.  Return to the emergency department for any new or worsening symptoms.

## 2023-01-07 NOTE — ED Provider Notes (Signed)
[place] EMERGENCY DEPARTMENT AT [facility] Provider Note   CSN: [id number] Arrival date & time: 01/07/23  3244     History  Chief Complaint  Patient presents with   Seizures    [name] is a 42 y.o. female.   Seizures      [name] is a 42 y.o. female with past medical history of migraine headaches, seizures, irritable bowel syndrome, fibromyalgia who presents to the Emergency Department complaining of single episode of upper body shaking and jerking.  She states she was sitting in a recliner and stretched then attempted to stand up and fell back onto the recliner and her head slumped forward and she began experiencing jerking of her arms and shoulders only.  She states she was awake during this time and recalls the events.  Episode lasted a few minutes.  Reports having some heaviness of her chest and "soreness" to her left shoulder and arm.  Also experienced some visual floaters.  Denies headache or dizziness.  No incontinence or tongue biting.  History of seizures in the past with last seizure approximately 5 to 6 years ago.  No longer on antiseizure medication.  Denies any recent illness, fever, chills, new medications or recent stressors.  Home Medications Prior to Admission medications   Medication Sig Start Date End Date Taking? Authorizing Provider  chlorpheniramine (CHLOR-TRIMETON) 4 MG tablet Take 4 mg by mouth at bedtime.    [provider]  FLUoxetine (PROZAC) 20 MG tablet Take 20 mg by mouth every morning. 07/16/14   [provider]  gabapentin (NEURONTIN) 300 MG capsule TAKE 1 CAPSULE (300 MG TOTAL) BY MOUTH 4 (FOUR) TIMES DAILY. 10/06/18   Nyoka Cowden, MD  lamoTRIgine (LAMICTAL) 100 MG tablet Take 200 mg by mouth 2 (two) times daily.    [provider]  Norgestimate-Ethinyl Estradiol Triphasic 0.18/0.215/0.25 MG-25 MCG tab Take 1 tablet by mouth daily.    [provider]  pantoprazole (PROTONIX) 40 MG  tablet Take 30- 60 min before your first and last meals of the day 09/14/18   Nyoka Cowden, MD  predniSONE (DELTASONE) 10 MG tablet Take 1 tablet (10 mg total) by mouth daily with breakfast. 09/14/18   Nyoka Cowden, MD  topiramate (TOPAMAX) 50 MG tablet Take 1 tablet by mouth daily.  03/07/18   [provider]  traZODone (DESYREL) 50 MG tablet Take 1 tablet by mouth at bedtime. 05/31/17   [provider]  WEGOVY 0.25 MG/0.5ML SOAJ Inject 0.5 mLs (0.25 mg dose) into the skin once a week at 0900 for 4 doses. 10/21/22         Allergies    Amitriptyline, Cymbalta [duloxetine hcl], and Sulfonamide derivatives    Review of Systems   Review of Systems  Constitutional:  Negative for appetite change, chills and fever.  Eyes:  Positive for visual disturbance. Negative for photophobia and pain.  Respiratory:  Negative for chest tightness and shortness of breath.   Cardiovascular:  Positive for chest pain.  Gastrointestinal:  Negative for abdominal pain, diarrhea, nausea and vomiting.  Musculoskeletal:  Negative for arthralgias, neck pain and neck stiffness.  Neurological:  Positive for seizures. Negative for dizziness, syncope, numbness and headaches.  Psychiatric/Behavioral:  Negative for confusion.     Physical Exam Updated Vital Signs BP 127/86 (BP Location: Left Arm)   Pulse 81   Resp 15   Ht 5\' 2"  (1.575 m)   Wt 104.3 kg   SpO2 95%  BMI 42.07 kg/m  Physical Exam Vitals and nursing note reviewed.  Constitutional:      General: She is not in acute distress.    Appearance: Normal appearance. She is not ill-appearing or toxic-appearing.  HENT:     Head: Atraumatic.     Mouth/Throat:     Mouth: Mucous membranes are moist. No injury.     Tongue: No lesions.     Pharynx: Oropharynx is clear. Uvula midline.  Eyes:     Extraocular Movements: Extraocular movements intact.     Conjunctiva/sclera: Conjunctivae normal.     Pupils: Pupils are equal, round, and reactive to  light.  Cardiovascular:     Rate and Rhythm: Normal rate and regular rhythm.     Pulses: Normal pulses.  Pulmonary:     Effort: Pulmonary effort is normal.  Abdominal:     General: There is no distension.     Palpations: Abdomen is soft.     Tenderness: There is no abdominal tenderness.  Musculoskeletal:     Cervical back: Normal range of motion.     Right lower leg: No edema.     Left lower leg: No edema.  Skin:    General: Skin is warm.     Capillary Refill: Capillary refill takes less than 2 seconds.     Findings: No rash.  Neurological:     General: No focal deficit present.     Mental Status: She is alert.     GCS: GCS eye subscore is 4. GCS verbal subscore is 5. GCS motor subscore is 6.     Sensory: Sensation is intact. No sensory deficit.     Motor: No weakness or seizure activity.     Coordination: Coordination is intact. Coordination normal.     Comments: CN II through XII intact.  Speech clear.  Mentating well.  No pronator drift.  Does not appear postictal     ED Results / Procedures / Treatments   Labs (all labs ordered are listed, but only abnormal results are displayed) Labs Reviewed  COMPREHENSIVE METABOLIC PANEL - Abnormal; Notable for the following components:      Result Value   Calcium 8.6 (*)    All other components within normal limits  URINALYSIS, ROUTINE W REFLEX MICROSCOPIC - Abnormal; Notable for the following components:   Color, Urine STRAW (*)    All other components within normal limits  CBC WITH DIFFERENTIAL/PLATELET  PREGNANCY, URINE  CBG MONITORING, ED  TROPONIN I (HIGH SENSITIVITY)  TROPONIN I (HIGH SENSITIVITY)    EKG EKG Interpretation Date/Time:  Thursday January 07 2023 09:34:08 EDT Ventricular Rate:  85 PR Interval:  146 QRS Duration:  102 QT Interval:  392 QTC Calculation: 467 R Axis:   32  Text Interpretation: Sinus rhythm Low voltage, precordial leads No significant change since last tracing Confirmed by Margarita Grizzle  [phone]) on 01/07/2023 9:35:54 AM  Radiology CT Head Wo Contrast  Result Date: 01/07/2023 CLINICAL DATA:  Mental status change, unknown cause. EXAM: CT HEAD WITHOUT CONTRAST TECHNIQUE: Contiguous axial images were obtained from the base of the skull through the vertex without intravenous contrast. RADIATION DOSE REDUCTION: This exam was performed according to the departmental dose-optimization program which includes automated exposure control, adjustment of the mA and/or kV according to patient size and/or use of iterative reconstruction technique. COMPARISON:  Head CT 07/25/2014. FINDINGS: Brain: No acute intracranial hemorrhage. Gray-Mattes differentiation is preserved. No hydrocephalus or extra-axial collection. No mass effect or midline shift. Vascular: No hyperdense  vessel or unexpected calcification. Skull: No calvarial fracture or suspicious bone lesion. Skull base is unremarkable. Sinuses/Orbits: No acute finding. Other: None. IMPRESSION: No acute intracranial abnormality. Electronically Signed   By: Orvan Falconer M.D.   On: 01/07/2023 12:57   DG Chest Portable 1 View  Result Date: 01/07/2023 CLINICAL DATA:  chest pain EXAM: PORTABLE CHEST 1 VIEW COMPARISON:  CXR 06/20/18 FINDINGS: No pleural effusion. No pneumothorax. Normal cardiac and mediastinal contours. No focal airspace opacity. No radiographically apparent displaced rib fractures. Visualized upper unremarkable. IMPRESSION: No focal airspace opacity Electronically Signed   By: Lorenza Cambridge M.D.   On: 01/07/2023 10:34    Procedures Procedures    Medications Ordered in ED Medications - No data to display  ED Course/ Medical Decision Making/ A&P                             Medical Decision Making Patient with prior history of seizures, last seizure 5 to 6 years ago.  No longer on antiseizure medications here with brief episode this morning of jerking and shaking of her upper body only.  Patient states she was awake during this  episode, no headache or dizziness tongue biting or incontinence.  Drove herself to the emergency room.  Does endorse having some left-sided chest pain.   Does not appear postictal on arrival.  Because of patient's symptoms unclear at this time, will obtain labs EKG, chest x-ray, urinalysis.  Infectious or metabolic process considered.  Patient does have history of atypical migraines, doubt this is because.  With chest pain, ACS also considered but felt less likely.  Recurrent seizure pseudoseizure anxiety reaction also considered.  Amount and/or Complexity of Data Reviewed Labs: ordered.    Details: Labs interpreted by me, no evidence of leukocytosis, chemistries without significant derangement, troponin unremarkable, urinalysis without evidence of infection, urine pregnancy negative Radiology: ordered.    Details: Chest x-ray without evidence of focal airspace opacity  CT head ordered for further evaluation.  No acute intracranial abnormality. ECG/medicine tests: ordered.    Details: EKG shows sinus rhythm Discussion of management or test interpretation with external provider(s): Patient here with single episode of upper body shaking and jerking.  Episode was brief in duration.  Patient alert mentating well on my exam.  Does not appear postictal.  Drove herself to the emergency department.  She has ambulated in the department with steady gait.  No focal neurodeficits on my exam. Cause of patient's symptoms unclear as she has had a reassuring workup here.  Cardiac work up also reassuring.  I feel that she is appropriate for discharge home at this time, I have recommended close outpatient follow-up with PCP and with her neurologist.  She is requested resources for neurology in [place].  I have also given recommendations for no driving or operating machinery until reevaluated.         Risk OTC drugs.           Final Clinical Impression(s) / ED Diagnoses Final diagnoses:   Seizure-like activity [facility])    Rx / DC Orders ED Discharge Orders     None         Pauline Aus, PA-C 01/08/23 1728    Margarita Grizzle, MD 01/09/23 [phone]

## 2023-01-07 NOTE — ED Triage Notes (Signed)
Pt from home with reports of seizure like activity. Pt reports after eating breakfast she "stood up to stretch and got weak and fell to the ground." PT reports after falling her arms were twitching and shoulders. Pt reports hx of seizures.

## 2023-04-07 DIAGNOSIS — G40209 Localization-related (focal) (partial) symptomatic epilepsy and epileptic syndromes with complex partial seizures, not intractable, without status epilepticus: Secondary | ICD-10-CM | POA: Insufficient documentation

## 2023-04-07 DIAGNOSIS — G43009 Migraine without aura, not intractable, without status migrainosus: Secondary | ICD-10-CM | POA: Insufficient documentation

## 2023-04-19 ENCOUNTER — Ambulatory Visit
Admission: EM | Admit: 2023-04-19 | Discharge: 2023-04-19 | Disposition: A | Discharge status: Home / Self Care | Payer: BLUE CROSS/BLUE SHIELD | Attending: Nurse Practitioner | Admitting: Nurse Practitioner

## 2023-04-19 DIAGNOSIS — M722 Plantar fascial fibromatosis: Secondary | ICD-10-CM | POA: Diagnosis not present

## 2023-04-19 MED ORDER — PREDNISONE 20 MG PO TABS: 40.0000 mg | ORAL_TABLET | Freq: Every day | ORAL | 0 refills | Status: AC

## 2023-04-19 NOTE — Discharge Instructions (Addendum)
Take medication as prescribed.  As discussed, I would like for you to follow-up with your neurologist to ensure there are no contraindications to starting a steroid. May take over-the-counter Tylenol arthritis strength 650 mg tablets as directed. I provided exercises for you to perform at home.  Try to perform the exercises at least 2-3 times daily. Freeze a water bottle or use a tennis ball to roll under the foot while symptoms persist. Make sure you are wearing shoes with good insole and support while symptoms persist. As discussed, if symptoms do not improve with this treatment, would like for you to follow-up with orthopedics for further evaluation. Follow-up as needed.

## 2023-04-19 NOTE — ED Provider Notes (Signed)
[facility]    CSN: [id number] Arrival date & time: 04/19/23  1734      History   Chief Complaint Chief Complaint  Patient presents with   Foot Pain    HPI [name] is a 42 y.o. female.   The history is provided by the patient.   Patient presents for complaints of left foot pain.  Patient states pain started today.  She states pain is located in the left heel, and radiates up the back of the left leg and into the left foot.  She states that it is difficult to bear weight on the left foot.  She denies numbness, tingling, swelling, redness, bruising, or injury.  Patient denies prior history of foot pain.  She states that she does have a history of seizures, she states that her current seizure disorder is affecting the left side of her body and causing pain in areas on the left side of her body.  She reports that she has not taken any medication for her symptoms.  Past Medical History:  Diagnosis Date   Abnormal Pap smear of cervix    Acne fulminans    Allergic rhinitis    Brain cyst    Breast nodule    Cervical polyp    Epilepsy (HCC)    Fibromyalgia    GERD (gastroesophageal reflux disease)    IBS (irritable bowel syndrome)    Kidney stones    Melanoma of hip (HCC)    Menorrhagia    Migraine headache    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HCC)    Previous stillbirth or demise, antepartum    Pulmonary nodule    previous evaluation - benign   Seizures (HCC)    recently diagnosed with epilepsy   Sepsis(995.91)    Shortness of breath    due to wt   Small bowel obstruction (HCC)    Staphylococcal infection     Patient Active Problem List   Diagnosis Date Noted   Solitary pulmonary nodule 07/10/2018   Dysuria 07/08/2018   Morbid obesity due to excess calories (HCC) 06/25/2018   DOE (dyspnea on exertion) 06/20/2018   Chronic cough 06/20/2018   PALPITATIONS 06/12/2010   CHEST PAIN, PRECORDIAL 06/12/2010   Migraine headache 06/11/2010   IBS 06/11/2010    ALLERGIC RHINITIS 09/11/2009    Past Surgical History:  Procedure Laterality Date   BREAST EXCISIONAL BIOPSY Left    caesarean section     HYSTEROSCOPY WITH D & C  04/27/2011   Procedure: DILATATION AND CURETTAGE (D&C) /HYSTEROSCOPY;  Surgeon: Geryl Rankins, MD;  Location: [facility];  Service: Gynecology;  Laterality: N/A;   MELANOMA EXCISION     svd     x 1 - stillborn at 36 weeks    OB History     Gravida  2   Para  2   Term  2   Preterm      AB      Living  1      SAB      IAB      Ectopic      Multiple      Live Births  1            Home Medications    Prior to Admission medications   Medication Sig Start Date End Date Taking? Authorizing Provider  escitalopram (LEXAPRO) 20 MG tablet Take 20 mg by mouth daily.   Yes [provider]  gabapentin (NEURONTIN) 300 MG capsule TAKE 1  CAPSULE (300 MG TOTAL) BY MOUTH 4 (FOUR) TIMES DAILY. 10/06/18  Yes Nyoka Cowden, MD  ibuprofen (ADVIL) 800 MG tablet Take 800 mg by mouth 2 (two) times daily as needed.   Yes [provider]  loratadine (CLARITIN) 10 MG tablet Take 1 tablet by mouth daily. 11/10/22  Yes [provider]  meloxicam (MOBIC) 15 MG tablet Take 15 mg by mouth daily. 07/10/21  Yes [provider]  predniSONE (DELTASONE) 20 MG tablet Take 2 tablets (40 mg total) by mouth daily with breakfast for 5 days. 04/19/23 04/24/23 Yes Leath-Warren, Sadie Haber, NP  topiramate (TOPAMAX) 50 MG tablet Take 1 tablet by mouth daily.  03/07/18  Yes [provider]  traZODone (DESYREL) 50 MG tablet Take 1 tablet by mouth at bedtime. 05/31/17  Yes [provider]  diclofenac Sodium (VOLTAREN) 1 % GEL Apply 2 g topically 4 (four) times daily.    [provider]  FLUoxetine (PROZAC) 20 MG tablet Take 20 mg by mouth every morning. Patient not taking: Reported on 01/07/2023 07/16/14   [provider]  levonorgestrel (MIRENA) 20 MCG/DAY IUD by Intrauterine route.     [provider]  phentermine 37.5 MG capsule Take 37.5 mg by mouth every morning. 08/18/22   [provider]  WEGOVY 0.25 MG/0.5ML SOAJ Inject 0.5 mLs (0.25 mg dose) into the skin once a week at 0900 for 4 doses. Patient not taking: Reported on 01/07/2023 10/21/22       Family History Family History  Problem Relation Age of Onset   Emphysema Paternal Grandmother    Heart disease Paternal Grandmother    Emphysema Paternal Grandfather    Cancer Paternal Grandfather        throat cancer    Social History Social History   Tobacco Use   Smoking status: Never   Smokeless tobacco: Never  Vaping Use   Vaping status: Never Used  Substance Use Topics   Alcohol use: No   Drug use: No     Allergies   Penicillins, Amitriptyline, Cymbalta [duloxetine hcl], and Sulfonamide derivatives   Review of Systems Review of Systems Per HPI  Physical Exam Triage Vital Signs ED Triage Vitals [04/19/23 1830]  Encounter Vitals Group     BP 121/82     Systolic BP Percentile      Diastolic BP Percentile      Pulse Rate 70     Resp 14     Temp 97.9 F (36.6 C)     Temp Source Oral     SpO2 96 %     Weight      Height      Head Circumference      Peak Flow      Pain Score 7     Pain Loc      Pain Education      Exclude from Growth Chart    No data found.  Updated Vital Signs BP 121/82 (BP Location: Right Arm)   Pulse 70   Temp 97.9 F (36.6 C) (Oral)   Resp 14   SpO2 96%   Visual Acuity Right Eye Distance:   Left Eye Distance:   Bilateral Distance:    Right Eye Near:   Left Eye Near:    Bilateral Near:     Physical Exam Vitals and nursing note reviewed.  Constitutional:      General: She is not in acute distress.    Appearance: Normal appearance.  HENT:  Head: Normocephalic.  Eyes:     Extraocular Movements: Extraocular movements intact.     Pupils: Pupils are equal, round, and reactive to light.  Pulmonary:     Effort: Pulmonary effort is  normal.  Musculoskeletal:     Cervical back: Normal range of motion.     Left foot: Decreased range of motion. Normal capillary refill. Tenderness (Calcaneus) present. No swelling or deformity. Normal pulse.     Comments: Pain with weightbearing on heel.  Tenderness noted to the heel of the left foot, that radiates to the mid and forefoot.  There is no obvious bruising, swelling, deformity, or redness present.  Skin:    General: Skin is warm and dry.  Neurological:     General: No focal deficit present.     Mental Status: She is alert and oriented to person, place, and time.  Psychiatric:        Mood and Affect: Mood normal.        Behavior: Behavior normal.      UC Treatments / Results  Labs (all labs ordered are listed, but only abnormal results are displayed) Labs Reviewed - No data to display  EKG   Radiology No results found.  Procedures Procedures (including critical care time)  Medications Ordered in UC Medications - No data to display  Initial Impression / Assessment and Plan / UC Course  I have reviewed the triage vital signs and the nursing notes.  Pertinent labs & imaging results that were available during my care of the patient were reviewed by me and considered in my medical decision making (see chart for details).  Symptoms appear to be consistent with plantar fasciitis.  Patient has pain with attempting to weight-bear using her heel.  She also has tenderness noted to the heel of the left foot that radiates into the left lower extremity and to the mid left foot.  Will treat empirically with prednisone 40 mg to help with inflammation.  Supportive care recommendations were provided and discussed with the patient to include over-the-counter analgesics, RICE therapy, and using a tennis ball or frozen water bottle to massage the left foot.  Patient was advised if symptoms do not improve with this treatment, it is recommended that she follow-up with orthopedics for  further evaluation.  Patient was given information for [employer] and for [facility].  Patient is in agreement with this plan of care and verbalizes understanding.  All questions were answered.  Patient stable for discharge.  Work note was provided.   Final Clinical Impressions(s) / UC Diagnoses   Final diagnoses:  Plantar fasciitis of left foot     Discharge Instructions      Take medication as prescribed.  As discussed, I would like for you to follow-up with your neurologist to ensure there are no contraindications to starting a steroid. May take over-the-counter Tylenol arthritis strength 650 mg tablets as directed. I provided exercises for you to perform at home.  Try to perform the exercises at least 2-3 times daily. Freeze a water bottle or use a tennis ball to roll under the foot while symptoms persist. Make sure you are wearing shoes with good insole and support while symptoms persist. As discussed, if symptoms do not improve with this treatment, would like for you to follow-up with orthopedics for further evaluation. Follow-up as needed.     ED Prescriptions     Medication Sig Dispense Auth. Provider   predniSONE (DELTASONE) 20 MG tablet Take 2 tablets (40  mg total) by mouth daily with breakfast for 5 days. 10 tablet Leath-Warren, Sadie Haber, NP      PDMP not reviewed this encounter.   Abran Cantor, NP 04/19/23 1856

## 2023-04-19 NOTE — ED Triage Notes (Signed)
Pt presents with left heel pain that started today. No recent falls or injury. Taking ibuprofen.

## 2023-06-25 DIAGNOSIS — M47816 Spondylosis without myelopathy or radiculopathy, lumbar region: Secondary | ICD-10-CM | POA: Insufficient documentation

## 2023-06-25 DIAGNOSIS — M79672 Pain in left foot: Secondary | ICD-10-CM | POA: Insufficient documentation

## 2023-07-28 ENCOUNTER — Ambulatory Visit: Payer: BLUE CROSS/BLUE SHIELD | Admitting: Podiatry

## 2023-07-28 ENCOUNTER — Ambulatory Visit (INDEPENDENT_AMBULATORY_CARE_PROVIDER_SITE_OTHER): Payer: BLUE CROSS/BLUE SHIELD

## 2023-07-28 ENCOUNTER — Telehealth: Payer: Self-pay | Admitting: Podiatry

## 2023-07-28 DIAGNOSIS — Z01818 Encounter for other preprocedural examination: Secondary | ICD-10-CM | POA: Diagnosis not present

## 2023-07-28 DIAGNOSIS — M722 Plantar fascial fibromatosis: Secondary | ICD-10-CM | POA: Diagnosis not present

## 2023-07-28 DIAGNOSIS — M7662 Achilles tendinitis, left leg: Secondary | ICD-10-CM

## 2023-07-28 MED ORDER — MELOXICAM 15 MG PO TABS: 15.0000 mg | ORAL_TABLET | Freq: Every day | ORAL | 0 refills | Status: DC

## 2023-07-28 MED ORDER — METHYLPREDNISOLONE 4 MG PO TBPK: ORAL_TABLET | ORAL | 0 refills | Status: DC

## 2023-07-28 NOTE — Telephone Encounter (Signed)
Left a message to the patient that the medication was sent to the pharmacy.

## 2023-07-28 NOTE — Telephone Encounter (Signed)
Patient called and requested the oral steroid and meloxicam be sent to [facility] in [place], [place]. Thank you.

## 2023-07-28 NOTE — Progress Notes (Signed)
Subjective:  Patient ID: [name], female    DOB: [birth year],  MRN: [id number]  Chief Complaint  Patient presents with   Heel Spurs    Left foot heel pain     43 y.o. female presents with the above complaint. Patient presents with complaint of left Achilles tendon insertional pain that has been on for quite some time.  She also has left plantar fasciitis pain as well.  She is being treated by another physician/podiatrist with injection cam boot immobilization all conservative care at this time she is here for further management/surgical consideration.  Patient already has MRI done as well.  She denies any other acute complaint pain scale 7 out of 10 dull aching nature wishes to discuss surgical options at this time   Review of Systems: Negative except as noted in the HPI. Denies N/V/F/Ch.  Past Medical History:  Diagnosis Date   Abnormal Pap smear of cervix    Acne fulminans    Allergic rhinitis    Brain cyst    Breast nodule    Cervical polyp    Epilepsy (HCC)    Fibromyalgia    GERD (gastroesophageal reflux disease)    IBS (irritable bowel syndrome)    Kidney stones    Melanoma of hip (HCC)    Menorrhagia    Migraine headache    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HCC)    Previous stillbirth or demise, antepartum    Pulmonary nodule    previous evaluation - benign   Seizures (HCC)    recently diagnosed with epilepsy   Sepsis(995.91)    Shortness of breath    due to wt   Small bowel obstruction (HCC)    Staphylococcal infection     Current Outpatient Medications:    diclofenac Sodium (VOLTAREN) 1 % GEL, Apply 2 g topically 4 (four) times daily., Disp: , Rfl:    escitalopram (LEXAPRO) 20 MG tablet, Take 20 mg by mouth daily., Disp: , Rfl:    FLUoxetine (PROZAC) 20 MG tablet, Take 20 mg by mouth every morning. (Patient not taking: Reported on 01/07/2023), Disp: , Rfl: 6   gabapentin (NEURONTIN) 300 MG capsule, TAKE 1 CAPSULE (300 MG TOTAL) BY MOUTH 4 (FOUR) TIMES DAILY., Disp:  360 capsule, Rfl: 1   ibuprofen (ADVIL) 800 MG tablet, Take 800 mg by mouth 2 (two) times daily as needed., Disp: , Rfl:    levonorgestrel (MIRENA) 20 MCG/DAY IUD, by Intrauterine route., Disp: , Rfl:    loratadine (CLARITIN) 10 MG tablet, Take 1 tablet by mouth daily., Disp: , Rfl:    meloxicam (MOBIC) 15 MG tablet, Take 15 mg by mouth daily., Disp: , Rfl:    meloxicam (MOBIC) 15 MG tablet, Take 1 tablet (15 mg total) by mouth daily., Disp: 30 tablet, Rfl: 0   methylPREDNISolone (MEDROL DOSEPAK) 4 MG TBPK tablet, Take as directed, Disp: 21 each, Rfl: 0   phentermine 37.5 MG capsule, Take 37.5 mg by mouth every morning., Disp: , Rfl:    topiramate (TOPAMAX) 50 MG tablet, Take 1 tablet by mouth daily. , Disp: , Rfl:    traZODone (DESYREL) 50 MG tablet, Take 1 tablet by mouth at bedtime., Disp: , Rfl:    WEGOVY 0.25 MG/0.5ML SOAJ, Inject 0.5 mLs (0.25 mg dose) into the skin once a week at 0900 for 4 doses. (Patient not taking: Reported on 01/07/2023), Disp: 2 mL, Rfl: 0  Social History   Tobacco Use  Smoking Status Never  Smokeless Tobacco Never  Allergies  Allergen Reactions   Penicillins Other (See Comments)   Amitriptyline Nausea And Vomiting   Cymbalta [Duloxetine Hcl]     Rectal bleeding   Sulfonamide Derivatives Hives    Tremors, red blotches   Objective:  There were no vitals filed for this visit. There is no height or weight on file to calculate BMI. Constitutional Well developed. Well nourished.  Vascular Dorsalis pedis pulses palpable bilaterally. Posterior tibial pulses palpable bilaterally. Capillary refill normal to all digits.  No cyanosis or clubbing noted. Pedal hair growth normal.  Neurologic Normal speech. Oriented to person, place, and time. Epicritic sensation to light touch grossly present bilaterally.  Dermatologic Nails well groomed and normal in appearance. No open wounds. No skin lesions.  Orthopedic: Pain on palpation left Achilles tendon  insertional positive signal Silfverskiold test noted with gastrocnemius equinus positive Haglund's deformity noted.  Pain on palpation calcaneal tuber pain with dorsiflexion of the toes.   Radiographs: 3 views of skeletally mature adult left foot: Plantar and posterior heel spurring noted.  Subtalar joint arthritis noted.  No other bony abnormalities identified.  MRI was reviewed with the patient which shows some micro tearing along the Achilles tendon mild in nature.  Posterior spurring noted.  Slight thickening of the Achilles tendon noted.  Plantar spurring noted symptoms of plantar fasciitis consistent with inflammation around the bone spur Assessment:   1. Plantar fasciitis of left foot   2. Encounter for preoperative examination for general surgical procedure   3. Achilles tendinitis of left lower extremity    Plan:  Patient was evaluated and treated and all questions answered.  Left Achilles tendinitis with underlying ankle equinus and Haglund's deformity and left plantar fasciitis -All questions and concerns were discussed with the patient in extensive detail given that she has failed all conservative care including multiple steroid injection cam boot immobilization with physical therapy I believe she would benefit from surgical intervention at this time -I discussed that she would benefit from left Achilles tendon repair with detachment and reattachment of the Achilles tendon with resection of Haglund's deformity and gastroc recession with endoscopic plantar fasciotomy.  I discussed with her that there is no guarantees to the outcome of the procedure.  She will be nonweightbearing to the left lower extremity for 3 weeks followed by weightbearing as tolerated in cam boot. -Informed surgical risk consent was reviewed and read aloud to the patient.  I reviewed the films.  I have discussed my findings with the patient in great detail.  I have discussed all risks including but not limited to  infection, stiffness, scarring, limp, disability, deformity, damage to blood vessels and nerves, numbness, poor healing, need for braces, arthritis, chronic pain, amputation, death.  All benefits and realistic expectations discussed in great detail.  I have made no promises as to the outcome.  I have provided realistic expectations.  I have offered the patient a 2nd opinion, which they have declined and assured me they preferred to proceed despite the risks   No follow-ups on file.   Left achilles tennditis CAM boot surgery for halguand  gastroc   Left endoscopic plantar fasciotomy

## 2023-08-06 ENCOUNTER — Telehealth: Payer: Self-pay | Admitting: Podiatry

## 2023-08-06 NOTE — Telephone Encounter (Signed)
DOS-09/20/23  EPF [id number] GASTROCNEMIUS RECESS [id number] CALCANEAL OSTECTOMY [id number] REPAIR TORN ACHILLES TENDON [id number]  [employer] EFFECTIVE DATE-  06/16/23  SPOKE WITH MARILYN I FROM [employer] AND SHE STATED THAT PRIOR AUTH IS NOT REQUIRED FOR CPT CODES [phone] AND [id number].  CALL REF #: MARILYN I 08/06/23 @9 :10AM EST

## 2023-08-23 ENCOUNTER — Telehealth: Payer: Self-pay

## 2023-08-23 NOTE — Telephone Encounter (Signed)
 Patient called stating that she is having left leg pain that is keeping her up and causing her to be restless. She would like to know if there is anything she can take or any cream that she can use to ease the pain so she can get some type of rest at night.

## 2023-08-24 ENCOUNTER — Other Ambulatory Visit: Payer: Self-pay | Admitting: Podiatry

## 2023-08-24 MED ORDER — CYCLOBENZAPRINE HCL 10 MG PO TABS: 10.0000 mg | ORAL_TABLET | Freq: Three times a day (TID) | ORAL | 0 refills | Status: AC | PRN

## 2023-08-27 ENCOUNTER — Telehealth: Payer: Self-pay | Admitting: Podiatry

## 2023-08-27 NOTE — Telephone Encounter (Signed)
 [facility] in [place], [place] called to request office notes from 07/28/2023. Notes printed out and faxed over [phone].

## 2023-09-20 ENCOUNTER — Other Ambulatory Visit: Payer: Self-pay | Admitting: Podiatry

## 2023-09-20 DIAGNOSIS — M216X2 Other acquired deformities of left foot: Secondary | ICD-10-CM | POA: Diagnosis not present

## 2023-09-20 DIAGNOSIS — M722 Plantar fascial fibromatosis: Secondary | ICD-10-CM | POA: Diagnosis not present

## 2023-09-20 DIAGNOSIS — M2022 Hallux rigidus, left foot: Secondary | ICD-10-CM | POA: Diagnosis not present

## 2023-09-20 DIAGNOSIS — M7662 Achilles tendinitis, left leg: Secondary | ICD-10-CM | POA: Diagnosis not present

## 2023-09-20 MED ORDER — OXYCODONE-ACETAMINOPHEN 5-325 MG PO TABS: 1.0000 | ORAL_TABLET | ORAL | 0 refills | Status: AC | PRN

## 2023-09-20 MED ORDER — IBUPROFEN 800 MG PO TABS: 800.0000 mg | ORAL_TABLET | Freq: Four times a day (QID) | ORAL | 1 refills | Status: DC | PRN

## 2023-09-29 ENCOUNTER — Ambulatory Visit (INDEPENDENT_AMBULATORY_CARE_PROVIDER_SITE_OTHER): Payer: BLUE CROSS/BLUE SHIELD | Admitting: Podiatry

## 2023-09-29 DIAGNOSIS — M722 Plantar fascial fibromatosis: Secondary | ICD-10-CM

## 2023-09-29 DIAGNOSIS — Z9889 Other specified postprocedural states: Secondary | ICD-10-CM

## 2023-09-29 DIAGNOSIS — M7662 Achilles tendinitis, left leg: Secondary | ICD-10-CM

## 2023-09-29 NOTE — Progress Notes (Signed)
 Subjective:  Patient ID: [name], female    DOB: 20-Jun-1980,  MRN: [id number]  Chief Complaint  Patient presents with   Routine Post Op    DOS 09/20/23 --- LEFT GASTROCNEMIUS RECESS WITH ACHILLES REPAIR WITH HAGLAND RESECTION WITH FIXATION WITH EPF    DOS: 09/20/2023 Procedure: Left gastrocnemius recession with Achilles repair with Haglund's resection and endoscopic plantar fasciotomy  43 y.o. female returns for post-op check.  Patient states that she is doing well minimal complaints nonweightbearing to the left lower extremity.  Denies nausea fever chills vomiting.  Bandages clean dry and intact  Review of Systems: Negative except as noted in the HPI. Denies N/V/F/Ch.  Past Medical History:  Diagnosis Date   Abnormal Pap smear of cervix    Acne fulminans    Allergic rhinitis    Brain cyst    Breast nodule    Cervical polyp    Epilepsy (HCC)    Fibromyalgia    GERD (gastroesophageal reflux disease)    IBS (irritable bowel syndrome)    Kidney stones    Melanoma of hip (HCC)    Menorrhagia    Migraine headache    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HCC)    Previous stillbirth or demise, antepartum    Pulmonary nodule    previous evaluation - benign   Seizures (HCC)    recently diagnosed with epilepsy   Sepsis(995.91)    Shortness of breath    due to wt   Small bowel obstruction (HCC)    Staphylococcal infection     Current Outpatient Medications:    benzonatate (TESSALON) 100 MG capsule, Take 100 mg by mouth 3 (three) times daily as needed., Disp: , Rfl:    diclofenac (CATAFLAM) 50 MG tablet, Take by mouth., Disp: , Rfl:    drospirenone-ethinyl estradiol (VESTURA) 3-0.02 MG tablet, , Disp: , Rfl:    LAGEVRIO 200 MG CAPS capsule, SMARTSIG:4 Capsule(s) By Mouth Every 12 Hours, Disp: , Rfl:    oseltamivir (TAMIFLU) 75 MG capsule, Take 75 mg by mouth 2 (two) times daily., Disp: , Rfl:    promethazine-dextromethorphan (PROMETHAZINE-DM) 6.25-15 MG/5ML syrup, Take 5 mLs by mouth at  bedtime as needed., Disp: , Rfl:    tiZANidine (ZANAFLEX) 4 MG tablet, TAKE ONE TABLET (4 MG DOSE) BY MOUTH 2 (TWO) TIMES A DAY AS NEEDED FOR UP TO 30 DAYS., Disp: , Rfl:    cyclobenzaprine (FLEXERIL) 10 MG tablet, Take 1 tablet (10 mg total) by mouth 3 (three) times daily as needed for muscle spasms., Disp: 30 tablet, Rfl: 0   diclofenac Sodium (VOLTAREN) 1 % GEL, Apply 2 g topically 4 (four) times daily., Disp: , Rfl:    escitalopram (LEXAPRO) 20 MG tablet, Take 20 mg by mouth daily., Disp: , Rfl:    FLUoxetine (PROZAC) 20 MG tablet, Take 20 mg by mouth every morning. (Patient not taking: Reported on 01/07/2023), Disp: , Rfl: 6   gabapentin (NEURONTIN) 300 MG capsule, TAKE 1 CAPSULE (300 MG TOTAL) BY MOUTH 4 (FOUR) TIMES DAILY., Disp: 360 capsule, Rfl: 1   ibuprofen (ADVIL) 800 MG tablet, Take 800 mg by mouth 2 (two) times daily as needed., Disp: , Rfl:    ibuprofen (ADVIL) 800 MG tablet, Take 1 tablet (800 mg total) by mouth every 6 (six) hours as needed., Disp: 60 tablet, Rfl: 1   levonorgestrel (MIRENA) 20 MCG/DAY IUD, by Intrauterine route., Disp: , Rfl:    loratadine (CLARITIN) 10 MG tablet, Take 1 tablet by mouth daily., Disp: ,  Rfl:    meloxicam (MOBIC) 15 MG tablet, Take 15 mg by mouth daily., Disp: , Rfl:    meloxicam (MOBIC) 15 MG tablet, TAKE 1 TABLET (15 MG TOTAL) BY MOUTH DAILY., Disp: 30 tablet, Rfl: 0   methylPREDNISolone (MEDROL DOSEPAK) 4 MG TBPK tablet, Take as directed, Disp: 21 each, Rfl: 0   oxyCODONE-acetaminophen (PERCOCET) 5-325 MG tablet, Take 1 tablet by mouth every 4 (four) hours as needed for severe pain (pain score 7-10)., Disp: 30 tablet, Rfl: 0   phentermine 37.5 MG capsule, Take 37.5 mg by mouth every morning., Disp: , Rfl:    topiramate (TOPAMAX) 50 MG tablet, Take 1 tablet by mouth daily. , Disp: , Rfl:    traZODone (DESYREL) 50 MG tablet, Take 1 tablet by mouth at bedtime., Disp: , Rfl:    WEGOVY 0.25 MG/0.5ML SOAJ, Inject 0.5 mLs (0.25 mg dose) into the skin  once a week at 0900 for 4 doses. (Patient not taking: Reported on 01/07/2023), Disp: 2 mL, Rfl: 0   zonisamide (ZONEGRAN) 50 MG capsule, Take 100 mg by mouth 2 (two) times daily., Disp: , Rfl:   Social History   Tobacco Use  Smoking Status Never  Smokeless Tobacco Never    Allergies  Allergen Reactions   Duloxetine     Other Reaction(s): Other  Rectal bleeding    Bleeding  Rectal bleeding  Bleeding    Rectal bleeding  Bleeding  Rectal bleeding  Bleeding   Penicillins Other (See Comments)   Sulfonamide Derivatives Hives and Swelling    Other Reaction(s): Other   Amitriptyline Nausea And Vomiting   Cymbalta [Duloxetine Hcl]     Rectal bleeding   Objective:  There were no vitals filed for this visit. There is no height or weight on file to calculate BMI. Constitutional Well developed. Well nourished.  Vascular Foot warm and well perfused. Capillary refill normal to all digits.   Neurologic Normal speech. Oriented to person, place, and time. Epicritic sensation to light touch grossly present bilaterally.  Dermatologic Skin healing well without signs of infection. Skin edges well coapted without signs of infection.  Orthopedic: Tenderness to palpation noted about the surgical site.   Radiographs: None Assessment:   1. Plantar fasciitis of left foot   2. Achilles tendinitis of left lower extremity   3. S/P foot surgery    Plan:  Patient was evaluated and treated and all questions answered.  S/p foot surgery left -Progressing as expected post-operatively. -XR: See above -WB Status: Nonweightbearing in left lower extremity -Sutures: Intact.  No signs of dehiscence noted no complication noted -Medications: None -Foot redressed.  No follow-ups on file.

## 2023-09-30 ENCOUNTER — Other Ambulatory Visit: Payer: Self-pay | Admitting: Podiatry

## 2023-09-30 ENCOUNTER — Encounter: Payer: Self-pay | Admitting: Podiatry

## 2023-09-30 ENCOUNTER — Telehealth: Payer: Self-pay | Admitting: Urology

## 2023-09-30 MED ORDER — OXYCODONE-ACETAMINOPHEN 5-325 MG PO TABS: 1.0000 | ORAL_TABLET | ORAL | 0 refills | Status: AC | PRN

## 2023-09-30 MED ORDER — KETOROLAC TROMETHAMINE 10 MG PO TABS: 10.0000 mg | ORAL_TABLET | Freq: Four times a day (QID) | ORAL | 0 refills | Status: AC | PRN

## 2023-09-30 MED ORDER — OXYCODONE-ACETAMINOPHEN 5-325 MG PO TABS: 1.0000 | ORAL_TABLET | ORAL | 0 refills | Status: DC | PRN

## 2023-09-30 NOTE — Addendum Note (Signed)
 Addended by: Sabin Gibeault on: 09/30/2023 02:59 PM   Modules accepted: Orders

## 2023-09-30 NOTE — Telephone Encounter (Signed)
 Pt called saying her medication wasn't sent to the pharmacy yesterday when she was seen in the office.   oxyCODONE-acetaminophen (PERCOCET) 5-325 MG    [id number] - [place], [place] - [address]

## 2023-09-30 NOTE — Telephone Encounter (Signed)
Patient is aware and did not have any other questions.

## 2023-10-05 ENCOUNTER — Telehealth: Payer: Self-pay

## 2023-10-05 MED ORDER — OXYCODONE-ACETAMINOPHEN 5-325 MG PO TABS: 1.0000 | ORAL_TABLET | ORAL | 0 refills | Status: AC | PRN

## 2023-10-05 NOTE — Telephone Encounter (Signed)
 Patient called stating that she was told that she has to call every 7 days to get a refill on her pain medication due to insurance. She states that she needs a refill on her pain medication.

## 2023-10-06 ENCOUNTER — Telehealth: Payer: Self-pay

## 2023-10-06 NOTE — Telephone Encounter (Signed)
 PA request received from covermymeds for oxycodone /acetaminophen  5/325mg  tablet. PA submitted and waiting on reply

## 2023-10-07 NOTE — Telephone Encounter (Signed)
 PA approved.

## 2023-10-13 ENCOUNTER — Encounter: Payer: BLUE CROSS/BLUE SHIELD | Admitting: Podiatry

## 2023-10-15 ENCOUNTER — Encounter: Payer: Self-pay | Admitting: Podiatry

## 2023-10-15 ENCOUNTER — Ambulatory Visit (INDEPENDENT_AMBULATORY_CARE_PROVIDER_SITE_OTHER): Admitting: Podiatry

## 2023-10-15 DIAGNOSIS — Z9889 Other specified postprocedural states: Secondary | ICD-10-CM

## 2023-10-15 DIAGNOSIS — M722 Plantar fascial fibromatosis: Secondary | ICD-10-CM

## 2023-10-15 NOTE — Progress Notes (Unsigned)
 Subjective:  Patient ID: [name], female    DOB: [birth year],  MRN: [id number]  Chief Complaint  Patient presents with   Post-op Problem    Patient states everything has been ok since last visit not really a lot of pain     DOS: 09/20/2023 Procedure: Left gastrocnemius recession with Achilles repair with Haglund's resection and endoscopic plantar fasciotomy  43 y.o. female returns for post-op check.  Patient states that she is doing well minimal complaints nonweightbearing to the left lower extremity.  Denies nausea fever chills vomiting.  Bandages clean dry and intact  Review of Systems: Negative except as noted in the HPI. Denies N/V/F/Ch.  Past Medical History:  Diagnosis Date   Abnormal Pap smear of cervix    Acne fulminans    Allergic rhinitis    Brain cyst    Breast nodule    Cervical polyp    Epilepsy (HCC)    Fibromyalgia    GERD (gastroesophageal reflux disease)    IBS (irritable bowel syndrome)    Kidney stones    Melanoma of hip (HCC)    Menorrhagia    Migraine headache    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HCC)    Previous stillbirth or demise, antepartum    Pulmonary nodule    previous evaluation - benign   Seizures (HCC)    recently diagnosed with epilepsy   Sepsis(995.91)    Shortness of breath    due to wt   Small bowel obstruction (HCC)    Staphylococcal infection     Current Outpatient Medications:    benzonatate (TESSALON) 100 MG capsule, Take 100 mg by mouth 3 (three) times daily as needed., Disp: , Rfl:    cyclobenzaprine  (FLEXERIL ) 10 MG tablet, Take 1 tablet (10 mg total) by mouth 3 (three) times daily as needed for muscle spasms., Disp: 30 tablet, Rfl: 0   diclofenac (CATAFLAM) 50 MG tablet, Take by mouth., Disp: , Rfl:    diclofenac Sodium (VOLTAREN) 1 % GEL, Apply 2 g topically 4 (four) times daily., Disp: , Rfl:    drospirenone-ethinyl estradiol (VESTURA) 3-0.02 MG tablet, , Disp: , Rfl:    escitalopram (LEXAPRO) 20 MG tablet, Take 20 mg by mouth  daily., Disp: , Rfl:    FLUoxetine (PROZAC) 20 MG tablet, Take 20 mg by mouth every morning., Disp: , Rfl: 6   gabapentin  (NEURONTIN ) 300 MG capsule, TAKE 1 CAPSULE (300 MG TOTAL) BY MOUTH 4 (FOUR) TIMES DAILY., Disp: 360 capsule, Rfl: 1   ibuprofen  (ADVIL ) 800 MG tablet, Take 800 mg by mouth 2 (two) times daily as needed., Disp: , Rfl:    ibuprofen  (ADVIL ) 800 MG tablet, Take 1 tablet (800 mg total) by mouth every 6 (six) hours as needed., Disp: 60 tablet, Rfl: 1   ketorolac  (TORADOL ) 10 MG tablet, Take 1 tablet (10 mg total) by mouth every 6 (six) hours as needed., Disp: 20 tablet, Rfl: 0   LAGEVRIO 200 MG CAPS capsule, SMARTSIG:4 Capsule(s) By Mouth Every 12 Hours, Disp: , Rfl:    levonorgestrel (MIRENA) 20 MCG/DAY IUD, by Intrauterine route., Disp: , Rfl:    loratadine (CLARITIN) 10 MG tablet, Take 1 tablet by mouth daily., Disp: , Rfl:    meloxicam  (MOBIC ) 15 MG tablet, Take 15 mg by mouth daily., Disp: , Rfl:    meloxicam  (MOBIC ) 15 MG tablet, TAKE 1 TABLET (15 MG TOTAL) BY MOUTH DAILY., Disp: 30 tablet, Rfl: 0   methylPREDNISolone  (MEDROL  DOSEPAK) 4 MG TBPK tablet, Take as directed,  Disp: 21 each, Rfl: 0   oseltamivir (TAMIFLU) 75 MG capsule, Take 75 mg by mouth 2 (two) times daily., Disp: , Rfl:    oxyCODONE -acetaminophen  (PERCOCET) 5-325 MG tablet, Take 1 tablet by mouth every 4 (four) hours as needed for severe pain (pain score 7-10)., Disp: 30 tablet, Rfl: 0   oxyCODONE -acetaminophen  (PERCOCET) 5-325 MG tablet, Take 1 tablet by mouth every 4 (four) hours as needed for severe pain (pain score 7-10)., Disp: 30 tablet, Rfl: 0   phentermine 37.5 MG capsule, Take 37.5 mg by mouth every morning., Disp: , Rfl:    promethazine -dextromethorphan (PROMETHAZINE -DM) 6.25-15 MG/5ML syrup, Take 5 mLs by mouth at bedtime as needed., Disp: , Rfl:    tiZANidine (ZANAFLEX) 4 MG tablet, TAKE ONE TABLET (4 MG DOSE) BY MOUTH 2 (TWO) TIMES A DAY AS NEEDED FOR UP TO 30 DAYS., Disp: , Rfl:    topiramate  (TOPAMAX) 50 MG tablet, Take 1 tablet by mouth daily. , Disp: , Rfl:    traZODone (DESYREL) 50 MG tablet, Take 1 tablet by mouth at bedtime., Disp: , Rfl:    WEGOVY  0.25 MG/0.5ML SOAJ, Inject 0.5 mLs (0.25 mg dose) into the skin once a week at 0900 for 4 doses., Disp: 2 mL, Rfl: 0   zonisamide (ZONEGRAN) 50 MG capsule, Take 100 mg by mouth 2 (two) times daily., Disp: , Rfl:    ketorolac  (TORADOL ) 10 MG tablet, Take 1 tablet (10 mg total) by mouth every 6 (six) hours as needed., Disp: 20 tablet, Rfl: 0  Social History   Tobacco Use  Smoking Status Never  Smokeless Tobacco Never    Allergies  Allergen Reactions   Duloxetine     Other Reaction(s): Other  Rectal bleeding    Bleeding  Rectal bleeding  Bleeding    Rectal bleeding  Bleeding  Rectal bleeding  Bleeding   Penicillins Other (See Comments)   Sulfonamide Derivatives Hives and Swelling    Other Reaction(s): Other   Amitriptyline Nausea And Vomiting   Cymbalta [Duloxetine Hcl]     Rectal bleeding   Objective:  There were no vitals filed for this visit. There is no height or weight on file to calculate BMI. Constitutional Well developed. Well nourished.  Vascular Foot warm and well perfused. Capillary refill normal to all digits.   Neurologic Normal speech. Oriented to person, place, and time. Epicritic sensation to light touch grossly present bilaterally.  Dermatologic Skin completely epithelialized.  No signs of dehiscence noted no complication noted  Orthopedic: Tenderness to palpation noted about the surgical site.   Radiographs: None Assessment:   No diagnosis found.  Plan:  Patient was evaluated and treated and all questions answered.  S/p foot surgery left -Progressing as expected post-operatively. -XR: See above -WB Status: Slowly begin weightbearing as tolerated left lower extremity with Cam boot -Sutures: Removed no signs of dehiscence noted no complication noted -Medications: None - We will  start sickle therapy as well physical therapy order was placed  No follow-ups on file.

## 2023-10-18 NOTE — Telephone Encounter (Signed)
 pt left mess about forms. I called her back and she said they faxed 09/28/23?? no forms recd. I gave her my fax and she will see if they can fax to me today and I will get back to them today.I adv I would call her once I get them

## 2023-10-19 ENCOUNTER — Other Ambulatory Visit: Payer: Self-pay | Admitting: Podiatry

## 2023-10-19 DIAGNOSIS — Z9889 Other specified postprocedural states: Secondary | ICD-10-CM

## 2023-10-19 DIAGNOSIS — M7662 Achilles tendinitis, left leg: Secondary | ICD-10-CM

## 2023-10-19 DIAGNOSIS — M722 Plantar fascial fibromatosis: Secondary | ICD-10-CM

## 2023-10-19 MED ORDER — KETOROLAC TROMETHAMINE 10 MG PO TABS: 10.0000 mg | ORAL_TABLET | Freq: Four times a day (QID) | ORAL | 0 refills | Status: DC | PRN

## 2023-10-25 DIAGNOSIS — Z0271 Encounter for disability determination: Secondary | ICD-10-CM

## 2023-10-25 NOTE — Telephone Encounter (Signed)
 confirmed with pt date she went out of work-09/17/23. Adv [employer] forms/notes being faxed to [phone] and her RTW date is 11/22/23

## 2023-10-26 ENCOUNTER — Ambulatory Visit: Attending: Podiatry

## 2023-10-26 DIAGNOSIS — M25572 Pain in left ankle and joints of left foot: Secondary | ICD-10-CM | POA: Diagnosis present

## 2023-10-26 DIAGNOSIS — M722 Plantar fascial fibromatosis: Secondary | ICD-10-CM | POA: Diagnosis not present

## 2023-10-26 DIAGNOSIS — M25672 Stiffness of left ankle, not elsewhere classified: Secondary | ICD-10-CM | POA: Diagnosis present

## 2023-10-26 DIAGNOSIS — M7662 Achilles tendinitis, left leg: Secondary | ICD-10-CM | POA: Insufficient documentation

## 2023-10-26 DIAGNOSIS — Z9889 Other specified postprocedural states: Secondary | ICD-10-CM | POA: Insufficient documentation

## 2023-10-26 NOTE — Therapy (Signed)
 OUTPATIENT PHYSICAL THERAPY LOWER EXTREMITY EVALUATION   Patient Name: [name] MRN: [id number] DOB:July 28, 1980, 43 y.o., female Today's Date: 10/26/2023  END OF SESSION:  PT End of Session - 10/26/23 1321     Visit Number 1    Number of Visits 16    Date for PT Re-Evaluation 01/07/24    PT Start Time 1347    PT Stop Time 1430    PT Time Calculation (min) 43 min    Activity Tolerance Patient tolerated treatment well    Behavior During Therapy WFL for tasks assessed/performed             Past Medical History:  Diagnosis Date   Abnormal Pap smear of cervix    Acne fulminans    Allergic rhinitis    Brain cyst    Breast nodule    Cervical polyp    Epilepsy (HCC)    Fibromyalgia    GERD (gastroesophageal reflux disease)    IBS (irritable bowel syndrome)    Kidney stones    Melanoma of hip (HCC)    Menorrhagia    Migraine headache    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HCC)    Previous stillbirth or demise, antepartum    Pulmonary nodule    previous evaluation - benign   Seizures (HCC)    recently diagnosed with epilepsy   Sepsis(995.91)    Shortness of breath    due to wt   Small bowel obstruction (HCC)    Staphylococcal infection    Past Surgical History:  Procedure Laterality Date   BREAST EXCISIONAL BIOPSY Left    caesarean section     HYSTEROSCOPY WITH D & C  04/27/2011   Procedure: DILATATION AND CURETTAGE (D&C) /HYSTEROSCOPY;  Surgeon: Johnn Najjar, MD;  Location: [facility];  Service: Gynecology;  Laterality: N/A;   MELANOMA EXCISION     svd     x 1 - stillborn at 82 weeks   Patient Active Problem List   Diagnosis Date Noted   Solitary pulmonary nodule 07/10/2018   Dysuria 07/08/2018   Morbid obesity due to excess calories (HCC) 06/25/2018   DOE (dyspnea on exertion) 06/20/2018   Chronic cough 06/20/2018   PALPITATIONS 06/12/2010   CHEST PAIN, PRECORDIAL 06/12/2010   Migraine headache 06/11/2010   IBS 06/11/2010   ALLERGIC RHINITIS 09/11/2009    PCP:  Artie Laster, FNP  REFERRING PROVIDER: Velma Ghazi, DPM   REFERRING DIAG: Plantar fasciitis of left foot, Achilles tendinitis of left lower extremity, S/P foot surgery   THERAPY DIAG:  Pain in left ankle and joints of left foot  Stiffness of left ankle, not elsewhere classified  Rationale for Evaluation and Treatment: Rehabilitation  ONSET DATE: 09/20/23  SUBJECTIVE:   SUBJECTIVE STATEMENT: Patient reports that she had surgery on 09/20/23.Her foot is still very sensitive and her ankle feels "locked." She has been in a CAM boot since last November. She has just begun to walk on her foot (in the CAM boot) in past week. She cannot wear a shoe or have anything touch the back of her heel yet.   PERTINENT HISTORY: Epilepsy, fibromyalgia, history of seizures, chronic low back pain, and anxiety PAIN:  Are you having pain? Yes: NPRS scale: Current: 5-6/10 Best: 5-6/10 Worst: 10/10 Pain location: posterior ankle and plantar surface of the foot Pain description: constant dull  Aggravating factors: walking (30-45 minutes) anything touching her heel Relieving factors: heat  PRECAUTIONS: Other: Ankle  RED FLAGS: None   WEIGHT BEARING RESTRICTIONS:  Yes WBAT  FALLS:  Has patient fallen in last 6 months? 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  PATIENT SURVEYS:  LEFS 18/80  COGNITION: Overall cognitive status: Within functional limits for tasks assessed     SENSATION: Patient reports no numbness or tingling  EDEMA:  Figure 8: Left: 58.5 cm Right:  57.5 cm   PALPATION: TTP: left gastroc/soleus, medial and lateral malleolus, plantar fascia  JOINT MOBILITY:   Left foot and ankle: hypomobile and painful   LOWER EXTREMITY ROM:  Active ROM Right eval Left eval  Hip flexion    Hip extension    Hip abduction    Hip adduction    Hip internal rotation    Hip external rotation    Knee flexion    Knee extension    Ankle dorsiflexion 5 -5  Ankle plantarflexion 65 25  Ankle inversion 43 11  Ankle eversion 28 5   (Blank rows = not tested)  LOWER EXTREMITY MMT: not tested due to surgical condition  MMT Right eval Left eval  Hip flexion    Hip extension    Hip abduction    Hip adduction    Hip internal rotation    Hip external rotation    Knee flexion    Knee extension    Ankle dorsiflexion    Ankle plantarflexion    Ankle inversion    Ankle eversion     (Blank rows = not tested)  GAIT: Assistive device utilized: Environmental consultant - 4 wheeled Level of assistance: Modified independence Comments: WBAT with CAM boot                                                                                                                                 TREATMENT DATE:     PATIENT EDUCATION:  Education details: Objective findings, prognosis, healing, plan of care, and goals for physical therapy Person educated: Patient Education method: Explanation Education comprehension: verbalized understanding  HOME EXERCISE PROGRAM:   ASSESSMENT:  CLINICAL IMPRESSION: Patient is a 43 y.o. female who was seen today for physical therapy evaluation and treatment for left ankle and foot pain and stiffness following a left gastrocnemius recession with Achilles repair with Haglund's resection and endoscopic plantar fasciotomy on 09/20/23.  She presented with moderate to high pain severity and irritability with palpation to her left foot and ankle reproducing her familiar symptoms.  Recommend that she continue with skilled physical therapy to address her  impairments to safely return to her prior level of function.  OBJECTIVE IMPAIRMENTS: Abnormal gait, decreased  activity tolerance, decreased mobility, difficulty walking, decreased ROM, decreased strength, hypomobility, increased edema, impaired flexibility, impaired sensation, impaired tone, and pain.   ACTIVITY LIMITATIONS: carrying, lifting, standing, squatting, sleeping, stairs, transfers, dressing, and locomotion level  PARTICIPATION LIMITATIONS: meal prep, cleaning, laundry, driving, shopping, community activity, and occupation  PERSONAL FACTORS: Past/current experiences, Time since onset of injury/illness/exacerbation, Transportation, and 3+ comorbidities: Epilepsy, fibromyalgia, history of seizures, chronic low back pain, and anxiety are also affecting patient's functional outcome.   REHAB POTENTIAL: Good  CLINICAL DECISION MAKING: Evolving/moderate complexity  EVALUATION COMPLEXITY: Moderate   GOALS: Goals reviewed with patient? Yes  SHORT TERM GOALS: Target date: 11/23/23 Patient will be independent with her initial HEP. Baseline: Goal status: INITIAL  2.  Patient will be able to complete her daily activities without her familiar pain exceeding 8/10. Baseline:  Goal status: INITIAL  3.  Patient will improve her active left ankle dorsiflexion to at least neutral for improved ankle mobility. Baseline:  Goal status: INITIAL  4.  Patient will improve her LEFS score to at least 30/80 for improved perceived function with her daily activities. Baseline:  Goal status: INITIAL  LONG TERM GOALS: Target date: 12/21/23  Patient will be independent with her advanced HEP. Baseline:  Goal status: INITIAL  2.  Patient will improve her active left ankle dorsiflexion to at least 5 degrees for improved gait mechanics. Baseline:  Goal status: INITIAL  3.  Patient will be able to demonstrate minimal to no significant gait deviations for improved functional mobility. Baseline:  Goal  status: INITIAL  4.  Patient will improve her LEFS score to at least 45/80 for improved perceived function with her daily activities. Baseline:  Goal status: INITIAL  5.  Patient will be able to navigate at least 4 steps with a reciprocal pattern for improved household mobility. Baseline:  Goal status: INITIAL  6.  Patient will be able to return to her regular gym activities without being limited by her familiar left ankle symptoms. Baseline:  Goal status: INITIAL   PLAN:  PT FREQUENCY: 2x/week  PT DURATION: 8 weeks  PLANNED INTERVENTIONS: 97164- PT Re-evaluation, 97750- Physical Performance Testing, 97110-Therapeutic exercises, 97530- Therapeutic activity, [id number]- Neuromuscular re-education, 97535- Self Care, 25366- Manual therapy, [phone]- Gait training, [phone]- Electrical stimulation (unattended), 97016- Vasopneumatic device, Patient/Family education, Balance training, Stair training, Joint mobilization, Cryotherapy, and Moist heat  PLAN FOR NEXT SESSION: Nustep, PROM, manual therapy, and modalities as needed   Lane Pinon, PT 10/26/2023, 6:31 PM

## 2023-10-28 ENCOUNTER — Ambulatory Visit

## 2023-10-28 DIAGNOSIS — M25572 Pain in left ankle and joints of left foot: Secondary | ICD-10-CM | POA: Diagnosis not present

## 2023-10-28 DIAGNOSIS — M25672 Stiffness of left ankle, not elsewhere classified: Secondary | ICD-10-CM

## 2023-10-28 NOTE — Therapy (Signed)
 OUTPATIENT PHYSICAL THERAPY LOWER EXTREMITY TREATMENT   Patient Name: [name] MRN: [id number] DOB:1980-06-21, 43 y.o., female Today's Date: 10/28/2023  END OF SESSION:  PT End of Session - 10/28/23 0915     Visit Number 2    Number of Visits 16    Date for PT Re-Evaluation 01/07/24    PT Start Time 0900    PT Stop Time 1000    PT Time Calculation (min) 60 min    Activity Tolerance Patient tolerated treatment well    Behavior During Therapy WFL for tasks assessed/performed              Past Medical History:  Diagnosis Date   Abnormal Pap smear of cervix    Acne fulminans    Allergic rhinitis    Brain cyst    Breast nodule    Cervical polyp    Epilepsy (HCC)    Fibromyalgia    GERD (gastroesophageal reflux disease)    IBS (irritable bowel syndrome)    Kidney stones    Melanoma of hip (HCC)    Menorrhagia    Migraine headache    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HCC)    Previous stillbirth or demise, antepartum    Pulmonary nodule    previous evaluation - benign   Seizures (HCC)    recently diagnosed with epilepsy   Sepsis(995.91)    Shortness of breath    due to wt   Small bowel obstruction (HCC)    Staphylococcal infection    Past Surgical History:  Procedure Laterality Date   BREAST EXCISIONAL BIOPSY Left    caesarean section     HYSTEROSCOPY WITH D & C  04/27/2011   Procedure: DILATATION AND CURETTAGE (D&C) /HYSTEROSCOPY;  Surgeon: Johnn Najjar, MD;  Location: [facility];  Service: Gynecology;  Laterality: N/A;   MELANOMA EXCISION     svd     x 1 - stillborn at 75 weeks   Patient Active Problem List   Diagnosis Date Noted   Solitary pulmonary nodule 07/10/2018   Dysuria 07/08/2018   Morbid obesity due to excess calories (HCC) 06/25/2018   DOE (dyspnea on exertion) 06/20/2018   Chronic cough 06/20/2018   PALPITATIONS 06/12/2010   CHEST PAIN, PRECORDIAL 06/12/2010   Migraine headache 06/11/2010   IBS 06/11/2010   ALLERGIC RHINITIS 09/11/2009     PCP: Artie Laster, FNP  REFERRING PROVIDER: Velma Ghazi, DPM   REFERRING DIAG: Plantar fasciitis of left foot, Achilles tendinitis of left lower extremity, S/P foot surgery   THERAPY DIAG:  Pain in left ankle and joints of left foot  Stiffness of left ankle, not elsewhere classified  Rationale for Evaluation and Treatment: Rehabilitation  ONSET DATE: 09/20/23  SUBJECTIVE:   SUBJECTIVE STATEMENT: Patient reports that she feels alright today.   PERTINENT HISTORY: Epilepsy, fibromyalgia, history of seizures, chronic low back pain, and anxiety PAIN:  Are you having pain? Yes: NPRS scale: Current: 2/10 Best: 2/10 Worst: 10/10 Pain location: posterior ankle and plantar surface of the foot Pain description: constant dull  Aggravating factors: walking (30-45 minutes) anything touching her heel Relieving factors: heat  PRECAUTIONS: Other: Ankle  RED FLAGS: None   WEIGHT BEARING RESTRICTIONS: Yes WBAT  FALLS:  Has patient fallen in last 6 months? No  LIVING ENVIRONMENT: Lives with: lives with their spouse Lives in: House/apartment Stairs: Yes: External: 6 steps; can reach both; step to pattern Has following equipment at home: Walker - 4 wheeled  OCCUPATION: not working currently; [employer]  at front dest  PLOF: Independent  PATIENT GOALS: be able to climb steps, and return to weightlifting  NEXT MD VISIT: 11/17/23  OBJECTIVE:  Note: Objective measures were completed at Evaluation unless otherwise noted.  PATIENT SURVEYS:  LEFS 18/80  COGNITION: Overall cognitive status: Within functional limits for tasks assessed     SENSATION: Patient reports no numbness or tingling  EDEMA:  Figure 8: Left: 58.5 cm Right: 57.5 cm   PALPATION: TTP: left gastroc/soleus, medial and lateral malleolus, plantar fascia  JOINT MOBILITY:   Left foot and ankle: hypomobile and painful   LOWER EXTREMITY ROM:  Active ROM Right eval Left eval  Hip flexion    Hip  extension    Hip abduction    Hip adduction    Hip internal rotation    Hip external rotation    Knee flexion    Knee extension    Ankle dorsiflexion 5 -5  Ankle plantarflexion 65 25  Ankle inversion 43 11  Ankle eversion 28 5   (Blank rows = not tested)  LOWER EXTREMITY MMT: not tested due to surgical condition  MMT Right eval Left eval  Hip flexion    Hip extension    Hip abduction    Hip adduction    Hip internal rotation    Hip external rotation    Knee flexion    Knee extension    Ankle dorsiflexion    Ankle plantarflexion    Ankle inversion    Ankle eversion     (Blank rows = not tested)  GAIT: Assistive device utilized: Environmental consultant - 4 wheeled Level of assistance: Modified independence Comments: WBAT with CAM boot                                                                                                                                 TREATMENT DATE:                                     10/28/23 EXERCISE LOG  Exercise Repetitions and Resistance Comments  Nustep  L1 x 10 minutes @ seat 7   Toe curls  30 reps    Manual therapy  See below    L ankle AROM  20 reps each  Open chain; all planes       Blank cell = exercise not performed today  Manual Therapy Soft Tissue Mobilization: Left gastrocnemius, soleus, anterior tibialis, and fibularis longus, for improved soft tissue extensibility Passive ROM: Left ankle, all planes to tolerance  Modalities: no adverse reaction to today's modalities  Date:  Vaso: Ankle, 34 degrees; low pressure, 15 mins, Pain and Edema  PATIENT EDUCATION:  Education details: healing, surgical condition Person educated: Patient Education method: Explanation Education comprehension: verbalized understanding  HOME EXERCISE PROGRAM:   ASSESSMENT:  CLINICAL IMPRESSION: Patient was introduced to multiple and knee active and  passive interventions for improved left ankle mobility. Manual therapy focused on left foot and ankle  passive range of motion and soft tissue mobilization to the surrounding musculature to promote improved mobility. She exhibited significant muscle guarding initially as she was concerned about damage to the surgical site, but this was able to be significantly improved with education about her surgical condition and healing. This was followed by appropriately matched left foot and ankle active range of motion. She reported feeling sore upon the conclusion of treatment. Recommend that she continue with skilled physical therapy to address her remaining impairments to return to her prior level of function.  OBJECTIVE IMPAIRMENTS: Abnormal gait, decreased activity tolerance, decreased mobility, difficulty walking, decreased ROM, decreased strength, hypomobility, increased edema, impaired flexibility, impaired sensation, impaired tone, and pain.   ACTIVITY LIMITATIONS: carrying, lifting, standing, squatting, sleeping, stairs, transfers, dressing, and locomotion level  PARTICIPATION LIMITATIONS: meal prep, cleaning, laundry, driving, shopping, community activity, and occupation  PERSONAL FACTORS: Past/current experiences, Time since onset of injury/illness/exacerbation, Transportation, and 3+ comorbidities: Epilepsy, fibromyalgia, history of seizures, chronic low back pain, and anxiety are also affecting patient's functional outcome.   REHAB POTENTIAL: Good  CLINICAL DECISION MAKING: Evolving/moderate complexity  EVALUATION COMPLEXITY: Moderate   GOALS: Goals reviewed with patient? Yes  SHORT TERM GOALS: Target date: 11/23/23 Patient will be independent with her initial HEP. Baseline: Goal status: INITIAL  2.  Patient will be able to complete her daily activities without her familiar pain exceeding 8/10. Baseline:  Goal status: INITIAL  3.  Patient will improve her active left ankle dorsiflexion to at least neutral for improved ankle mobility. Baseline:  Goal status: INITIAL  4.  Patient  will improve her LEFS score to at least 30/80 for improved perceived function with her daily activities. Baseline:  Goal status: INITIAL  LONG TERM GOALS: Target date: 12/21/23  Patient will be independent with her advanced HEP. Baseline:  Goal status: INITIAL  2.  Patient will improve her active left ankle dorsiflexion to at least 5 degrees for improved gait mechanics. Baseline:  Goal status: INITIAL  3.  Patient will be able to demonstrate minimal to no significant gait deviations for improved functional mobility. Baseline:  Goal status: INITIAL  4.  Patient will improve her LEFS score to at least 45/80 for improved perceived function with her daily activities. Baseline:  Goal status: INITIAL  5.  Patient will be able to navigate at least 4 steps with a reciprocal pattern for improved household mobility. Baseline:  Goal status: INITIAL  6.  Patient will be able to return to her regular gym activities without being limited by her familiar left ankle symptoms. Baseline:  Goal status: INITIAL   PLAN:  PT FREQUENCY: 2x/week  PT DURATION: 8 weeks  PLANNED INTERVENTIONS: 97164- PT Re-evaluation, 97750- Physical Performance Testing, 97110-Therapeutic exercises, 97530- Therapeutic activity, [id number]- Neuromuscular re-education, 97535- Self Care, [id number]- Manual therapy, [phone]- Gait training, [phone]- Electrical stimulation (unattended), 97016- Vasopneumatic device, Patient/Family education, Balance training, Stair training, Joint mobilization, Cryotherapy, and Moist heat  PLAN FOR NEXT SESSION: Nustep, PROM, manual therapy, and modalities as needed   Lane Pinon, PT 10/28/2023, 10:10 AM

## 2023-10-29 ENCOUNTER — Other Ambulatory Visit: Payer: Self-pay

## 2023-10-29 ENCOUNTER — Encounter (HOSPITAL_COMMUNITY): Payer: Self-pay | Admitting: *Deleted

## 2023-10-29 ENCOUNTER — Emergency Department (HOSPITAL_COMMUNITY)

## 2023-10-29 ENCOUNTER — Emergency Department (HOSPITAL_COMMUNITY)
Admission: EM | Admit: 2023-10-29 | Discharge: 2023-10-29 | Disposition: A | Discharge status: Home / Self Care | Attending: Emergency Medicine | Admitting: Emergency Medicine

## 2023-10-29 DIAGNOSIS — R42 Dizziness and giddiness: Secondary | ICD-10-CM | POA: Diagnosis present

## 2023-10-29 DIAGNOSIS — D329 Benign neoplasm of meninges, unspecified: Secondary | ICD-10-CM | POA: Diagnosis not present

## 2023-10-29 LAB — CBC
HCT: 36.5 % (ref 36.0–46.0)
Hemoglobin: 12.1 g/dL (ref 12.0–15.0)
MCH: 29.2 pg (ref 26.0–34.0)
MCHC: 33.2 g/dL (ref 30.0–36.0)
MCV: 88 fL (ref 80.0–100.0)
Platelets: 267 10*3/uL (ref 150–400)
RBC: 4.15 MIL/uL (ref 3.87–5.11)
RDW: 12.6 % (ref 11.5–15.5)
WBC: 7.1 10*3/uL (ref 4.0–10.5)
nRBC: 0 % (ref 0.0–0.2)

## 2023-10-29 LAB — COMPREHENSIVE METABOLIC PANEL WITH GFR
ALT: 22 U/L (ref 0–44)
AST: 19 U/L (ref 15–41)
Albumin: 3.6 g/dL (ref 3.5–5.0)
Alkaline Phosphatase: 78 U/L (ref 38–126)
Anion gap: 7 (ref 5–15)
BUN: 14 mg/dL (ref 6–20)
CO2: 25 mmol/L (ref 22–32)
Calcium: 8.8 mg/dL — ABNORMAL LOW (ref 8.9–10.3)
Chloride: 104 mmol/L (ref 98–111)
Creatinine, Ser: 0.78 mg/dL (ref 0.44–1.00)
GFR, Estimated: >60 mL/min (ref >60)
Glucose, Bld: 106 mg/dL — ABNORMAL HIGH (ref 70–99)
Potassium: 3.8 mmol/L (ref 3.5–5.1)
Sodium: 136 mmol/L (ref 135–145)
Total Bilirubin: 0.5 mg/dL (ref 0.0–1.2)
Total Protein: 7 g/dL (ref 6.5–8.1)

## 2023-10-29 LAB — DIFFERENTIAL
Abs Immature Granulocytes: 0.04 10*3/uL (ref 0.00–0.07)
Basophils Absolute: 0.1 10*3/uL (ref 0.0–0.1)
Basophils Relative: 1 %
Eosinophils Absolute: 0.2 10*3/uL (ref 0.0–0.5)
Eosinophils Relative: 3 %
Immature Granulocytes: 1 %
Lymphocytes Relative: 30 %
Lymphs Abs: 2.1 10*3/uL (ref 0.7–4.0)
Monocytes Absolute: 0.6 10*3/uL (ref 0.1–1.0)
Monocytes Relative: 8 %
Neutro Abs: 4.1 10*3/uL (ref 1.7–7.7)
Neutrophils Relative %: 57 %

## 2023-10-29 LAB — RAPID URINE DRUG SCREEN, HOSP PERFORMED
Amphetamines: NOT DETECTED
Barbiturates: NOT DETECTED
Benzodiazepines: NOT DETECTED
Cocaine: NOT DETECTED
Opiates: NOT DETECTED
Tetrahydrocannabinol: NOT DETECTED

## 2023-10-29 LAB — POC URINE PREG, ED: Preg Test, Ur: NEGATIVE

## 2023-10-29 LAB — PROTIME-INR
INR: 1 (ref 0.8–1.2)
Prothrombin Time: 13.5 s (ref 11.4–15.2)

## 2023-10-29 LAB — ETHANOL: Alcohol, Ethyl (B): <15 mg/dL (ref <15)

## 2023-10-29 LAB — APTT: aPTT: 25 s (ref 24–36)

## 2023-10-29 MED ORDER — METOCLOPRAMIDE HCL 5 MG/ML IJ SOLN
5.0000 mg | Freq: Once | INTRAMUSCULAR | Status: AC
  Administered 2023-10-29: 5 mg via INTRAVENOUS
  Filled 2023-10-29: qty 2

## 2023-10-29 MED ORDER — SODIUM CHLORIDE 0.9 % IV BOLUS
1000.0000 mL | Freq: Once | INTRAVENOUS | Status: AC
  Administered 2023-10-29: 1000 mL via INTRAVENOUS

## 2023-10-29 MED ORDER — DIPHENHYDRAMINE HCL 50 MG/ML IJ SOLN
25.0000 mg | Freq: Once | INTRAMUSCULAR | Status: AC
  Administered 2023-10-29: 25 mg via INTRAVENOUS
  Filled 2023-10-29: qty 1

## 2023-10-29 MED ORDER — MECLIZINE HCL 25 MG PO TABS: 25.0000 mg | ORAL_TABLET | Freq: Three times a day (TID) | ORAL | 0 refills | Status: AC | PRN

## 2023-10-29 MED ORDER — IOHEXOL 350 MG/ML SOLN
75.0000 mL | Freq: Once | INTRAVENOUS | Status: AC | PRN
  Administered 2023-10-29: 75 mL via INTRAVENOUS

## 2023-10-29 MED ORDER — GADOBUTROL 1 MMOL/ML IV SOLN
10.0000 mL | Freq: Once | INTRAVENOUS | Status: AC | PRN
  Administered 2023-10-29: 10 mL via INTRAVENOUS

## 2023-10-29 MED ORDER — MECLIZINE HCL 12.5 MG PO TABS
25.0000 mg | ORAL_TABLET | Freq: Once | ORAL | Status: AC
  Administered 2023-10-29: 25 mg via ORAL
  Filled 2023-10-29: qty 2

## 2023-10-29 NOTE — ED Provider Notes (Signed)
 [place] [facility] AT [facility] Provider Note  CSN: [id number] Arrival date & time: 10/29/23 1308  Chief Complaint(s) Dizziness  HPI [name] is a 43 y.o. female with past medical history as below, significant for epilepsy, seizure (last > 6 mos ago, zonisamide), IBS, GERD who presents to the ED with complaint of dizziness  Patient reports around 5:15 AM she woke up to let her dogs and walk, felt fine.  She went back to bed, got up 7 AM, felt severe dizziness, spinning sensation, felt her vision was blurry bilateral.  Felt she was weak on her left side, had reduced sensation on right side of her face and right arm, right leg.  Nauseated, no vomiting.  Patient reports that her right arm has been weak since her last seizure.  She reports increased sensation to left side of her face and left arm and leg, question reduced sensation contralateral.  Denies similar symptoms in the past.  Reports she felt normal when she went to bed last night   Past Medical History Past Medical History:  Diagnosis Date   Abnormal Pap smear of cervix    Acne fulminans    Allergic rhinitis    Brain cyst    Breast nodule    Cervical polyp    Epilepsy (HCC)    Fibromyalgia    GERD (gastroesophageal reflux disease)    IBS (irritable bowel syndrome)    Kidney stones    Melanoma of hip (HCC)    Menorrhagia    Migraine headache    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HCC)    Previous stillbirth or demise, antepartum    Pulmonary nodule    previous evaluation - benign   Seizures (HCC)    recently diagnosed with epilepsy   Sepsis(995.91)    Shortness of breath    due to wt   Small bowel obstruction (HCC)    Staphylococcal infection    Patient Active Problem List   Diagnosis Date Noted   Solitary pulmonary nodule 07/10/2018   Dysuria 07/08/2018   Morbid obesity due to excess calories (HCC) 06/25/2018   DOE (dyspnea on exertion) 06/20/2018   Chronic cough 06/20/2018   PALPITATIONS  06/12/2010   CHEST PAIN, PRECORDIAL 06/12/2010   Migraine headache 06/11/2010   IBS 06/11/2010   ALLERGIC RHINITIS 09/11/2009   Home Medication(s) Prior to Admission medications   Medication Sig Start Date End Date Taking? Authorizing Provider  acetaminophen  (TYLENOL ) 500 MG tablet Take 1,000 mg by mouth.   Yes [provider]  cyclobenzaprine  (FLEXERIL ) 10 MG tablet Take 1 tablet (10 mg total) by mouth 3 (three) times daily as needed for muscle spasms. 08/24/23  Yes Velma Ghazi, DPM  cyclobenzaprine  (FLEXERIL ) 5 MG tablet Take 5 mg by mouth 3 (three) times daily as needed.   Yes [provider]  diclofenac Sodium (VOLTAREN) 1 % GEL Apply 2 g topically 4 (four) times daily.   Yes [provider]  escitalopram (LEXAPRO) 20 MG tablet Take 20 mg by mouth daily.   Yes [provider]  gabapentin  (NEURONTIN ) 100 MG capsule Take 100 mg by mouth 3 (three) times daily.   Yes [provider]  ketorolac  (TORADOL ) 10 MG tablet Take 1 tablet (10 mg total) by mouth every 6 (six) hours as needed. 09/30/23  Yes Velma Ghazi, DPM  levonorgestrel (MIRENA) 20 MCG/DAY IUD by Intrauterine route.   Yes [provider]  lidocaine  (LIDODERM ) 5 % Place 1 patch onto the skin.  10/20/23 11/09/23 Yes [provider]  meclizine  (ANTIVERT ) 25 MG tablet Take 1 tablet (25 mg total) by mouth 3 (three) times daily as needed for dizziness. 10/29/23  Yes Teddi Favors, DO  oxyCODONE -acetaminophen  (PERCOCET) 5-325 MG tablet Take 1 tablet by mouth every 4 (four) hours as needed for severe pain (pain score 7-10). 09/20/23  Yes Velma Ghazi, DPM  oxyCODONE -acetaminophen  (PERCOCET) 5-325 MG tablet Take 1 tablet by mouth every 4 (four) hours as needed for severe pain (pain score 7-10). 09/30/23  Yes Velma Ghazi, DPM  traZODone (DESYREL) 50 MG tablet Take 1 tablet by mouth at bedtime. 05/31/17  Yes [provider]  drospirenone-ethinyl estradiol (VESTURA) 3-0.02  MG tablet  06/07/23   [provider]  meloxicam  (MOBIC ) 15 MG tablet Take 15 mg by mouth daily. Patient not taking: Reported on 10/29/2023 07/10/21   [provider]  WEGOVY  0.25 MG/0.5ML SOAJ Inject 0.5 mLs (0.25 mg dose) into the skin once a week at 0900 for 4 doses. Patient not taking: Reported on 10/29/2023 10/21/22     zonisamide (ZONEGRAN) 50 MG capsule Take 100 mg by mouth 2 (two) times daily. Patient not taking: Reported on 10/29/2023    [provider]                                                                                                                                    Past Surgical History Past Surgical History:  Procedure Laterality Date   BREAST EXCISIONAL BIOPSY Left    caesarean section     HYSTEROSCOPY WITH D & C  04/27/2011   Procedure: DILATATION AND CURETTAGE (D&C) /HYSTEROSCOPY;  Surgeon: Johnn Najjar, MD;  Location: [facility];  Service: Gynecology;  Laterality: N/A;   MELANOMA EXCISION     svd     x 1 - stillborn at 73 weeks   Family History Family History  Problem Relation Age of Onset   Emphysema Paternal Grandmother    Heart disease Paternal Grandmother    Emphysema Paternal Grandfather    Cancer Paternal Grandfather        throat cancer    Social History Social History   Tobacco Use   Smoking status: Never   Smokeless tobacco: Never  Vaping Use   Vaping status: Never Used  Substance Use Topics   Alcohol use: No   Drug use: No   Allergies Duloxetine, Penicillins, Sulfonamide derivatives, Amitriptyline, and Cymbalta [duloxetine hcl]  Review of Systems A thorough review of systems was obtained and all systems are negative except as noted in the HPI and PMH.   Physical Exam Vital Signs  I have reviewed the triage vital signs BP 117/71   Pulse 78   Temp 98.3 F (36.8 C) (Oral)   Resp 11   Ht 5\' 3"  (1.6 m)   Wt 104.3 kg   SpO2 93%   BMI 40.74 kg/m  Physical Exam Vitals  and nursing note reviewed.   Constitutional:      General: She is not in acute distress.    Appearance: Normal appearance. She is obese.  HENT:     Head: Normocephalic and atraumatic.     Right Ear: External ear normal.     Left Ear: External ear normal.     Nose: Nose normal.     Mouth/Throat:     Mouth: Mucous membranes are moist.  Eyes:     General: No visual field deficit or scleral icterus.       Right eye: No discharge.        Left eye: No discharge.     Extraocular Movements: Extraocular movements intact.     Pupils: Pupils are equal, round, and reactive to light.  Cardiovascular:     Rate and Rhythm: Normal rate and regular rhythm.     Pulses: Normal pulses.     Heart sounds: Normal heart sounds.  Pulmonary:     Effort: Pulmonary effort is normal. No respiratory distress.     Breath sounds: Normal breath sounds. No stridor.  Abdominal:     General: Abdomen is flat. There is no distension.     Palpations: Abdomen is soft.     Tenderness: There is no abdominal tenderness.  Musculoskeletal:     Cervical back: No rigidity.     Right lower leg: No edema.     Left lower leg: No edema.     Comments: LLE in boot  Skin:    General: Skin is warm and dry.     Capillary Refill: Capillary refill takes less than 2 seconds.  Neurological:     Mental Status: She is alert and oriented to person, place, and time.     GCS: GCS eye subscore is 4. GCS verbal subscore is 5. GCS motor subscore is 6.     Cranial Nerves: Cranial nerves 2-12 are intact. No dysarthria or facial asymmetry.     Sensory: Sensory deficit present.     Motor: Weakness present. No tremor.     Coordination: Coordination is intact. Finger-Nose-Finger Test normal.     Gait: Gait is intact.     Comments: hyperesthesia sensation to L V2/V3, slight reduced sensation to LUE LLE  Strength 4/5 LUE, very slight drift LUE  Gait abnormal as she is wearing walking boot LLE  NIHSS 2 (although pt does report her left arm as been weak since last  seizure which would bring her NIHSS to a 1)  Psychiatric:        Mood and Affect: Mood normal.        Behavior: Behavior normal. Behavior is cooperative.     ED Results and Treatments Labs (all labs ordered are listed, but only abnormal results are displayed) Labs Reviewed  COMPREHENSIVE METABOLIC PANEL WITH GFR - Abnormal; Notable for the following components:      Result Value   Glucose, Bld 106 (*)    Calcium 8.8 (*)    All other components within normal limits  ETHANOL  PROTIME-INR  APTT  CBC  DIFFERENTIAL  RAPID URINE DRUG SCREEN, HOSP PERFORMED  POC URINE PREG, ED  Radiology No results found.  Pertinent labs & imaging results that were available during my care of the patient were reviewed by me and considered in my medical decision making (see MDM for details).  Medications Ordered in ED Medications  meclizine  (ANTIVERT ) tablet 25 mg (25 mg Oral Given 10/29/23 0943)  sodium chloride  0.9 % bolus 1,000 mL (0 mLs Intravenous Stopped 10/29/23 1109)  iohexol  (OMNIPAQUE ) 350 MG/ML injection 75 mL (75 mLs Intravenous Contrast Given 10/29/23 1002)  metoCLOPramide  (REGLAN ) injection 5 mg (5 mg Intravenous Given 10/29/23 1133)  diphenhydrAMINE  (BENADRYL ) injection 25 mg (25 mg Intravenous Given 10/29/23 1132)  gadobutrol  (GADAVIST ) 1 MMOL/ML injection 10 mL (10 mLs Intravenous Contrast Given 10/29/23 1252)                                                                                                                                     Procedures Procedures  (including critical care time)  Medical Decision Making / ED Course    Medical Decision Making:    VELERA LANSDALE is a 43 y.o. female with past medical history as below, significant for epilepsy, seizure (last > 6 mos ago, zonisamide), IBS, GERD who presents to the ED with complaint of dizziness. The  complaint involves an extensive differential diagnosis and also carries with it a high risk of complications and morbidity.  Serious etiology was considered. Ddx includes but is not limited to: vertigo peripheral vs central, cva, infection, dehydration, vestibular neuritis, dysequilibrium, labrythitis, etc  Complete initial physical exam performed, notably the patient was in no distress, hds.    Reviewed and confirmed nursing documentation for past medical history, family history, social history.  Vital signs reviewed.     Brief summary:  43 yo female w/ hx seizure on zonisamide here with dizziness, sensation change  Last known normal around 5:15AM this morning.  Symptoms noticed around 7 AM.  Room spinning sensation, nausea, left arm weakness, increased sensation to left side of face and left arm, left leg.  No headache.  She feels her left arm has been chronically weak since her last seizure, seems unchanged NIHSS 2 Fatiguable horiz nystagmus noted   Clinical Course as of 11/02/23 0707  Fri Oct 29, 2023  1308 Given her NIHSS of 1-2; does not appear to be TNK candidate at this time. Spoke with neurology (Dr Renaee Caro) who recommends MRI  [SG]    Clinical Course User Index [SG] Teddi Favors, DO     Imaging is stable, does show some chronic changes and also a meningioma. Advised she f/u with neurology (reports she has appt this week) regarding meningioma surveillance. Will trial meclizine  for home. F/u ENT +/- vestibular rehab  Patient in no distress and overall condition improved here in the ED. Detailed discussions were had with the patient/guardian regarding current findings, and need for close f/u with PCP or on call doctor. The patient/guardian has been instructed to return  immediately if the symptoms worsen in any way for re-evaluation. Patient/guardian verbalized understanding and is in agreement with current care plan. All questions answered prior to  discharge.           Additional history obtained: -Additional history obtained from spouse -External records from outside source obtained and reviewed including: Chart review including previous notes, labs, imaging, consultation notes including  Allergy  list Primary care documentation Home meds   Lab Tests: -I ordered, reviewed, and interpreted labs.   The pertinent results include:   Labs Reviewed  COMPREHENSIVE METABOLIC PANEL WITH GFR - Abnormal; Notable for the following components:      Result Value   Glucose, Bld 106 (*)    Calcium 8.8 (*)    All other components within normal limits  ETHANOL  PROTIME-INR  APTT  CBC  DIFFERENTIAL  RAPID URINE DRUG SCREEN, HOSP PERFORMED  POC URINE PREG, ED    Notable for labs stable  EKG   EKG Interpretation Date/Time:    Ventricular Rate:    PR Interval:    QRS Duration:    QT Interval:    QTC Calculation:   R Axis:      Text Interpretation:           Imaging Studies ordered: I ordered imaging studies including CTH CTA MRI brain I independently visualized the following imaging with scope of interpretation limited to determining acute life threatening conditions related to emergency care; findings noted above I agree with the radiologist interpretation If any imaging was obtained with contrast I closely monitored patient for any possible adverse reaction a/w contrast administration in the emergency department   Medicines ordered and prescription drug management: Meds ordered this encounter  Medications   meclizine  (ANTIVERT ) tablet 25 mg   sodium chloride  0.9 % bolus 1,000 mL   iohexol  (OMNIPAQUE ) 350 MG/ML injection 75 mL   metoCLOPramide  (REGLAN ) injection 5 mg   diphenhydrAMINE  (BENADRYL ) injection 25 mg   gadobutrol  (GADAVIST ) 1 MMOL/ML injection 10 mL   meclizine  (ANTIVERT ) 25 MG tablet    Sig: Take 1 tablet (25 mg total) by mouth 3 (three) times daily as needed for dizziness.    Dispense:  30 tablet     Refill:  0    -I have reviewed the patients home medicines and have made adjustments as needed   Consultations Obtained: I requested consultation with the neurology,  and discussed lab and imaging findings as well as pertinent plan - they recommend: MRI   Cardiac Monitoring: The patient was maintained on a cardiac monitor.  I personally viewed and interpreted the cardiac monitored which showed an underlying rhythm of: NSR Continuous pulse oximetry interpreted by myself, 96% on RA.    Social Determinants of Health:  Diagnosis or treatment significantly limited by social determinants of health: obesity   Reevaluation: After the interventions noted above, I reevaluated the patient and found that they have improved  Co morbidities that complicate the patient evaluation  Past Medical History:  Diagnosis Date   Abnormal Pap smear of cervix    Acne fulminans    Allergic rhinitis    Brain cyst    Breast nodule    Cervical polyp    Epilepsy (HCC)    Fibromyalgia    GERD (gastroesophageal reflux disease)    IBS (irritable bowel syndrome)    Kidney stones    Melanoma of hip (HCC)    Menorrhagia    Migraine headache    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HCC)  Previous stillbirth or demise, antepartum    Pulmonary nodule    previous evaluation - benign   Seizures (HCC)    recently diagnosed with epilepsy   Sepsis(995.91)    Shortness of breath    due to wt   Small bowel obstruction (HCC)    Staphylococcal infection       Dispostion: Disposition decision including need for hospitalization was considered, and patient discharged from emergency department.    Final Clinical Impression(s) / ED Diagnoses Final diagnoses:  Meningioma [facility])  Dizziness        Teddi Favors, DO 11/02/23 [phone]

## 2023-10-29 NOTE — ED Notes (Signed)
 ED Provider at bedside.

## 2023-10-29 NOTE — Discharge Instructions (Addendum)
 It was a pleasure caring for you today in the emergency department.  Please follow up with your neurologist in the office  If you still have dizzy sensation you can follow up with ENT to see if vestibular rehab would be appropriate  Please return to the emergency department for any worsening or worrisome symptoms.

## 2023-10-29 NOTE — ED Triage Notes (Addendum)
 Pt c/o dizziness, nausea, blurry vision, and left sided facial pain that started at 0700 when she went to roll over in bed. Pt reports she got up to let her dogs out around 0500 and went back to sleep around 0515 but didn't have any symptoms at that time. Pt reports some left sided weakness on her leg but she has had that since she had surgery recently, nothing new that started today.

## 2023-10-29 NOTE — ED Notes (Signed)
 Pt is lying in bed with family at bedside resting. She stated " I still feel some pressure when I turn my head to the right but I feel a lot better than I did."   Pt denies N/V or dizziness at this time.

## 2023-11-01 ENCOUNTER — Encounter: Payer: Self-pay | Admitting: Podiatry

## 2023-11-01 ENCOUNTER — Ambulatory Visit

## 2023-11-01 DIAGNOSIS — M25572 Pain in left ankle and joints of left foot: Secondary | ICD-10-CM

## 2023-11-01 DIAGNOSIS — R42 Dizziness and giddiness: Secondary | ICD-10-CM | POA: Insufficient documentation

## 2023-11-01 DIAGNOSIS — M25672 Stiffness of left ankle, not elsewhere classified: Secondary | ICD-10-CM

## 2023-11-01 NOTE — Therapy (Signed)
 OUTPATIENT PHYSICAL THERAPY LOWER EXTREMITY TREATMENT   Patient Name: [name] MRN: [id number] DOB:January 14, 1981, 43 y.o., female Today's Date: 11/01/2023  END OF SESSION:  PT End of Session - 11/01/23 1650     Visit Number 3    Number of Visits 16    Date for PT Re-Evaluation 01/07/24    PT Start Time 1645    PT Stop Time 1739    PT Time Calculation (min) 54 min    Activity Tolerance Patient tolerated treatment well    Behavior During Therapy WFL for tasks assessed/performed               Past Medical History:  Diagnosis Date   Abnormal Pap smear of cervix    Acne fulminans    Allergic rhinitis    Brain cyst    Breast nodule    Cervical polyp    Epilepsy (HCC)    Fibromyalgia    GERD (gastroesophageal reflux disease)    IBS (irritable bowel syndrome)    Kidney stones    Melanoma of hip (HCC)    Menorrhagia    Migraine headache    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HCC)    Previous stillbirth or demise, antepartum    Pulmonary nodule    previous evaluation - benign   Seizures (HCC)    recently diagnosed with epilepsy   Sepsis(995.91)    Shortness of breath    due to wt   Small bowel obstruction (HCC)    Staphylococcal infection    Past Surgical History:  Procedure Laterality Date   BREAST EXCISIONAL BIOPSY Left    caesarean section     HYSTEROSCOPY WITH D & C  04/27/2011   Procedure: DILATATION AND CURETTAGE (D&C) /HYSTEROSCOPY;  Surgeon: Johnn Najjar, MD;  Location: [facility];  Service: Gynecology;  Laterality: N/A;   MELANOMA EXCISION     svd     x 1 - stillborn at 69 weeks   Patient Active Problem List   Diagnosis Date Noted   Solitary pulmonary nodule 07/10/2018   Dysuria 07/08/2018   Morbid obesity due to excess calories (HCC) 06/25/2018   DOE (dyspnea on exertion) 06/20/2018   Chronic cough 06/20/2018   PALPITATIONS 06/12/2010   CHEST PAIN, PRECORDIAL 06/12/2010   Migraine headache 06/11/2010   IBS 06/11/2010   ALLERGIC RHINITIS 09/11/2009     PCP: Artie Laster, FNP  REFERRING PROVIDER: Velma Ghazi, DPM   REFERRING DIAG: Plantar fasciitis of left foot, Achilles tendinitis of left lower extremity, S/P foot surgery   THERAPY DIAG:  Pain in left ankle and joints of left foot  Stiffness of left ankle, not elsewhere classified  Rationale for Evaluation and Treatment: Rehabilitation  ONSET DATE: 09/20/23  SUBJECTIVE:   SUBJECTIVE STATEMENT: Patient reports that she was sore for about 2 days after her last appointment. She has some sharp pain at her 4th toe, but it feels like a "nerve pain."   PERTINENT HISTORY: Epilepsy, fibromyalgia, history of seizures, chronic low back pain, and anxiety PAIN:  Are you having pain? Yes: NPRS scale: Current: 4/10 Best: 2/10 Worst: 10/10 Pain location: posterior ankle and plantar surface of the foot Pain description: constant dull  Aggravating factors: walking (30-45 minutes) anything touching her heel Relieving factors: heat  PRECAUTIONS: Other: Ankle  RED FLAGS: None   WEIGHT BEARING RESTRICTIONS: Yes WBAT  FALLS:  Has patient fallen in last 6 months? 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  PATIENT SURVEYS:  LEFS 18/80  COGNITION: Overall cognitive status: Within functional limits for tasks assessed     SENSATION: Patient reports no numbness or tingling  EDEMA:  Figure 8: Left: 58.5 cm Right: 57.5 cm   PALPATION: TTP: left gastroc/soleus, medial and lateral malleolus, plantar fascia  JOINT MOBILITY:   Left foot and ankle:  hypomobile and painful   LOWER EXTREMITY ROM:  Active ROM Right eval Left eval  Hip flexion    Hip extension    Hip abduction    Hip adduction    Hip internal rotation    Hip external rotation    Knee flexion    Knee extension    Ankle dorsiflexion 5 -5  Ankle plantarflexion 65 25  Ankle inversion 43 11  Ankle eversion 28 5   (Blank rows = not tested)  LOWER EXTREMITY MMT: not tested due to surgical condition  MMT Right eval Left eval  Hip flexion    Hip extension    Hip abduction    Hip adduction    Hip internal rotation    Hip external rotation    Knee flexion    Knee extension    Ankle dorsiflexion    Ankle plantarflexion    Ankle inversion    Ankle eversion     (Blank rows = not tested)  GAIT: Assistive device utilized: Environmental consultant - 4 wheeled Level of assistance: Modified independence Comments: WBAT with CAM boot                                                                                                                                 TREATMENT DATE:                                     11/01/23 EXERCISE LOG  Exercise Repetitions and Resistance Comments  Nustep  L3 x 16 minutes @ seat 9   Manual therapy  See below                Blank cell = exercise not performed today  Manual Therapy Soft Tissue Mobilization: left gastrocnemius, for improved soft tissue extensibility Joint Mobilizations: left talocrural , grade I-II Passive ROM: left ankle, all planes to tolerance Modalities: no redness or adverse reaction to today's modalities  Date:  Vaso: Ankle, 34 degrees; low pressure, 13 mins, Pain and Edema  10/28/23 EXERCISE LOG  Exercise Repetitions and Resistance Comments  Nustep  L1 x 10 minutes @ seat 7   Toe curls  30 reps    Manual therapy  See below    L ankle AROM  20 reps each  Open chain; all planes       Blank cell = exercise not performed today  Manual Therapy Soft Tissue Mobilization: Left  gastrocnemius, soleus, anterior tibialis, and fibularis longus, for improved soft tissue extensibility Passive ROM: Left ankle, all planes to tolerance  Modalities: no adverse reaction to today's modalities  Date:  Vaso: Ankle, 34 degrees; low pressure, 15 mins, Pain and Edema  PATIENT EDUCATION:  Education details: healing, surgical condition, desensitization  Person educated: Patient Education method: Explanation Education comprehension: verbalized understanding  HOME EXERCISE PROGRAM:   ASSESSMENT:  CLINICAL IMPRESSION: Patient was progressed with familiar interventions for improved left ankle mobility. She was educated on methods for desensitization to reduce her sensitivity to light touch to improve her ability to transition from the CAM boot to a closed toe shoe. She reported understanding. Manual therapy focused on soft tissue mobilization to her gastrocnemius, talocrural joint mobilizations, and passive range of motion with good results. She reported feeling better upon the conclusion of treatment. She continues to require skilled physical therapy to address her remaining impairments to return to her prior level of function.   OBJECTIVE IMPAIRMENTS: Abnormal gait, decreased activity tolerance, decreased mobility, difficulty walking, decreased ROM, decreased strength, hypomobility, increased edema, impaired flexibility, impaired sensation, impaired tone, and pain.   ACTIVITY LIMITATIONS: carrying, lifting, standing, squatting, sleeping, stairs, transfers, dressing, and locomotion level  PARTICIPATION LIMITATIONS: meal prep, cleaning, laundry, driving, shopping, community activity, and occupation  PERSONAL FACTORS: Past/current experiences, Time since onset of injury/illness/exacerbation, Transportation, and 3+ comorbidities: Epilepsy, fibromyalgia, history of seizures, chronic low back pain, and anxiety are also affecting patient's functional outcome.   REHAB POTENTIAL:  Good  CLINICAL DECISION MAKING: Evolving/moderate complexity  EVALUATION COMPLEXITY: Moderate   GOALS: Goals reviewed with patient? Yes  SHORT TERM GOALS: Target date: 11/23/23 Patient will be independent with her initial HEP. Baseline: Goal status: INITIAL  2.  Patient will be able to complete her daily activities without her familiar pain exceeding 8/10. Baseline:  Goal status: INITIAL  3.  Patient will improve her active left ankle dorsiflexion to at least neutral for improved ankle mobility. Baseline:  Goal status: INITIAL  4.  Patient will improve her LEFS score to at least 30/80 for improved perceived function with her daily activities. Baseline:  Goal status: INITIAL  LONG TERM GOALS: Target date: 12/21/23  Patient will be independent with her advanced HEP. Baseline:  Goal status: INITIAL  2.  Patient will improve her active left ankle dorsiflexion to at least 5 degrees for improved gait mechanics. Baseline:  Goal status: INITIAL  3.  Patient will be able to demonstrate minimal to no significant gait deviations for improved functional mobility. Baseline:  Goal status: INITIAL  4.  Patient will improve her LEFS score to at least 45/80 for improved perceived function with her daily activities. Baseline:  Goal status: INITIAL  5.  Patient will be able to navigate at least 4 steps with a reciprocal pattern for improved household mobility. Baseline:  Goal status: INITIAL  6.  Patient will be able to return to her regular gym activities without being limited by her familiar left ankle symptoms. Baseline:  Goal status: INITIAL   PLAN:  PT FREQUENCY: 2x/week  PT DURATION: 8  weeks  PLANNED INTERVENTIONS: 97164- PT Re-evaluation, 97750- Physical Performance Testing, 97110-Therapeutic exercises, 97530- Therapeutic activity, [id number]- Neuromuscular re-education, [phone]- Self Care, 13086- Manual therapy, [phone]- Gait training, [phone]- Electrical stimulation (unattended),  97016- Vasopneumatic device, Patient/Family education, Balance training, Stair training, Joint mobilization, Cryotherapy, and Moist heat  PLAN FOR NEXT SESSION: Nustep, PROM, manual therapy, and modalities as needed   Lane Pinon, PT 11/01/2023, 5:43 PM

## 2023-11-02 ENCOUNTER — Encounter

## 2023-11-04 ENCOUNTER — Ambulatory Visit

## 2023-11-04 DIAGNOSIS — M25672 Stiffness of left ankle, not elsewhere classified: Secondary | ICD-10-CM

## 2023-11-04 DIAGNOSIS — M25572 Pain in left ankle and joints of left foot: Secondary | ICD-10-CM | POA: Diagnosis not present

## 2023-11-04 NOTE — Therapy (Signed)
 OUTPATIENT PHYSICAL THERAPY LOWER EXTREMITY TREATMENT   Patient Name: [name] MRN: [id number] DOB:04-Jun-1981, 43 y.o., female Today's Date: 11/04/2023  END OF SESSION:  PT End of Session - 11/04/23 1655     Visit Number 4    Number of Visits 16    Date for PT Re-Evaluation 01/07/24    PT Start Time 1645    PT Stop Time 1736    PT Time Calculation (min) 51 min    Activity Tolerance Patient tolerated treatment well    Behavior During Therapy WFL for tasks assessed/performed               Past Medical History:  Diagnosis Date   Abnormal Pap smear of cervix    Acne fulminans    Allergic rhinitis    Brain cyst    Breast nodule    Cervical polyp    Epilepsy (HCC)    Fibromyalgia    GERD (gastroesophageal reflux disease)    IBS (irritable bowel syndrome)    Kidney stones    Melanoma of hip (HCC)    Menorrhagia    Migraine headache    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HCC)    Previous stillbirth or demise, antepartum    Pulmonary nodule    previous evaluation - benign   Seizures (HCC)    recently diagnosed with epilepsy   Sepsis(995.91)    Shortness of breath    due to wt   Small bowel obstruction (HCC)    Staphylococcal infection    Past Surgical History:  Procedure Laterality Date   BREAST EXCISIONAL BIOPSY Left    caesarean section     HYSTEROSCOPY WITH D & C  04/27/2011   Procedure: DILATATION AND CURETTAGE (D&C) /HYSTEROSCOPY;  Surgeon: Johnn Najjar, MD;  Location: [facility];  Service: Gynecology;  Laterality: N/A;   MELANOMA EXCISION     svd     x 1 - stillborn at 19 weeks   Patient Active Problem List   Diagnosis Date Noted   Solitary pulmonary nodule 07/10/2018   Dysuria 07/08/2018   Morbid obesity due to excess calories (HCC) 06/25/2018   DOE (dyspnea on exertion) 06/20/2018   Chronic cough 06/20/2018   PALPITATIONS 06/12/2010   CHEST PAIN, PRECORDIAL 06/12/2010   Migraine headache 06/11/2010   IBS 06/11/2010   ALLERGIC RHINITIS 09/11/2009     PCP: Artie Laster, FNP  REFERRING PROVIDER: Velma Ghazi, DPM   REFERRING DIAG: Plantar fasciitis of left foot, Achilles tendinitis of left lower extremity, S/P foot surgery   THERAPY DIAG:  Pain in left ankle and joints of left foot  Stiffness of left ankle, not elsewhere classified  Rationale for Evaluation and Treatment: Rehabilitation  ONSET DATE: 09/20/23  SUBJECTIVE:   SUBJECTIVE STATEMENT: Pt states that "today has been a bad day."  Pt reports 4/10 left ankle/foot pain.  PERTINENT HISTORY: Epilepsy, fibromyalgia, history of seizures, chronic low back pain, and anxiety PAIN:  Are you having pain? Yes: NPRS scale: Current: 4/10 Best: 2/10 Worst: 10/10 Pain location: posterior ankle and plantar surface of the foot Pain description: constant dull  Aggravating factors: walking (30-45 minutes) anything touching her heel Relieving factors: heat  PRECAUTIONS: Other: Ankle  RED FLAGS: None   WEIGHT BEARING RESTRICTIONS: Yes WBAT  FALLS:  Has patient fallen in last 6 months? 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  PATIENT SURVEYS:  LEFS 18/80  COGNITION: Overall cognitive status: Within functional limits for tasks assessed     SENSATION: Patient reports no numbness or tingling  EDEMA:  Figure 8: Left: 58.5 cm Right: 57.5 cm   PALPATION: TTP: left gastroc/soleus, medial and lateral malleolus, plantar fascia  JOINT MOBILITY:   Left foot and ankle: hypomobile and painful   LOWER EXTREMITY ROM:  Active ROM Right eval  Left eval  Hip flexion    Hip extension    Hip abduction    Hip adduction    Hip internal rotation    Hip external rotation    Knee flexion    Knee extension    Ankle dorsiflexion 5 -5  Ankle plantarflexion 65 25  Ankle inversion 43 11  Ankle eversion 28 5   (Blank rows = not tested)  LOWER EXTREMITY MMT: not tested due to surgical condition  MMT Right eval Left eval  Hip flexion    Hip extension    Hip abduction    Hip adduction    Hip internal rotation    Hip external rotation    Knee flexion    Knee extension    Ankle dorsiflexion    Ankle plantarflexion    Ankle inversion    Ankle eversion     (Blank rows = not tested)  GAIT: Assistive device utilized: Environmental consultant - 4 wheeled Level of assistance: Modified independence Comments: WBAT with CAM boot                                                                                                                                 TREATMENT DATE:    11/04/23     EXERCISE LOG  Exercise Repetitions and Resistance Comments  Nustep  L3 x 13 minutes @ seat 7   Manual therapy  See below        Blank cell = exercise not performed today   Manual Therapy Soft Tissue Mobilization: left gastrocnemius, STW/M to left gastroc and medial left ankle to decrease pain and tone Passive ROM: left ankle, PROM into dorsiflexion and plantarflexion with gentle hold at end range to tolerancer Modalities: no redness or adverse reaction to today's modalities  Date:  Vaso: Ankle, 34 degrees; low pressure, 13 mins, Pain and Edema                                   10/28/23 EXERCISE LOG  Exercise Repetitions and Resistance Comments  Nustep  L1 x 10 minutes @ seat 7   Toe curls  30 reps    Manual therapy  See below  L ankle AROM  20 reps each  Open chain; all planes       Blank cell = exercise not performed today  Manual Therapy Soft Tissue Mobilization: Left gastrocnemius, soleus, anterior tibialis, and fibularis longus, for improved  soft tissue extensibility Passive ROM: Left ankle, all planes to tolerance  Modalities: no adverse reaction to today's modalities  Date:  Vaso: Ankle, 34 degrees; low pressure, 15 mins, Pain and Edema  PATIENT EDUCATION:  Education details: healing, surgical condition, desensitization  Person educated: Patient Education method: Explanation Education comprehension: verbalized understanding  HOME EXERCISE PROGRAM:   ASSESSMENT:  CLINICAL IMPRESSION: Pt arrives for today's treatment session reporting 4/10 left ankle pain.  Pt able to don left tennis shoe today for Nustep and ambulation around the facility.  Pt able to progress to seat 7 on the Nustep today with slight discomfort from tennis shoe.  STW/M and PROM performed to left ankle to increase ROM and function while decreasing pain and tone.  Normal responses to vaso noted upon removal.  Pt reported decreased pain at completion of today's treatment session.   OBJECTIVE IMPAIRMENTS: Abnormal gait, decreased activity tolerance, decreased mobility, difficulty walking, decreased ROM, decreased strength, hypomobility, increased edema, impaired flexibility, impaired sensation, impaired tone, and pain.   ACTIVITY LIMITATIONS: carrying, lifting, standing, squatting, sleeping, stairs, transfers, dressing, and locomotion level  PARTICIPATION LIMITATIONS: meal prep, cleaning, laundry, driving, shopping, community activity, and occupation  PERSONAL FACTORS: Past/current experiences, Time since onset of injury/illness/exacerbation, Transportation, and 3+ comorbidities: Epilepsy, fibromyalgia, history of seizures, chronic low back pain, and anxiety are also affecting patient's functional outcome.   REHAB POTENTIAL: Good  CLINICAL DECISION MAKING: Evolving/moderate complexity  EVALUATION COMPLEXITY: Moderate   GOALS: Goals reviewed with patient? Yes  SHORT TERM GOALS: Target date: 11/23/23 Patient will be independent with her initial  HEP. Baseline: Goal status: INITIAL  2.  Patient will be able to complete her daily activities without her familiar pain exceeding 8/10. Baseline:  Goal status: INITIAL  3.  Patient will improve her active left ankle dorsiflexion to at least neutral for improved ankle mobility. Baseline:  Goal status: INITIAL  4.  Patient will improve her LEFS score to at least 30/80 for improved perceived function with her daily activities. Baseline:  Goal status: INITIAL  LONG TERM GOALS: Target date: 12/21/23  Patient will be independent with her advanced HEP. Baseline:  Goal status: INITIAL  2.  Patient will improve her active left ankle dorsiflexion to at least 5 degrees for improved gait mechanics. Baseline:  Goal status: INITIAL  3.  Patient will be able to demonstrate minimal to no significant gait deviations for improved functional mobility. Baseline:  Goal status: INITIAL  4.  Patient will improve her LEFS score to at least 45/80 for improved perceived function with her daily activities. Baseline:  Goal status: INITIAL  5.  Patient will be able to navigate at least 4 steps with a reciprocal pattern for improved household mobility. Baseline:  Goal status: INITIAL  6.  Patient will be able to return to her regular gym activities without being limited by her familiar left ankle symptoms. Baseline:  Goal status: INITIAL  PLAN:  PT FREQUENCY: 2x/week  PT DURATION: 8 weeks  PLANNED INTERVENTIONS: 97164- PT Re-evaluation, 97750- Physical Performance Testing, 97110-Therapeutic exercises, 97530- Therapeutic activity, [id number]- Neuromuscular re-education, 97535- Self Care, [id number]- Manual therapy, [phone]- Gait training, [phone]- Electrical stimulation (unattended), 97016- Vasopneumatic device, Patient/Family education, Balance training, Stair training, Joint mobilization, Cryotherapy, and Moist heat  PLAN  FOR NEXT SESSION: Nustep, PROM, manual therapy, and modalities as needed  Deryl Flora, PTA 11/04/2023, 5:37 PM

## 2023-11-09 ENCOUNTER — Ambulatory Visit

## 2023-11-09 DIAGNOSIS — M25572 Pain in left ankle and joints of left foot: Secondary | ICD-10-CM

## 2023-11-09 DIAGNOSIS — M25672 Stiffness of left ankle, not elsewhere classified: Secondary | ICD-10-CM

## 2023-11-09 NOTE — Therapy (Signed)
 OUTPATIENT PHYSICAL THERAPY LOWER EXTREMITY TREATMENT   Patient Name: [name] MRN: [id number] DOB:1981/06/03, 43 y.o., female Today's Date: 11/09/2023  END OF SESSION:  PT End of Session - 11/09/23 1605     Visit Number 5    Number of Visits 16    Date for PT Re-Evaluation 01/07/24    PT Start Time 1600    Activity Tolerance Patient tolerated treatment well    Behavior During Therapy [facility] for tasks assessed/performed               Past Medical History:  Diagnosis Date   Abnormal Pap smear of cervix    Acne fulminans    Allergic rhinitis    Brain cyst    Breast nodule    Cervical polyp    Epilepsy (HCC)    Fibromyalgia    GERD (gastroesophageal reflux disease)    IBS (irritable bowel syndrome)    Kidney stones    Melanoma of hip (HCC)    Menorrhagia    Migraine headache    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HCC)    Previous stillbirth or demise, antepartum    Pulmonary nodule    previous evaluation - benign   Seizures (HCC)    recently diagnosed with epilepsy   Sepsis(995.91)    Shortness of breath    due to wt   Small bowel obstruction (HCC)    Staphylococcal infection    Past Surgical History:  Procedure Laterality Date   BREAST EXCISIONAL BIOPSY Left    caesarean section     HYSTEROSCOPY WITH D & C  04/27/2011   Procedure: DILATATION AND CURETTAGE (D&C) /HYSTEROSCOPY;  Surgeon: Johnn Najjar, MD;  Location: [facility];  Service: Gynecology;  Laterality: N/A;   MELANOMA EXCISION     svd     x 1 - stillborn at 35 weeks   Patient Active Problem List   Diagnosis Date Noted   Solitary pulmonary nodule 07/10/2018   Dysuria 07/08/2018   Morbid obesity due to excess calories (HCC) 06/25/2018   DOE (dyspnea on exertion) 06/20/2018   Chronic cough 06/20/2018   PALPITATIONS 06/12/2010   CHEST PAIN, PRECORDIAL 06/12/2010   Migraine headache 06/11/2010   IBS 06/11/2010   ALLERGIC RHINITIS 09/11/2009    PCP: Artie Laster, FNP  REFERRING PROVIDER: Velma Ghazi, DPM   REFERRING DIAG: Plantar fasciitis of left foot, Achilles tendinitis of left lower extremity, S/P foot surgery   THERAPY DIAG:  Pain in left ankle and joints of left foot  Stiffness of left ankle, not elsewhere classified  Rationale for Evaluation and Treatment: Rehabilitation  ONSET DATE: 09/20/23  SUBJECTIVE:   SUBJECTIVE STATEMENT: Pt reports 5/10 left ankle pain with increased swelling today.    PERTINENT HISTORY: Epilepsy, fibromyalgia, history of seizures, chronic low back pain, and anxiety  PAIN:  Are you having pain? Yes: NPRS scale: 5/10 Pain location: posterior ankle and plantar surface of the foot Pain description: constant dull  Aggravating factors: walking (30-45 minutes) anything touching her heel Relieving factors: heat  PRECAUTIONS: Other: Ankle  RED FLAGS: None   WEIGHT BEARING RESTRICTIONS: Yes WBAT  FALLS:  Has patient fallen in last 6 months? 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  PATIENT SURVEYS:  LEFS 18/80  COGNITION: Overall cognitive status: Within functional limits for tasks assessed     SENSATION: Patient reports no numbness or tingling  EDEMA:  Figure 8: Left: 58.5 cm Right: 57.5 cm   PALPATION: TTP: left gastroc/soleus, medial and lateral malleolus, plantar fascia  JOINT MOBILITY:   Left foot and ankle: hypomobile and painful   LOWER EXTREMITY ROM:  Active ROM Right eval Left eval  Hip flexion    Hip extension    Hip abduction    Hip adduction    Hip internal rotation    Hip  external rotation    Knee flexion    Knee extension    Ankle dorsiflexion 5 -5  Ankle plantarflexion 65 25  Ankle inversion 43 11  Ankle eversion 28 5   (Blank rows = not tested)  LOWER EXTREMITY MMT: not tested due to surgical condition  MMT Right eval Left eval  Hip flexion    Hip extension    Hip abduction    Hip adduction    Hip internal rotation    Hip external rotation    Knee flexion    Knee extension    Ankle dorsiflexion    Ankle plantarflexion    Ankle inversion    Ankle eversion     (Blank rows = not tested)  GAIT: Assistive device utilized: Environmental consultant - 4 wheeled Level of assistance: Modified independence Comments: WBAT with CAM boot                                                                                                                                 TREATMENT DATE:    11/09/23     EXERCISE LOG  Exercise Repetitions and Resistance Comments  Nustep  L3 x 10 mins w tennis shoe, 10 mins w no shoe   Manual therapy  See below        Blank cell = exercise not performed today   Manual Therapy Soft Tissue Mobilization: left gastrocnemius, STW/M to left gastroc and medial left ankle to decrease pain and tone Passive ROM: left ankle, PROM into dorsiflexion and plantarflexion with gentle hold at end range to tolerancer Modalities: no redness or adverse reaction to today's modalities  Date:  Unattended Estim: Ankle, IFC 80-150 Hz, 15 mins, Pain and Edema Vaso: Ankle, 34 degrees; low pressure, 15 mins, Pain and Edema                                   10/28/23 EXERCISE LOG  Exercise Repetitions and Resistance Comments  Nustep  L1 x 10 minutes @ seat 7   Toe curls  30 reps    Manual therapy  See below    L ankle AROM  20 reps each  Open chain; all planes       Blank cell = exercise not performed today  Manual Therapy Soft Tissue Mobilization: Left gastrocnemius, soleus, anterior tibialis, and fibularis longus, for improved soft tissue  extensibility Passive ROM: Left ankle, all planes to tolerance  Modalities: no adverse reaction to today's modalities  Date:  Vaso: Ankle, 34 degrees; low pressure, 15 mins, Pain and Edema  PATIENT EDUCATION:  Education details: healing, surgical condition, desensitization  Person educated: Patient Education method: Explanation Education comprehension: verbalized understanding  HOME EXERCISE PROGRAM:   ASSESSMENT:  CLINICAL IMPRESSION: Pt arrives for today's treatment session reporting 5/10 left ankle pain.  Pt able to tolerate 10 mins on Nustep with her tennis shoe on and 10 mins without her tennis shoe.  Pt with continued sensitivity on left heel region.  STW/M and PROM performed to left ankle to decrease pain, tone, and edema.  Normal responses to estim and vaso noted upon removal.  Pt reported decreased pain at completion of today's treatment session.   OBJECTIVE IMPAIRMENTS: Abnormal gait, decreased activity tolerance, decreased mobility, difficulty walking, decreased ROM, decreased strength, hypomobility, increased edema, impaired flexibility, impaired sensation, impaired tone, and pain.   ACTIVITY LIMITATIONS: carrying, lifting, standing, squatting, sleeping, stairs, transfers, dressing, and locomotion level  PARTICIPATION LIMITATIONS: meal prep, cleaning, laundry, driving, shopping, community activity, and occupation  PERSONAL FACTORS: Past/current experiences, Time since onset of injury/illness/exacerbation, Transportation, and 3+ comorbidities: Epilepsy, fibromyalgia, history of seizures, chronic low back pain, and anxiety are also affecting patient's functional outcome.   REHAB POTENTIAL: Good  CLINICAL DECISION MAKING: Evolving/moderate complexity  EVALUATION COMPLEXITY: Moderate   GOALS: Goals reviewed with patient? Yes  SHORT TERM GOALS: Target date: 11/23/23 Patient will be independent with her initial HEP. Baseline: Goal status: INITIAL  2.  Patient will be  able to complete her daily activities without her familiar pain exceeding 8/10. Baseline:  Goal status: INITIAL  3.  Patient will improve her active left ankle dorsiflexion to at least neutral for improved ankle mobility. Baseline:  Goal status: INITIAL  4.  Patient will improve her LEFS score to at least 30/80 for improved perceived function with her daily activities. Baseline:  Goal status: INITIAL  LONG TERM GOALS: Target date: 12/21/23  Patient will be independent with her advanced HEP. Baseline:  Goal status: INITIAL  2.  Patient will improve her active left ankle dorsiflexion to at least 5 degrees for improved gait mechanics. Baseline:  Goal status: INITIAL  3.  Patient will be able to demonstrate minimal to no significant gait deviations for improved functional mobility. Baseline:  Goal status: INITIAL  4.  Patient will improve her LEFS score to at least 45/80 for improved perceived function with her daily activities. Baseline:  Goal status: INITIAL  5.  Patient will be able to navigate at least 4 steps with a reciprocal pattern for improved household mobility. Baseline:  Goal status: INITIAL  6.  Patient will be able to return to her regular gym activities without being limited by her familiar left ankle symptoms. Baseline:  Goal status: INITIAL  PLAN:  PT FREQUENCY: 2x/week  PT DURATION: 8 weeks  PLANNED INTERVENTIONS: 97164- PT Re-evaluation, 97750- Physical Performance Testing, 97110-Therapeutic exercises, 97530- Therapeutic activity, [id number]- Neuromuscular re-education, 97535- Self Care, 16109- Manual therapy, [phone]- Gait training, [phone]- Electrical stimulation (unattended), 97016- Vasopneumatic device, Patient/Family education, Balance training, Stair training, Joint mobilization, Cryotherapy, and Moist heat  PLAN FOR NEXT SESSION: Nustep, PROM, manual therapy, and modalities as  needed  Deryl Flora, PTA 11/09/2023, 5:31 PM

## 2023-11-10 NOTE — Telephone Encounter (Signed)
 Pt sent note that [employer] forms were not recd. I sent mess to pt via MyChart they were faxed on 10/25/23 to [phone] and confirmation sheet adv it went thru successfully (14 pages). I adv pt that I would email them as well per her req to [email or web address] and include her Id# [id number].

## 2023-11-11 ENCOUNTER — Ambulatory Visit: Admitting: *Deleted

## 2023-11-11 NOTE — Telephone Encounter (Signed)
 Per pt req emailed to [email or web address] the forms for [employer]. I advised her emailed yesterday as well per her request to [email or web address] and sent mess via MyChart would email her today.

## 2023-11-16 ENCOUNTER — Ambulatory Visit: Attending: Podiatry

## 2023-11-16 DIAGNOSIS — M25672 Stiffness of left ankle, not elsewhere classified: Secondary | ICD-10-CM | POA: Insufficient documentation

## 2023-11-16 DIAGNOSIS — M25572 Pain in left ankle and joints of left foot: Secondary | ICD-10-CM | POA: Insufficient documentation

## 2023-11-16 NOTE — Therapy (Signed)
 OUTPATIENT PHYSICAL THERAPY LOWER EXTREMITY TREATMENT   Patient Name: [name] MRN: [id number] DOB:11-May-1981, 43 y.o., female Today's Date: 11/16/2023  END OF SESSION:  PT End of Session - 11/16/23 1614     Visit Number 6    Number of Visits 16    Date for PT Re-Evaluation 01/07/24    PT Start Time 1605    PT Stop Time 1713    PT Time Calculation (min) 68 min    Activity Tolerance Patient tolerated treatment well    Behavior During Therapy WFL for tasks assessed/performed               Past Medical History:  Diagnosis Date   Abnormal Pap smear of cervix    Acne fulminans    Allergic rhinitis    Brain cyst    Breast nodule    Cervical polyp    Epilepsy (HCC)    Fibromyalgia    GERD (gastroesophageal reflux disease)    IBS (irritable bowel syndrome)    Kidney stones    Melanoma of hip (HCC)    Menorrhagia    Migraine headache    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HCC)    Previous stillbirth or demise, antepartum    Pulmonary nodule    previous evaluation - benign   Seizures (HCC)    recently diagnosed with epilepsy   Sepsis(995.91)    Shortness of breath    due to wt   Small bowel obstruction (HCC)    Staphylococcal infection    Past Surgical History:  Procedure Laterality Date   BREAST EXCISIONAL BIOPSY Left    caesarean section     HYSTEROSCOPY WITH D & C  04/27/2011   Procedure: DILATATION AND CURETTAGE (D&C) /HYSTEROSCOPY;  Surgeon: Johnn Najjar, MD;  Location: [facility];  Service: Gynecology;  Laterality: N/A;   MELANOMA EXCISION     svd     x 1 - stillborn at 83 weeks   Patient Active Problem List   Diagnosis Date Noted   Solitary pulmonary nodule 07/10/2018   Dysuria 07/08/2018   Morbid obesity due to excess calories (HCC) 06/25/2018   DOE (dyspnea on exertion) 06/20/2018   Chronic cough 06/20/2018   PALPITATIONS 06/12/2010   CHEST PAIN, PRECORDIAL 06/12/2010   Migraine headache 06/11/2010   IBS 06/11/2010   ALLERGIC RHINITIS 09/11/2009     PCP: Artie Laster, FNP  REFERRING PROVIDER: Velma Ghazi, DPM   REFERRING DIAG: Plantar fasciitis of left foot, Achilles tendinitis of left lower extremity, S/P foot surgery   THERAPY DIAG:  Pain in left ankle and joints of left foot  Stiffness of left ankle, not elsewhere classified  Rationale for Evaluation and Treatment: Rehabilitation  ONSET DATE: 09/20/23  SUBJECTIVE:   SUBJECTIVE STATEMENT: Pt reports 4/10 left ankle pain today. Pt has worn her right tennis shoe all day today.  Pt able to ambulate into the facility without AD today.    PERTINENT HISTORY: Epilepsy, fibromyalgia, history of seizures, chronic low back pain, and anxiety  PAIN:  Are you having pain? Yes: NPRS scale: 4/10 Pain location: posterior ankle and plantar surface of the foot Pain description: constant dull  Aggravating factors: walking (30-45 minutes) anything touching her heel Relieving factors: heat  PRECAUTIONS: Other: Ankle  RED FLAGS: None   WEIGHT BEARING RESTRICTIONS: Yes WBAT  FALLS:  Has patient fallen in last 6 months? 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  PATIENT SURVEYS:  LEFS 18/80  COGNITION: Overall cognitive status: Within functional limits for tasks assessed     SENSATION: Patient reports no numbness or tingling  EDEMA:  Figure 8: Left: 58.5 cm Right: 57.5 cm   PALPATION: TTP: left gastroc/soleus, medial and lateral malleolus, plantar fascia  JOINT MOBILITY:   Left foot and ankle: hypomobile and painful   LOWER EXTREMITY  ROM:  Active ROM Right eval Left eval  Hip flexion    Hip extension    Hip abduction    Hip adduction    Hip internal rotation    Hip external rotation    Knee flexion    Knee extension    Ankle dorsiflexion 5 -5  Ankle plantarflexion 65 25  Ankle inversion 43 11  Ankle eversion 28 5   (Blank rows = not tested)  LOWER EXTREMITY MMT: not tested due to surgical condition  MMT Right eval Left eval  Hip flexion    Hip extension    Hip abduction    Hip adduction    Hip internal rotation    Hip external rotation    Knee flexion    Knee extension    Ankle dorsiflexion    Ankle plantarflexion    Ankle inversion    Ankle eversion     (Blank rows = not tested)  GAIT: Assistive device utilized: Environmental consultant - 4 wheeled Level of assistance: Modified independence Comments: WBAT with CAM boot                                                                                                                                 TREATMENT DATE:    11/16/23     EXERCISE LOG  Exercise Repetitions and Resistance Comments  Nustep  L2 x 10 mins w tennis shoe   Rockerboard Forward/backward and lateral x 2 mins each   Standing Marches Airex x 2 mins   4way Ankle Exercises Yellow x 20 reps each   Manual therapy  See below        Blank cell = exercise not performed today   Manual Therapy Soft Tissue Mobilization: left gastrocnemius, STW/M to left gastroc and medial left ankle to decrease pain and tone Passive ROM: left ankle, PROM into dorsiflexion and plantarflexion with gentle hold at end range to tolerancer Modalities: no redness or adverse reaction to today's modalities  Date:  Unattended Estim: Ankle, IFC 80-150 Hz, 15 mins, Pain and Edema Vaso: Ankle, 34 degrees; low pressure, 15 mins, Pain and Edema  10/28/23 EXERCISE LOG  Exercise Repetitions and Resistance Comments  Nustep  L1 x 10 minutes @ seat 7   Toe curls  30 reps    Manual therapy  See  below    L ankle AROM  20 reps each  Open chain; all planes       Blank cell = exercise not performed today  Manual Therapy Soft Tissue Mobilization: Left gastrocnemius, soleus, anterior tibialis, and fibularis longus, for improved soft tissue extensibility Passive ROM: Left ankle, all planes to tolerance  Modalities: no adverse reaction to today's modalities  Date:  Vaso: Ankle, 34 degrees; low pressure, 15 mins, Pain and Edema  PATIENT EDUCATION:  Education details: healing, surgical condition, desensitization  Person educated: Patient Education method: Explanation Education comprehension: verbalized understanding  HOME EXERCISE PROGRAM:   ASSESSMENT:  CLINICAL IMPRESSION: Pt arrives for today's treatment session reporting 4/10 left ankle pain.  Pt states that she has worn her tennis shoe all day today for the first time.  Pt also ambulates into the facility without use of AD.  Pt able to tolerate standing rockerboard today with slight discomfort.  Pt instructed in 4-way ankle exercises with min cues for proper technique and to avoid knee movements.  STW/M performed to medial aspect of left ankle to decrease pain and tone.  Pt also introduced to scar massage with pt able to demonstrate understanding.  Normal responses to estim and vaso noted upon removal.  Pt reported decreased pain at completion of today's treatment session.   OBJECTIVE IMPAIRMENTS: Abnormal gait, decreased activity tolerance, decreased mobility, difficulty walking, decreased ROM, decreased strength, hypomobility, increased edema, impaired flexibility, impaired sensation, impaired tone, and pain.   ACTIVITY LIMITATIONS: carrying, lifting, standing, squatting, sleeping, stairs, transfers, dressing, and locomotion level  PARTICIPATION LIMITATIONS: meal prep, cleaning, laundry, driving, shopping, community activity, and occupation  PERSONAL FACTORS: Past/current experiences, Time since onset of  injury/illness/exacerbation, Transportation, and 3+ comorbidities: Epilepsy, fibromyalgia, history of seizures, chronic low back pain, and anxiety are also affecting patient's functional outcome.   REHAB POTENTIAL: Good  CLINICAL DECISION MAKING: Evolving/moderate complexity  EVALUATION COMPLEXITY: Moderate   GOALS: Goals reviewed with patient? Yes  SHORT TERM GOALS: Target date: 11/23/23 Patient will be independent with her initial HEP. Baseline: Goal status: INITIAL  2.  Patient will be able to complete her daily activities without her familiar pain exceeding 8/10. Baseline:  Goal status: INITIAL  3.  Patient will improve her active left ankle dorsiflexion to at least neutral for improved ankle mobility. Baseline:  Goal status: INITIAL  4.  Patient will improve her LEFS score to at least 30/80 for improved perceived function with her daily activities. Baseline:  Goal status: INITIAL  LONG TERM GOALS: Target date: 12/21/23  Patient will be independent with her advanced HEP. Baseline:  Goal status: INITIAL  2.  Patient will improve her active left ankle dorsiflexion to at least 5 degrees for improved gait mechanics. Baseline:  Goal status: INITIAL  3.  Patient will be able to demonstrate minimal to no significant gait deviations for improved functional mobility. Baseline:  Goal status: INITIAL  4.  Patient will improve her LEFS score to at least 45/80 for improved perceived function with her daily activities. Baseline:  Goal status: INITIAL  5.  Patient will be able to navigate at least 4 steps with a reciprocal pattern for improved household mobility. Baseline:  Goal status: INITIAL  6.  Patient will be able to return to her regular gym activities without being  limited by her familiar left ankle symptoms. Baseline:  Goal status: INITIAL  PLAN:  PT FREQUENCY: 2x/week  PT DURATION: 8 weeks  PLANNED INTERVENTIONS: 16109- PT Re-evaluation, 97750- Physical  Performance Testing, 97110-Therapeutic exercises, 97530- Therapeutic activity, 97112- Neuromuscular re-education, 97535- Self Care, 60454- Manual therapy, [phone]- Gait training, [phone]- Electrical stimulation (unattended), 97016- Vasopneumatic device, Patient/Family education, Balance training, Stair training, Joint mobilization, Cryotherapy, and Moist heat  PLAN FOR NEXT SESSION: Nustep, PROM, manual therapy, and modalities as needed  Deryl Flora, PTA 11/16/2023, 5:36 PM

## 2023-11-17 ENCOUNTER — Ambulatory Visit (INDEPENDENT_AMBULATORY_CARE_PROVIDER_SITE_OTHER): Admitting: Podiatry

## 2023-11-17 ENCOUNTER — Encounter: Payer: Self-pay | Admitting: Podiatry

## 2023-11-17 DIAGNOSIS — K219 Gastro-esophageal reflux disease without esophagitis: Secondary | ICD-10-CM | POA: Insufficient documentation

## 2023-11-17 DIAGNOSIS — R194 Change in bowel habit: Secondary | ICD-10-CM | POA: Insufficient documentation

## 2023-11-17 DIAGNOSIS — R1032 Left lower quadrant pain: Secondary | ICD-10-CM | POA: Insufficient documentation

## 2023-11-17 DIAGNOSIS — R152 Fecal urgency: Secondary | ICD-10-CM | POA: Insufficient documentation

## 2023-11-17 DIAGNOSIS — M722 Plantar fascial fibromatosis: Secondary | ICD-10-CM

## 2023-11-17 DIAGNOSIS — R141 Gas pain: Secondary | ICD-10-CM | POA: Insufficient documentation

## 2023-11-17 DIAGNOSIS — K625 Hemorrhage of anus and rectum: Secondary | ICD-10-CM | POA: Insufficient documentation

## 2023-11-17 DIAGNOSIS — R112 Nausea with vomiting, unspecified: Secondary | ICD-10-CM | POA: Insufficient documentation

## 2023-11-17 DIAGNOSIS — M7662 Achilles tendinitis, left leg: Secondary | ICD-10-CM

## 2023-11-17 DIAGNOSIS — Z1211 Encounter for screening for malignant neoplasm of colon: Secondary | ICD-10-CM | POA: Insufficient documentation

## 2023-11-17 DIAGNOSIS — R569 Unspecified convulsions: Secondary | ICD-10-CM | POA: Insufficient documentation

## 2023-11-17 DIAGNOSIS — Z9889 Other specified postprocedural states: Secondary | ICD-10-CM

## 2023-11-17 DIAGNOSIS — K5730 Diverticulosis of large intestine without perforation or abscess without bleeding: Secondary | ICD-10-CM | POA: Insufficient documentation

## 2023-11-17 NOTE — Progress Notes (Unsigned)
 Subjective:  Patient ID: [name], female    DOB: 01/15/1981,  MRN: [id number]  Chief Complaint  Patient presents with   Plantar Fasciitis    Pt stated that she is doing okay she stated that physical therapy is going well     DOS: 09/20/2023 Procedure: Left gastrocnemius recession with Achilles repair with Haglund's resection and endoscopic plantar fasciotomy  43 y.o. female returns for post-op check.  Patient states that she is doing well minimal complaints conitnue WBAT in cam boot then regular shoes   Denies nausea fever chills vomiting.  Bandages clean dry and intact  Review of Systems: Negative except as noted in the HPI. Denies N/V/F/Ch.  Past Medical History:  Diagnosis Date   Abnormal Pap smear of cervix    Acne fulminans    Allergic rhinitis    Brain cyst    Breast nodule    Cervical polyp    Epilepsy (HCC)    Fibromyalgia    GERD (gastroesophageal reflux disease)    IBS (irritable bowel syndrome)    Kidney stones    Melanoma of hip (HCC)    Menorrhagia    Migraine headache    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HCC)    Previous stillbirth or demise, antepartum    Pulmonary nodule    previous evaluation - benign   Seizures (HCC)    recently diagnosed with epilepsy   Sepsis(995.91)    Shortness of breath    due to wt   Small bowel obstruction (HCC)    Staphylococcal infection     Current Outpatient Medications:    lidocaine  (LIDODERM ) 5 %, See Admin Instructions. PLEASE SEE ATTACHED FOR DETAILED DIRECTIONS, Disp: , Rfl:    acetaminophen  (TYLENOL ) 500 MG tablet, Take 1,000 mg by mouth., Disp: , Rfl:    cyclobenzaprine  (FLEXERIL ) 10 MG tablet, Take 1 tablet (10 mg total) by mouth 3 (three) times daily as needed for muscle spasms., Disp: 30 tablet, Rfl: 0   cyclobenzaprine  (FLEXERIL ) 5 MG tablet, Take 5 mg by mouth 3 (three) times daily as needed., Disp: , Rfl:    diclofenac Sodium (VOLTAREN) 1 % GEL, Apply 2 g topically 4 (four) times daily., Disp: , Rfl:     drospirenone-ethinyl estradiol (VESTURA) 3-0.02 MG tablet, , Disp: , Rfl:    escitalopram (LEXAPRO) 20 MG tablet, Take 20 mg by mouth daily., Disp: , Rfl:    gabapentin  (NEURONTIN ) 100 MG capsule, Take 100 mg by mouth 3 (three) times daily., Disp: , Rfl:    ketorolac  (TORADOL ) 10 MG tablet, Take 1 tablet (10 mg total) by mouth every 6 (six) hours as needed., Disp: 20 tablet, Rfl: 0   levonorgestrel (MIRENA) 20 MCG/DAY IUD, by Intrauterine route., Disp: , Rfl:    meclizine  (ANTIVERT ) 25 MG tablet, Take 1 tablet (25 mg total) by mouth 3 (three) times daily as needed for dizziness., Disp: 30 tablet, Rfl: 0   meloxicam  (MOBIC ) 15 MG tablet, Take 15 mg by mouth daily. (Patient not taking: Reported on 10/29/2023), Disp: , Rfl:    oxyCODONE -acetaminophen  (PERCOCET) 5-325 MG tablet, Take 1 tablet by mouth every 4 (four) hours as needed for severe pain (pain score 7-10)., Disp: 30 tablet, Rfl: 0   oxyCODONE -acetaminophen  (PERCOCET) 5-325 MG tablet, Take 1 tablet by mouth every 4 (four) hours as needed for severe pain (pain score 7-10)., Disp: 30 tablet, Rfl: 0   traZODone (DESYREL) 50 MG tablet, Take 1 tablet by mouth at bedtime., Disp: , Rfl:    WEGOVY  0.25  MG/0.5ML SOAJ, Inject 0.5 mLs (0.25 mg dose) into the skin once a week at 0900 for 4 doses. (Patient not taking: Reported on 10/29/2023), Disp: 2 mL, Rfl: 0   zonisamide (ZONEGRAN) 50 MG capsule, Take 100 mg by mouth 2 (two) times daily. (Patient not taking: Reported on 10/29/2023), Disp: , Rfl:   Social History   Tobacco Use  Smoking Status Never  Smokeless Tobacco Never    Allergies  Allergen Reactions   Sulfonamide Derivatives Hives, Other (See Comments) and Swelling    Other Reaction(s): Unknown   Duloxetine     Other Reaction(s): Other  Rectal bleeding    Bleeding  Rectal bleeding  Bleeding    Rectal bleeding  Bleeding  Rectal bleeding  Bleeding   Penicillins Other (See Comments)   Amitriptyline Nausea And Vomiting   Cymbalta  [Duloxetine Hcl]     Rectal bleeding   Objective:  There were no vitals filed for this visit. There is no height or weight on file to calculate BMI. Constitutional Well developed. Well nourished.  Vascular Foot warm and well perfused. Capillary refill normal to all digits.   Neurologic Normal speech. Oriented to person, place, and time. Epicritic sensation to light touch grossly present bilaterally.  Dermatologic Skin completely epithelialized.  No signs of dehiscence noted no complication noted  Orthopedic: Tenderness to palpation noted about the surgical site.   Radiographs: None Assessment:   No diagnosis found.  Plan:  Patient was evaluated and treated and all questions answered.  S/p foot surgery left -Progressing as expected post-operatively. -XR: See above -WB Status: Slowly begin weightbearing as tolerated left lower extremity with Cam boot -Sutures: Removed no signs of dehiscence noted no complication noted -Medications: None - Continue physical therapy for another 6 week. And extend disability for another 6 weeks.   No follow-ups on file.

## 2023-11-18 ENCOUNTER — Ambulatory Visit

## 2023-11-18 DIAGNOSIS — M25672 Stiffness of left ankle, not elsewhere classified: Secondary | ICD-10-CM

## 2023-11-18 DIAGNOSIS — M25572 Pain in left ankle and joints of left foot: Secondary | ICD-10-CM

## 2023-11-18 NOTE — Therapy (Signed)
 OUTPATIENT PHYSICAL THERAPY LOWER EXTREMITY TREATMENT   Patient Name: [name] MRN: [id number] DOB:09/12/1980, 43 y.o., female Today's Date: 11/18/2023  END OF SESSION:  PT End of Session - 11/18/23 1604     Visit Number 7    Number of Visits 16    Date for PT Re-Evaluation 01/07/24    PT Start Time 1600    PT Stop Time 1659    PT Time Calculation (min) 59 min    Activity Tolerance Patient tolerated treatment well    Behavior During Therapy WFL for tasks assessed/performed               Past Medical History:  Diagnosis Date   Abnormal Pap smear of cervix    Acne fulminans    Allergic rhinitis    Brain cyst    Breast nodule    Cervical polyp    Epilepsy (HCC)    Fibromyalgia    GERD (gastroesophageal reflux disease)    IBS (irritable bowel syndrome)    Kidney stones    Melanoma of hip (HCC)    Menorrhagia    Migraine headache    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HCC)    Previous stillbirth or demise, antepartum    Pulmonary nodule    previous evaluation - benign   Seizures (HCC)    recently diagnosed with epilepsy   Sepsis(995.91)    Shortness of breath    due to wt   Small bowel obstruction (HCC)    Staphylococcal infection    Past Surgical History:  Procedure Laterality Date   BREAST EXCISIONAL BIOPSY Left    caesarean section     HYSTEROSCOPY WITH D & C  04/27/2011   Procedure: DILATATION AND CURETTAGE (D&C) /HYSTEROSCOPY;  Surgeon: Johnn Najjar, MD;  Location: [facility];  Service: Gynecology;  Laterality: N/A;   MELANOMA EXCISION     svd     x 1 - stillborn at 57 weeks   Patient Active Problem List   Diagnosis Date Noted   Change in bowel habit 11/17/2023   Diverticular disease of colon 11/17/2023   Colon cancer screening 11/17/2023   Fecal urgency 11/17/2023   Flatulence, eructation and gas pain 11/17/2023   Acid reflux 11/17/2023   Hemorrhage of rectum and anus 11/17/2023   Rectal bleeding 11/17/2023   Left lower quadrant pain 11/17/2023    Nausea and vomiting 11/17/2023   Seizures (HCC) 11/17/2023   Vertigo 11/01/2023   Left foot pain 06/25/2023   Lumbar facet joint syndrome 06/25/2023   Migraine without aura and without status migrainosus, not intractable 04/07/2023   Seizure disorder, complex partial (HCC) 04/07/2023   Chronic midline low back pain without sciatica 10/16/2022   Myofascial pain syndrome, cervical 10/16/2022   History of colonic polyps 01/16/2022   Encounter for IUD insertion 09/25/2021   High grade squamous intraepithelial lesion (HGSIL), grade 3 CIN, on biopsy of cervix 09/25/2021   IUD (intrauterine device) in place 09/25/2021   Mixed hyperlipidemia 08/21/2021   Prediabetes 08/21/2021   Metabolic syndrome 08/15/2021   HPV in female 05/07/2021   Nerve pain 07/06/2020   Epilepsy (HCC) 03/07/2020   Solitary pulmonary nodule 07/10/2018   Dysuria 07/08/2018   Morbid obesity due to excess calories (HCC) 06/25/2018   DOE (dyspnea on exertion) 06/20/2018   Chronic cough 06/20/2018   GAD (generalized anxiety disorder) 10/19/2017   Insomnia due to other mental disorder 10/19/2017   Vitamin D deficiency 07/19/2015   Celiac disease 04/03/2014   PALPITATIONS 06/12/2010  CHEST PAIN, PRECORDIAL 06/12/2010   Migraine headache 06/11/2010   IBS 06/11/2010   ALLERGIC RHINITIS 09/11/2009    PCP: Artie Laster, FNP  REFERRING PROVIDER: Velma Ghazi, DPM   REFERRING DIAG: Plantar fasciitis of left foot, Achilles tendinitis of left lower extremity, S/P foot surgery   THERAPY DIAG:  Pain in left ankle and joints of left foot  Stiffness of left ankle, not elsewhere classified  Rationale for Evaluation and Treatment: Rehabilitation  ONSET DATE: 09/20/23  SUBJECTIVE:   SUBJECTIVE STATEMENT: Pt reports 6/10 left ankle pain today.  Pt reports that doctor would like for her to push her ankle more.    PERTINENT HISTORY: Epilepsy, fibromyalgia, history of seizures, chronic low back pain, and  anxiety  PAIN:  Are you having pain? Yes: NPRS scale: 6/10 Pain location: posterior ankle and plantar surface of the foot Pain description: constant dull  Aggravating factors: walking (30-45 minutes) anything touching her heel Relieving factors: heat  PRECAUTIONS: Other: Ankle  RED FLAGS: None   WEIGHT BEARING RESTRICTIONS: Yes WBAT  FALLS:  Has patient fallen in last 6 months? 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  PATIENT SURVEYS:  LEFS 18/80  COGNITION: Overall cognitive status: Within functional limits for tasks assessed     SENSATION: Patient reports no numbness or tingling  EDEMA:  Figure 8: Left: 58.5 cm Right: 57.5 cm   PALPATION: TTP: left gastroc/soleus, medial and lateral malleolus, plantar fascia  JOINT MOBILITY:   Left foot and ankle: hypomobile and painful   LOWER EXTREMITY ROM:  Active ROM Right eval Left eval  Hip flexion    Hip extension    Hip abduction    Hip adduction    Hip internal rotation    Hip external rotation    Knee flexion    Knee extension    Ankle dorsiflexion 5 -5  Ankle plantarflexion 65 25  Ankle inversion 43 11  Ankle eversion 28 5   (Blank rows = not tested)  LOWER EXTREMITY MMT: not tested due to surgical condition  MMT Right eval Left eval  Hip flexion    Hip extension    Hip abduction    Hip adduction    Hip internal rotation    Hip external rotation    Knee flexion    Knee extension    Ankle dorsiflexion    Ankle plantarflexion    Ankle inversion    Ankle eversion      (Blank rows = not tested)  GAIT: Assistive device utilized: Walker - 4 wheeled Level of assistance: Modified independence Comments: WBAT with CAM boot  TREATMENT DATE:    11/18/23     EXERCISE LOG  Exercise Repetitions and Resistance Comments  Nustep  L4 x 14 mins w tennis shoe   Rockerboard Forward/backward and lateral x 3 mins each   Lunges  14" box x 3 mins   Standing Marches Airex x 2.5 mins   4way Ankle Exercises    Manual therapy  See below        Blank cell = exercise not performed today   Manual Therapy Soft Tissue Mobilization: left gastrocnemius, STW/M to left gastroc and medial left ankle to decrease pain and tone Passive ROM: left ankle, PROM into dorsiflexion and plantarflexion with gentle hold at end range to tolerancer Modalities: no redness or adverse reaction to today's modalities  Date:  Unattended Estim: Ankle, IFC 80-150 Hz, 15 mins, Pain and Edema Vaso: Ankle, 34 degrees; low pressure, 15 mins, Pain and Edema                                   10/28/23 EXERCISE LOG  Exercise Repetitions and Resistance Comments  Nustep  L1 x 10 minutes @ seat 7   Toe curls  30 reps    Manual therapy  See below    L ankle AROM  20 reps each  Open chain; all planes       Blank cell = exercise not performed today  Manual Therapy Soft Tissue Mobilization: Left gastrocnemius, soleus, anterior tibialis, and fibularis longus, for improved soft tissue extensibility Passive ROM: Left ankle, all planes to tolerance  Modalities: no adverse reaction to today's modalities  Date:  Vaso: Ankle, 34 degrees; low pressure, 15 mins, Pain and Edema  PATIENT EDUCATION:  Education details: healing, surgical condition, desensitization  Person educated: Patient Education method: Explanation Education comprehension: verbalized understanding  HOME EXERCISE  PROGRAM:   ASSESSMENT:  CLINICAL IMPRESSION: Pt arrives for today's treatment session reporting 6/10 left ankle pain.  Pt reports that her doctor has put her out of work for another four weeks and would like for her to push her ankle harder.  Pt able to improve her LEFs score to 36/80 today, meeting her STG.  Pt also able to demonstrate 2 degrees of left ankle dorsiflexion, meeting her STG and making good progress towards her LTG.  Pt introduced to standing lunges with good results Pt instructed to add lunges to HEP.  STW performed to left ankle scar tissue with good results.  Normal responses to estim and vaso noted upon removal.  Pt reported decreased pain at completion of today's treatment session.   OBJECTIVE IMPAIRMENTS: Abnormal gait, decreased activity tolerance, decreased mobility, difficulty walking, decreased ROM, decreased strength, hypomobility, increased edema, impaired flexibility, impaired sensation, impaired tone, and pain.   ACTIVITY LIMITATIONS: carrying, lifting, standing, squatting, sleeping, stairs, transfers, dressing, and locomotion level  PARTICIPATION LIMITATIONS: meal prep, cleaning, laundry, driving, shopping, community activity, and occupation  PERSONAL FACTORS: Past/current experiences, Time since onset of injury/illness/exacerbation, Transportation, and 3+ comorbidities: Epilepsy, fibromyalgia, history of seizures, chronic low back pain, and anxiety are also affecting patient's functional outcome.   REHAB POTENTIAL: Good  CLINICAL DECISION MAKING: Evolving/moderate complexity  EVALUATION COMPLEXITY: Moderate   GOALS: Goals reviewed with patient? Yes  SHORT TERM GOALS: Target date: 11/23/23 Patient will be independent with her initial HEP. Baseline: Goal status: MET  2.  Patient will be able to complete her daily activities without her familiar pain exceeding 8/10.  Baseline: "depends on the day" Goal status: PARTIALLY MET  3.  Patient will improve her  active left ankle dorsiflexion to at least neutral for improved ankle mobility. Baseline: 6/5: 2 degrees Goal status: MET  4.  Patient will improve her LEFS score to at least 30/80 for improved perceived function with her daily activities. Baseline: 36/80 Goal status: MET  LONG TERM GOALS: Target date: 12/21/23  Patient will be independent with her advanced HEP. Baseline:  Goal status: IN PROGRESS  2.  Patient will improve her active left ankle dorsiflexion to at least 5 degrees for improved gait mechanics. Baseline:  Goal status: IN PROGRESS  3.  Patient will be able to demonstrate minimal to no significant gait deviations for improved functional mobility. Baseline:  Goal status: IN PROGRESS  4.  Patient will improve her LEFS score to at least 45/80 for improved perceived function with her daily activities. Baseline:  Goal status: IN PROGRESS  5.  Patient will be able to navigate at least 4 steps with a reciprocal pattern for improved household mobility. Baseline:  Goal status: IN PROGRESS  6.  Patient will be able to return to her regular gym activities without being limited by her familiar left ankle symptoms. Baseline:  Goal status: IN PROGRESS  PLAN:  PT FREQUENCY: 2x/week  PT DURATION: 8 weeks  PLANNED INTERVENTIONS: 97164- PT Re-evaluation, 97750- Physical Performance Testing, 97110-Therapeutic exercises, 97530- Therapeutic activity, 97112- Neuromuscular re-education, 97535- Self Care, 16109- Manual therapy, [id number]- Gait training, [phone]- Electrical stimulation (unattended), 97016- Vasopneumatic device, Patient/Family education, Balance training, Stair training, Joint mobilization, Cryotherapy, and Moist heat  PLAN FOR NEXT SESSION: Nustep, PROM, manual therapy, and modalities as needed  Deryl Flora, PTA 11/18/2023, 5:49 PM

## 2023-11-19 NOTE — Telephone Encounter (Signed)
 Emailed 11/17/23 notes to pt at [email or web address] and advised via MyChart the were emailed

## 2023-11-23 ENCOUNTER — Ambulatory Visit

## 2023-11-23 DIAGNOSIS — M25572 Pain in left ankle and joints of left foot: Secondary | ICD-10-CM | POA: Diagnosis not present

## 2023-11-23 DIAGNOSIS — M25672 Stiffness of left ankle, not elsewhere classified: Secondary | ICD-10-CM

## 2023-11-23 NOTE — Therapy (Signed)
 OUTPATIENT PHYSICAL THERAPY LOWER EXTREMITY TREATMENT   Patient Name: [name] MRN: [id number] DOB:November 25, 1980, 43 y.o., female Today's Date: 11/23/2023  END OF SESSION:  PT End of Session - 11/23/23 1617     Visit Number 8    Number of Visits 16    Date for PT Re-Evaluation 01/07/24    PT Start Time 1600    PT Stop Time 1702    PT Time Calculation (min) 62 min    Activity Tolerance Patient tolerated treatment well    Behavior During Therapy WFL for tasks assessed/performed               Past Medical History:  Diagnosis Date   Abnormal Pap smear of cervix    Acne fulminans    Allergic rhinitis    Brain cyst    Breast nodule    Cervical polyp    Epilepsy (HCC)    Fibromyalgia    GERD (gastroesophageal reflux disease)    IBS (irritable bowel syndrome)    Kidney stones    Melanoma of hip (HCC)    Menorrhagia    Migraine headache    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HCC)    Previous stillbirth or demise, antepartum    Pulmonary nodule    previous evaluation - benign   Seizures (HCC)    recently diagnosed with epilepsy   Sepsis(995.91)    Shortness of breath    due to wt   Small bowel obstruction (HCC)    Staphylococcal infection    Past Surgical History:  Procedure Laterality Date   BREAST EXCISIONAL BIOPSY Left    caesarean section     HYSTEROSCOPY WITH D & C  04/27/2011   Procedure: DILATATION AND CURETTAGE (D&C) /HYSTEROSCOPY;  Surgeon: Johnn Najjar, MD;  Location: [facility];  Service: Gynecology;  Laterality: N/A;   MELANOMA EXCISION     svd     x 1 - stillborn at 79 weeks   Patient Active Problem List   Diagnosis Date Noted   Change in bowel habit 11/17/2023   Diverticular disease of colon 11/17/2023   Colon cancer screening 11/17/2023   Fecal urgency 11/17/2023   Flatulence, eructation and gas pain 11/17/2023   Acid reflux 11/17/2023   Hemorrhage of rectum and anus 11/17/2023   Rectal bleeding 11/17/2023   Left lower quadrant pain 11/17/2023    Nausea and vomiting 11/17/2023   Seizures (HCC) 11/17/2023   Vertigo 11/01/2023   Left foot pain 06/25/2023   Lumbar facet joint syndrome 06/25/2023   Migraine without aura and without status migrainosus, not intractable 04/07/2023   Seizure disorder, complex partial (HCC) 04/07/2023   Chronic midline low back pain without sciatica 10/16/2022   Myofascial pain syndrome, cervical 10/16/2022   History of colonic polyps 01/16/2022   Encounter for IUD insertion 09/25/2021   High grade squamous intraepithelial lesion (HGSIL), grade 3 CIN, on biopsy of cervix 09/25/2021   IUD (intrauterine device) in place 09/25/2021   Mixed hyperlipidemia 08/21/2021   Prediabetes 08/21/2021   Metabolic syndrome 08/15/2021   HPV in female 05/07/2021   Nerve pain 07/06/2020   Epilepsy (HCC) 03/07/2020   Solitary pulmonary nodule 07/10/2018   Dysuria 07/08/2018   Morbid obesity due to excess calories (HCC) 06/25/2018   DOE (dyspnea on exertion) 06/20/2018   Chronic cough 06/20/2018   GAD (generalized anxiety disorder) 10/19/2017   Insomnia due to other mental disorder 10/19/2017   Vitamin D deficiency 07/19/2015   Celiac disease 04/03/2014   PALPITATIONS 06/12/2010  CHEST PAIN, PRECORDIAL 06/12/2010   Migraine headache 06/11/2010   IBS 06/11/2010   ALLERGIC RHINITIS 09/11/2009    PCP: Artie Laster, FNP  REFERRING PROVIDER: Velma Ghazi, DPM   REFERRING DIAG: Plantar fasciitis of left foot, Achilles tendinitis of left lower extremity, S/P foot surgery   THERAPY DIAG:  Pain in left ankle and joints of left foot  Stiffness of left ankle, not elsewhere classified  Rationale for Evaluation and Treatment: Rehabilitation  ONSET DATE: 09/20/23  SUBJECTIVE:   SUBJECTIVE STATEMENT: Pt reports 5/10 left ankle pain today.    PERTINENT HISTORY: Epilepsy, fibromyalgia, history of seizures, chronic low back pain, and anxiety  PAIN:  Are you having pain? Yes: NPRS scale: 5/10 Pain location:  posterior ankle and plantar surface of the foot Pain description: constant dull  Aggravating factors: walking (30-45 minutes) anything touching her heel Relieving factors: heat  PRECAUTIONS: Other: Ankle  RED FLAGS: None   WEIGHT BEARING RESTRICTIONS: Yes WBAT  FALLS:  Has patient fallen in last 6 months? 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  PATIENT SURVEYS:  LEFS 18/80  COGNITION: Overall cognitive status: Within functional limits for tasks assessed     SENSATION: Patient reports no numbness or tingling  EDEMA:  Figure 8: Left: 58.5 cm Right: 57.5 cm   PALPATION: TTP: left gastroc/soleus, medial and lateral malleolus, plantar fascia  JOINT MOBILITY:   Left foot and ankle: hypomobile and painful   LOWER EXTREMITY ROM:  Active ROM Right eval Left eval  Hip flexion    Hip extension    Hip abduction    Hip adduction    Hip internal rotation    Hip external rotation    Knee flexion    Knee extension    Ankle dorsiflexion 5 -5  Ankle plantarflexion 65 25  Ankle inversion 43 11  Ankle eversion 28 5   (Blank rows = not tested)  LOWER EXTREMITY MMT: not tested due to surgical condition  MMT Right eval Left eval  Hip flexion    Hip extension    Hip abduction    Hip adduction    Hip internal rotation    Hip external rotation    Knee flexion    Knee extension    Ankle dorsiflexion    Ankle plantarflexion    Ankle inversion    Ankle eversion     (Blank rows = not tested)  GAIT: Assistive device utilized: Walker - 4  wheeled Level of assistance: Modified independence Comments: WBAT with CAM boot  TREATMENT DATE:    11/23/23     EXERCISE LOG  Exercise Repetitions and Resistance Comments  Nustep  L4 x 5 mins w tennis shoe   Recumbent Bike Lvl 1 x 10 mins   Rockerboard Forward/backward and lateral x 3.5 mins each   Lunges  14" box x 3.5 mins   Standing Marches    4way Ankle Exercises    Manual therapy  See below        Blank cell = exercise not performed today   Manual Therapy Soft Tissue Mobilization: left gastrocnemius, STW/M to left gastroc and medial left ankle to decrease pain and tone Passive ROM: left ankle, PROM into dorsiflexion and plantarflexion with gentle hold at end range to tolerancer Modalities: no redness or adverse reaction to today's modalities  Date:  Unattended Estim: Ankle, IFC 80-150 Hz, 15 mins, Pain and Edema Vaso: Ankle, 34 degrees; low pressure, 15 mins, Pain and Edema                                   10/28/23 EXERCISE LOG  Exercise Repetitions and Resistance Comments  Nustep  L1 x 10 minutes @ seat 7   Toe curls  30 reps    Manual therapy  See below    L ankle AROM  20 reps each  Open chain; all planes       Blank cell = exercise not performed today  Manual Therapy Soft Tissue Mobilization: Left gastrocnemius, soleus, anterior tibialis, and fibularis longus, for improved soft tissue extensibility Passive ROM: Left ankle, all planes to tolerance  Modalities: no adverse reaction to today's modalities  Date:  Vaso: Ankle, 34 degrees; low pressure, 15 mins, Pain and Edema  PATIENT EDUCATION:  Education details: healing, surgical condition, desensitization  Person educated: Patient Education method: Explanation Education comprehension: verbalized understanding  HOME EXERCISE PROGRAM:   ASSESSMENT:  CLINICAL IMPRESSION: Pt  arrives for today's treatment session reporting 5/10 left ankle pain.  Pt able to transition to recumbent bike today with good results.  Pt states that she has been perform exercises at home and has been attending the gym.  STW/M performed to left heel and achilles region to decrease pain and tone.  Normal responses to estim and vaso noted upon removal.  Pt reported decreased pain at completion of today's treatment session.   OBJECTIVE IMPAIRMENTS: Abnormal gait, decreased activity tolerance, decreased mobility, difficulty walking, decreased ROM, decreased strength, hypomobility, increased edema, impaired flexibility, impaired sensation, impaired tone, and pain.   ACTIVITY LIMITATIONS: carrying, lifting, standing, squatting, sleeping, stairs, transfers, dressing, and locomotion level  PARTICIPATION LIMITATIONS: meal prep, cleaning, laundry, driving, shopping, community activity, and occupation  PERSONAL FACTORS: Past/current experiences, Time since onset of injury/illness/exacerbation, Transportation, and 3+ comorbidities: Epilepsy, fibromyalgia, history of seizures, chronic low back pain, and anxiety are also affecting patient's functional outcome.   REHAB POTENTIAL: Good  CLINICAL DECISION MAKING: Evolving/moderate complexity  EVALUATION COMPLEXITY: Moderate   GOALS: Goals reviewed with patient? Yes  SHORT TERM GOALS: Target date: 11/23/23 Patient will be independent with her initial HEP. Baseline: Goal status: MET  2.  Patient will be able to complete her daily activities without her familiar pain exceeding 8/10. Baseline: "depends on the day" Goal status: PARTIALLY MET  3.  Patient will improve her active left ankle dorsiflexion to at least neutral for improved ankle mobility. Baseline: 6/5: 2 degrees Goal status: MET  4.  Patient will  improve her LEFS score to at least 30/80 for improved perceived function with her daily activities. Baseline: 36/80 Goal status: MET  LONG TERM  GOALS: Target date: 12/21/23  Patient will be independent with her advanced HEP. Baseline:  Goal status: IN PROGRESS  2.  Patient will improve her active left ankle dorsiflexion to at least 5 degrees for improved gait mechanics. Baseline:  Goal status: IN PROGRESS  3.  Patient will be able to demonstrate minimal to no significant gait deviations for improved functional mobility. Baseline:  Goal status: IN PROGRESS  4.  Patient will improve her LEFS score to at least 45/80 for improved perceived function with her daily activities. Baseline:  Goal status: IN PROGRESS  5.  Patient will be able to navigate at least 4 steps with a reciprocal pattern for improved household mobility. Baseline:  Goal status: IN PROGRESS  6.  Patient will be able to return to her regular gym activities without being limited by her familiar left ankle symptoms. Baseline:  Goal status: IN PROGRESS  PLAN:  PT FREQUENCY: 2x/week  PT DURATION: 8 weeks  PLANNED INTERVENTIONS: 97164- PT Re-evaluation, 97750- Physical Performance Testing, 97110-Therapeutic exercises, 97530- Therapeutic activity, 97112- Neuromuscular re-education, 97535- Self Care, 91478- Manual therapy, [phone]- Gait training, [phone]- Electrical stimulation (unattended), 97016- Vasopneumatic device, Patient/Family education, Balance training, Stair training, Joint mobilization, Cryotherapy, and Moist heat  PLAN FOR NEXT SESSION: Nustep, PROM, manual therapy, and modalities as needed  Deryl Flora, PTA 11/23/2023, 5:37 PM

## 2023-11-25 ENCOUNTER — Ambulatory Visit

## 2023-11-25 DIAGNOSIS — M25572 Pain in left ankle and joints of left foot: Secondary | ICD-10-CM | POA: Diagnosis not present

## 2023-11-25 DIAGNOSIS — M25672 Stiffness of left ankle, not elsewhere classified: Secondary | ICD-10-CM

## 2023-11-25 NOTE — Therapy (Signed)
 OUTPATIENT PHYSICAL THERAPY LOWER EXTREMITY TREATMENT   Patient Name: [name] MRN: [id number] DOB:[birth year], 43 y.o., female Today's Date: 11/25/2023  END OF SESSION:  PT End of Session - 11/25/23 1618     Visit Number 9    Number of Visits 16    Date for PT Re-Evaluation 01/07/24    PT Start Time 1600    PT Stop Time 1700    PT Time Calculation (min) 60 min    Activity Tolerance Patient tolerated treatment well    Behavior During Therapy WFL for tasks assessed/performed            Past Medical History:  Diagnosis Date   Abnormal Pap smear of cervix    Acne fulminans    Allergic rhinitis    Brain cyst    Breast nodule    Cervical polyp    Epilepsy (HCC)    Fibromyalgia    GERD (gastroesophageal reflux disease)    IBS (irritable bowel syndrome)    Kidney stones    Melanoma of hip (HCC)    Menorrhagia    Migraine headache    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HCC)    Previous stillbirth or demise, antepartum    Pulmonary nodule    previous evaluation - benign   Seizures (HCC)    recently diagnosed with epilepsy   Sepsis(995.91)    Shortness of breath    due to wt   Small bowel obstruction (HCC)    Staphylococcal infection    Past Surgical History:  Procedure Laterality Date   BREAST EXCISIONAL BIOPSY Left    caesarean section     HYSTEROSCOPY WITH D & C  04/27/2011   Procedure: DILATATION AND CURETTAGE (D&C) /HYSTEROSCOPY;  Surgeon: Johnn Najjar, MD;  Location: [facility];  Service: Gynecology;  Laterality: N/A;   MELANOMA EXCISION     svd     x 1 - stillborn at 57 weeks   Patient Active Problem List   Diagnosis Date Noted   Change in bowel habit 11/17/2023   Diverticular disease of colon 11/17/2023   Colon cancer screening 11/17/2023   Fecal urgency 11/17/2023   Flatulence, eructation and gas pain 11/17/2023   Acid reflux 11/17/2023   Hemorrhage of rectum and anus 11/17/2023   Rectal bleeding 11/17/2023   Left lower quadrant pain 11/17/2023   Nausea  and vomiting 11/17/2023   Seizures (HCC) 11/17/2023   Vertigo 11/01/2023   Left foot pain 06/25/2023   Lumbar facet joint syndrome 06/25/2023   Migraine without aura and without status migrainosus, not intractable 04/07/2023   Seizure disorder, complex partial (HCC) 04/07/2023   Chronic midline low back pain without sciatica 10/16/2022   Myofascial pain syndrome, cervical 10/16/2022   History of colonic polyps 01/16/2022   Encounter for IUD insertion 09/25/2021   High grade squamous intraepithelial lesion (HGSIL), grade 3 CIN, on biopsy of cervix 09/25/2021   IUD (intrauterine device) in place 09/25/2021   Mixed hyperlipidemia 08/21/2021   Prediabetes 08/21/2021   Metabolic syndrome 08/15/2021   HPV in female 05/07/2021   Nerve pain 07/06/2020   Epilepsy (HCC) 03/07/2020   Solitary pulmonary nodule 07/10/2018   Dysuria 07/08/2018   Morbid obesity due to excess calories (HCC) 06/25/2018   DOE (dyspnea on exertion) 06/20/2018   Chronic cough 06/20/2018   GAD (generalized anxiety disorder) 10/19/2017   Insomnia due to other mental disorder 10/19/2017   Vitamin D deficiency 07/19/2015   Celiac disease 04/03/2014   PALPITATIONS 06/12/2010   CHEST PAIN,  PRECORDIAL 06/12/2010   Migraine headache 06/11/2010   IBS 06/11/2010   ALLERGIC RHINITIS 09/11/2009    PCP: Artie Laster, FNP  REFERRING PROVIDER: Velma Ghazi, DPM   REFERRING DIAG: Plantar fasciitis of left foot, Achilles tendinitis of left lower extremity, S/P foot surgery   THERAPY DIAG:  Pain in left ankle and joints of left foot  Stiffness of left ankle, not elsewhere classified  Rationale for Evaluation and Treatment: Rehabilitation  ONSET DATE: 09/20/23  SUBJECTIVE:   SUBJECTIVE STATEMENT: Pt reports 5/10 left ankle pain today.    PERTINENT HISTORY: Epilepsy, fibromyalgia, history of seizures, chronic low back pain, and anxiety  PAIN:  Are you having pain? Yes: NPRS scale: 5/10 Pain location:  posterior ankle and plantar surface of the foot Pain description: constant dull  Aggravating factors: walking (30-45 minutes) anything touching her heel Relieving factors: heat  PRECAUTIONS: Other: Ankle  RED FLAGS: None   WEIGHT BEARING RESTRICTIONS: Yes WBAT  FALLS:  Has patient fallen in last 6 months? 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  PATIENT SURVEYS:  LEFS 18/80  COGNITION: Overall cognitive status: Within functional limits for tasks assessed     SENSATION: Patient reports no numbness or tingling  EDEMA:  Figure 8: Left: 58.5 cm Right: 57.5 cm   PALPATION: TTP: left gastroc/soleus, medial and lateral malleolus, plantar fascia  JOINT MOBILITY:   Left foot and ankle: hypomobile and painful   LOWER EXTREMITY ROM:  Active ROM Right eval Left eval  Hip flexion    Hip extension    Hip abduction    Hip adduction    Hip internal rotation    Hip external rotation    Knee flexion    Knee extension    Ankle dorsiflexion 5 -5  Ankle plantarflexion 65 25  Ankle inversion 43 11  Ankle eversion 28 5   (Blank rows = not tested)  LOWER EXTREMITY MMT: not tested due to surgical condition  MMT Right eval Left eval  Hip flexion    Hip extension    Hip abduction    Hip adduction    Hip internal rotation    Hip external rotation    Knee flexion    Knee extension    Ankle dorsiflexion    Ankle plantarflexion    Ankle inversion    Ankle eversion     (Blank rows = not tested)  GAIT: Assistive device utilized: Walker - 4  wheeled Level of assistance: Modified independence Comments: WBAT with CAM boot  TREATMENT DATE:    6/102/25     EXERCISE LOG  Exercise Repetitions and Resistance Comments  Nustep  L4 x 10 mins w tennis shoe   Recumbent Bike Lvl 1 x 10 mins   Rockerboard Forward/backward and lateral x 3.5 mins each   Lunges  14 box x 3.5 mins   Standing Marches    4way Ankle Exercises    Manual therapy  See below        Blank cell = exercise not performed today   Manual Therapy Soft Tissue Mobilization: left gastrocnemius, STW/M to left gastroc and medial left ankle to decrease pain and tone Passive ROM: left ankle, PROM into dorsiflexion and plantarflexion with gentle hold at end range to tolerancer Modalities: no redness or adverse reaction to today's modalities  Date:  Unattended Estim: Ankle, IFC 80-150 Hz, 15 mins, Pain and Edema Vaso: Ankle, 34 degrees; low pressure, 15 mins, Pain and Edema                                   10/28/23 EXERCISE LOG  Exercise Repetitions and Resistance Comments  Nustep  L1 x 10 minutes @ seat 7   Toe curls  30 reps    Manual therapy  See below    L ankle AROM  20 reps each  Open chain; all planes       Blank cell = exercise not performed today  Manual Therapy Soft Tissue Mobilization: Left gastrocnemius, soleus, anterior tibialis, and fibularis longus, for improved soft tissue extensibility Passive ROM: Left ankle, all planes to tolerance  Modalities: no adverse reaction to today's modalities  Date:  Vaso: Ankle, 34 degrees; low pressure, 15 mins, Pain and Edema  PATIENT EDUCATION:  Education details: healing, surgical condition, desensitization  Person educated: Patient Education method: Explanation Education comprehension: verbalized understanding  HOME EXERCISE PROGRAM:   ASSESSMENT:  CLINICAL IMPRESSION: Pt  arrives for today's treatment session reporting 5/10 left ankle pain.  Pt concerned that her sock my be rubbing on her incision and making it row.  Discussed benefits of compression socks/stockings.  STW/M performed to plantar surface, along achilles, and up into gastroc.  Normal responses to estim and vaso noted upon removal.  Pt reported decreased pain at completion of today's treatment session.   OBJECTIVE IMPAIRMENTS: Abnormal gait, decreased activity tolerance, decreased mobility, difficulty walking, decreased ROM, decreased strength, hypomobility, increased edema, impaired flexibility, impaired sensation, impaired tone, and pain.   ACTIVITY LIMITATIONS: carrying, lifting, standing, squatting, sleeping, stairs, transfers, dressing, and locomotion level  PARTICIPATION LIMITATIONS: meal prep, cleaning, laundry, driving, shopping, community activity, and occupation  PERSONAL FACTORS: Past/current experiences, Time since onset of injury/illness/exacerbation, Transportation, and 3+ comorbidities: Epilepsy, fibromyalgia, history of seizures, chronic low back pain, and anxiety are also affecting patient's functional outcome.   REHAB POTENTIAL: Good  CLINICAL DECISION MAKING: Evolving/moderate complexity  EVALUATION COMPLEXITY: Moderate   GOALS: Goals reviewed with patient? Yes  SHORT TERM GOALS: Target date: 11/23/23 Patient will be independent with her initial HEP. Baseline: Goal status: MET  2.  Patient will be able to complete her daily activities without her familiar pain exceeding 8/10. Baseline: depends on the day Goal status: PARTIALLY MET  3.  Patient will improve her active left ankle dorsiflexion to at least neutral for improved ankle mobility. Baseline: 6/5: 2 degrees Goal status: MET  4.  Patient will improve her LEFS score to at least 30/80 for  improved perceived function with her daily activities. Baseline: 36/80 Goal status: MET  LONG TERM GOALS: Target date:  12/21/23  Patient will be independent with her advanced HEP. Baseline:  Goal status: IN PROGRESS  2.  Patient will improve her active left ankle dorsiflexion to at least 5 degrees for improved gait mechanics. Baseline:  Goal status: IN PROGRESS  3.  Patient will be able to demonstrate minimal to no significant gait deviations for improved functional mobility. Baseline:  Goal status: IN PROGRESS  4.  Patient will improve her LEFS score to at least 45/80 for improved perceived function with her daily activities. Baseline:  Goal status: IN PROGRESS  5.  Patient will be able to navigate at least 4 steps with a reciprocal pattern for improved household mobility. Baseline:  Goal status: IN PROGRESS  6.  Patient will be able to return to her regular gym activities without being limited by her familiar left ankle symptoms. Baseline:  Goal status: IN PROGRESS  PLAN:  PT FREQUENCY: 2x/week  PT DURATION: 8 weeks  PLANNED INTERVENTIONS: 97164- PT Re-evaluation, 97750- Physical Performance Testing, 97110-Therapeutic exercises, 97530- Therapeutic activity, 97112- Neuromuscular re-education, 97535- Self Care, [id number]- Manual therapy, [phone]- Gait training, [phone]- Electrical stimulation (unattended), 97016- Vasopneumatic device, Patient/Family education, Balance training, Stair training, Joint mobilization, Cryotherapy, and Moist heat  PLAN FOR NEXT SESSION: Nustep, PROM, manual therapy, and modalities as needed  Deryl Flora, PTA 11/25/2023, 5:09 PM

## 2023-11-29 NOTE — Telephone Encounter (Signed)
 Pt sent forms from [employer] again via email. She came in office and waited for completion.She was advised of forms are done in the order in which they are recd and (10-14 business days), but I advised her would do while she waited. Faxed [employer] revised RTW 12/22/23 date with notes/form and made copy for pt to have as to what I faxed.

## 2023-11-30 ENCOUNTER — Ambulatory Visit

## 2023-11-30 DIAGNOSIS — M25672 Stiffness of left ankle, not elsewhere classified: Secondary | ICD-10-CM

## 2023-11-30 DIAGNOSIS — M25572 Pain in left ankle and joints of left foot: Secondary | ICD-10-CM

## 2023-11-30 NOTE — Therapy (Addendum)
 OUTPATIENT PHYSICAL THERAPY LOWER EXTREMITY TREATMENT   Patient Name: [name] MRN: [id number] DOB:[birth year], 43 y.o., female Today's Date: 11/30/2023  END OF SESSION:  PT End of Session - 11/30/23 1609     Visit Number 10    Number of Visits 16    Date for PT Re-Evaluation 01/07/24    PT Start Time 1600    PT Stop Time 1714    PT Time Calculation (min) 74 min    Activity Tolerance Patient tolerated treatment well    Behavior During Therapy WFL for tasks assessed/performed            Past Medical History:  Diagnosis Date   Abnormal Pap smear of cervix    Acne fulminans    Allergic rhinitis    Brain cyst    Breast nodule    Cervical polyp    Epilepsy (HCC)    Fibromyalgia    GERD (gastroesophageal reflux disease)    IBS (irritable bowel syndrome)    Kidney stones    Melanoma of hip (HCC)    Menorrhagia    Migraine headache    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HCC)    Previous stillbirth or demise, antepartum    Pulmonary nodule    previous evaluation - benign   Seizures (HCC)    recently diagnosed with epilepsy   Sepsis(995.91)    Shortness of breath    due to wt   Small bowel obstruction (HCC)    Staphylococcal infection    Past Surgical History:  Procedure Laterality Date   BREAST EXCISIONAL BIOPSY Left    caesarean section     HYSTEROSCOPY WITH D & C  04/27/2011   Procedure: DILATATION AND CURETTAGE (D&C) /HYSTEROSCOPY;  Surgeon: Johnn Najjar, MD;  Location: [facility];  Service: Gynecology;  Laterality: N/A;   MELANOMA EXCISION     svd     x 1 - stillborn at 58 weeks   Patient Active Problem List   Diagnosis Date Noted   Change in bowel habit 11/17/2023   Diverticular disease of colon 11/17/2023   Colon cancer screening 11/17/2023   Fecal urgency 11/17/2023   Flatulence, eructation and gas pain 11/17/2023   Acid reflux 11/17/2023   Hemorrhage of rectum and anus 11/17/2023   Rectal bleeding 11/17/2023   Left lower quadrant pain 11/17/2023   Nausea  and vomiting 11/17/2023   Seizures (HCC) 11/17/2023   Vertigo 11/01/2023   Left foot pain 06/25/2023   Lumbar facet joint syndrome 06/25/2023   Migraine without aura and without status migrainosus, not intractable 04/07/2023   Seizure disorder, complex partial (HCC) 04/07/2023   Chronic midline low back pain without sciatica 10/16/2022   Myofascial pain syndrome, cervical 10/16/2022   History of colonic polyps 01/16/2022   Encounter for IUD insertion 09/25/2021   High grade squamous intraepithelial lesion (HGSIL), grade 3 CIN, on biopsy of cervix 09/25/2021   IUD (intrauterine device) in place 09/25/2021   Mixed hyperlipidemia 08/21/2021   Prediabetes 08/21/2021   Metabolic syndrome 08/15/2021   HPV in female 05/07/2021   Nerve pain 07/06/2020   Epilepsy (HCC) 03/07/2020   Solitary pulmonary nodule 07/10/2018   Dysuria 07/08/2018   Morbid obesity due to excess calories (HCC) 06/25/2018   DOE (dyspnea on exertion) 06/20/2018   Chronic cough 06/20/2018   GAD (generalized anxiety disorder) 10/19/2017   Insomnia due to other mental disorder 10/19/2017   Vitamin D deficiency 07/19/2015   Celiac disease 04/03/2014   PALPITATIONS 06/12/2010   CHEST PAIN,  PRECORDIAL 06/12/2010   Migraine headache 06/11/2010   IBS 06/11/2010   ALLERGIC RHINITIS 09/11/2009    PCP: Artie Laster, FNP  REFERRING PROVIDER: Velma Ghazi, DPM   REFERRING DIAG: Plantar fasciitis of left foot, Achilles tendinitis of left lower extremity, S/P foot surgery   THERAPY DIAG:  Pain in left ankle and joints of left foot  Stiffness of left ankle, not elsewhere classified  Rationale for Evaluation and Treatment: Rehabilitation  ONSET DATE: 09/20/23  SUBJECTIVE:   SUBJECTIVE STATEMENT: Pt reports 5/10 left ankle pain today.    PERTINENT HISTORY: Epilepsy, fibromyalgia, history of seizures, chronic low back pain, and anxiety  PAIN:  Are you having pain? Yes: NPRS scale: 5/10 Pain location:  posterior ankle and plantar surface of the foot Pain description: constant dull  Aggravating factors: walking (30-45 minutes) anything touching her heel Relieving factors: heat  PRECAUTIONS: Other: Ankle  RED FLAGS: None   WEIGHT BEARING RESTRICTIONS: Yes WBAT  FALLS:  Has patient fallen in last 6 months? 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  PATIENT SURVEYS:  LEFS 18/80  COGNITION: Overall cognitive status: Within functional limits for tasks assessed     SENSATION: Patient reports no numbness or tingling  EDEMA:  Figure 8: Left: 58.5 cm Right: 57.5 cm   PALPATION: TTP: left gastroc/soleus, medial and lateral malleolus, plantar fascia  JOINT MOBILITY:   Left foot and ankle: hypomobile and painful   LOWER EXTREMITY ROM:  Active ROM Right eval Left eval  Hip flexion    Hip extension    Hip abduction    Hip adduction    Hip internal rotation    Hip external rotation    Knee flexion    Knee extension    Ankle dorsiflexion 5 -5  Ankle plantarflexion 65 25  Ankle inversion 43 11  Ankle eversion 28 5   (Blank rows = not tested)  LOWER EXTREMITY MMT: not tested due to surgical condition  MMT Right eval Left eval  Hip flexion    Hip extension    Hip abduction    Hip adduction    Hip internal rotation    Hip external rotation    Knee flexion    Knee extension    Ankle dorsiflexion    Ankle plantarflexion    Ankle inversion    Ankle eversion     (Blank rows = not tested)  GAIT: Assistive device utilized: Walker - 4  wheeled Level of assistance: Modified independence Comments: WBAT with CAM boot  TREATMENT DATE:    11/30/23     EXERCISE LOG  Exercise Repetitions and Resistance Comments  Nustep     Recumbent Bike Lvl 1 x 15 mins   Rockerboard Forward/backward and lateral x 4 mins each   Lunges  14 box x 4 mins   Leg Press 2 plates; seat 7 x 3 mins   Stairs Four stairs x 3 reps   Standing Marches    Goal Assessment See below   4way Ankle Exercises    Manual therapy  See below        Blank cell = exercise not performed today   Manual Therapy Soft Tissue Mobilization: left gastrocnemius, STW/M to left gastroc and medial left ankle to decrease pain and tone Passive ROM: left ankle, PROM into dorsiflexion and plantarflexion with gentle hold at end range to tolerancer Modalities: no redness or adverse reaction to today's modalities  Date:  Unattended Estim: Ankle, IFC 80-150 Hz, 15 mins, Pain and Edema Vaso: Ankle, 34 degrees; low pressure, 15 mins, Pain and Edema                                   10/28/23 EXERCISE LOG  Exercise Repetitions and Resistance Comments  Nustep  L1 x 10 minutes @ seat 7   Toe curls  30 reps    Manual therapy  See below    L ankle AROM  20 reps each  Open chain; all planes       Blank cell = exercise not performed today  Manual Therapy Soft Tissue Mobilization: Left gastrocnemius, soleus, anterior tibialis, and fibularis longus, for improved soft tissue extensibility Passive ROM: Left ankle, all planes to tolerance  Modalities: no adverse reaction to today's modalities  Date:  Vaso: Ankle, 34 degrees; low pressure, 15 mins, Pain and Edema  PATIENT EDUCATION:  Education details: healing, surgical condition, desensitization  Person educated: Patient Education method: Explanation Education comprehension: verbalized  understanding  HOME EXERCISE PROGRAM:   ASSESSMENT:  CLINICAL IMPRESSION: Pt arrives for today's treatment session reporting 5/10 left ankle pain.  Pt able to transition to only recumbent bike for warm up today.  Pt able to increase LEFs score to 41/80, making progress towards her LTG.  Pt able to demonstrate 7 degrees of dorsiflexion, 36 degrees of plantarflexion, 34 degrees of inversion and 16 degrees of eversion, meeting her LTG for dorsiflexion and increasing her ROM in all directions.  Pt also able to demonstrate reciprocal gait when navigating four stairs.  Pt does experience increased pain while descending.  Pt introduced to cybex leg press with good results.  STW/M performed to left heel and achilles region to decrease pain and tone.  Normal responses to estim and vaso noted upon removal.  Pt reported minimal decrease in pain at completion of today's treatment session.   11/30/23 PROGRESS REPORT:  Patient is making excellent progress with skilled physical therapy as evidenced by her improvements in her left ankle active range of motion and LEFS score.  She was able to meet most of her short and long-term goals for skilled physical therapy.  However, she continues to experience intermittent periods of increased left foot pain and she continues to be limited with functional activities such as squatting and other gym activities.  Recommend that she continue with her current plan of care to address her remaining impairments to safely return to her prior level of function.  Raeann Buhl  Barts, PT, DPT   OBJECTIVE IMPAIRMENTS: Abnormal gait, decreased activity tolerance, decreased mobility, difficulty walking, decreased ROM, decreased strength, hypomobility, increased edema, impaired flexibility, impaired sensation, impaired tone, and pain.   ACTIVITY LIMITATIONS: carrying, lifting, standing, squatting, sleeping, stairs, transfers, dressing, and locomotion level  PARTICIPATION LIMITATIONS: meal prep,  cleaning, laundry, driving, shopping, community activity, and occupation  PERSONAL FACTORS: Past/current experiences, Time since onset of injury/illness/exacerbation, Transportation, and 3+ comorbidities: Epilepsy, fibromyalgia, history of seizures, chronic low back pain, and anxiety are also affecting patient's functional outcome.   REHAB POTENTIAL: Good  CLINICAL DECISION MAKING: Evolving/moderate complexity  EVALUATION COMPLEXITY: Moderate   GOALS: Goals reviewed with patient? Yes  SHORT TERM GOALS: Target date: 11/23/23 Patient will be independent with her initial HEP. Baseline: Goal status: MET  2.  Patient will be able to complete her daily activities without her familiar pain exceeding 8/10. Baseline: depends on the day Goal status: PARTIALLY MET  3.  Patient will improve her active left ankle dorsiflexion to at least neutral for improved ankle mobility. Baseline: 6/5: 2 degrees Goal status: MET  4.  Patient will improve her LEFS score to at least 30/80 for improved perceived function with her daily activities. Baseline: 36/80 Goal status: MET  LONG TERM GOALS: Target date: 12/21/23  Patient will be independent with her advanced HEP. Baseline:  Goal status: MET  2.  Patient will improve her active left ankle dorsiflexion to at least 5 degrees for improved gait mechanics. Baseline: 6/17: 7 degrees Goal status: MET  3.  Patient will be able to demonstrate minimal to no significant gait deviations for improved functional mobility. Baseline:  Goal status: MET  4.  Patient will improve her LEFS score to at least 45/80 for improved perceived function with her daily activities. Baseline: 6/17: 41/80 Goal status: IN PROGRESS  5.  Patient will be able to navigate at least 4 steps with a reciprocal pattern for improved household mobility. Baseline:  Goal status: MET  6.  Patient will be able to return to her regular gym activities without being limited by her familiar  left ankle symptoms. Baseline:  Goal status: IN PROGRESS  PLAN:  PT FREQUENCY: 2x/week  PT DURATION: 8 weeks  PLANNED INTERVENTIONS: 97164- PT Re-evaluation, 97750- Physical Performance Testing, 97110-Therapeutic exercises, 97530- Therapeutic activity, 97112- Neuromuscular re-education, 97535- Self Care, 16109- Manual therapy, [phone]- Gait training, [phone]- Electrical stimulation (unattended), 97016- Vasopneumatic device, Patient/Family education, Balance training, Stair training, Joint mobilization, Cryotherapy, and Moist heat  PLAN FOR NEXT SESSION: Nustep, PROM, manual therapy, and modalities as needed  Deryl Flora, PTA 11/30/2023, 5:24 PM

## 2023-12-02 ENCOUNTER — Ambulatory Visit

## 2023-12-02 DIAGNOSIS — M25672 Stiffness of left ankle, not elsewhere classified: Secondary | ICD-10-CM

## 2023-12-02 DIAGNOSIS — M25572 Pain in left ankle and joints of left foot: Secondary | ICD-10-CM | POA: Diagnosis not present

## 2023-12-02 NOTE — Therapy (Signed)
 OUTPATIENT PHYSICAL THERAPY LOWER EXTREMITY TREATMENT   Patient Name: [name] MRN: [id number] DOB:1980-08-29, 43 y.o., female Today's Date: 12/02/2023  END OF SESSION:  PT End of Session - 12/02/23 1559     Visit Number 11    Number of Visits 16    Date for PT Re-Evaluation 01/07/24    PT Start Time 1600    PT Stop Time 1649    PT Time Calculation (min) 49 min    Activity Tolerance Patient tolerated treatment well    Behavior During Therapy WFL for tasks assessed/performed             Past Medical History:  Diagnosis Date   Abnormal Pap smear of cervix    Acne fulminans    Allergic rhinitis    Brain cyst    Breast nodule    Cervical polyp    Epilepsy (HCC)    Fibromyalgia    GERD (gastroesophageal reflux disease)    IBS (irritable bowel syndrome)    Kidney stones    Melanoma of hip (HCC)    Menorrhagia    Migraine headache    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HCC)    Previous stillbirth or demise, antepartum    Pulmonary nodule    previous evaluation - benign   Seizures (HCC)    recently diagnosed with epilepsy   Sepsis(995.91)    Shortness of breath    due to wt   Small bowel obstruction (HCC)    Staphylococcal infection    Past Surgical History:  Procedure Laterality Date   BREAST EXCISIONAL BIOPSY Left    caesarean section     HYSTEROSCOPY WITH D & C  04/27/2011   Procedure: DILATATION AND CURETTAGE (D&C) /HYSTEROSCOPY;  Surgeon: Johnn Najjar, MD;  Location: [facility];  Service: Gynecology;  Laterality: N/A;   MELANOMA EXCISION     svd     x 1 - stillborn at 8 weeks   Patient Active Problem List   Diagnosis Date Noted   Change in bowel habit 11/17/2023   Diverticular disease of colon 11/17/2023   Colon cancer screening 11/17/2023   Fecal urgency 11/17/2023   Flatulence, eructation and gas pain 11/17/2023   Acid reflux 11/17/2023   Hemorrhage of rectum and anus 11/17/2023   Rectal bleeding 11/17/2023   Left lower quadrant pain 11/17/2023   Nausea  and vomiting 11/17/2023   Seizures (HCC) 11/17/2023   Vertigo 11/01/2023   Left foot pain 06/25/2023   Lumbar facet joint syndrome 06/25/2023   Migraine without aura and without status migrainosus, not intractable 04/07/2023   Seizure disorder, complex partial (HCC) 04/07/2023   Chronic midline low back pain without sciatica 10/16/2022   Myofascial pain syndrome, cervical 10/16/2022   History of colonic polyps 01/16/2022   Encounter for IUD insertion 09/25/2021   High grade squamous intraepithelial lesion (HGSIL), grade 3 CIN, on biopsy of cervix 09/25/2021   IUD (intrauterine device) in place 09/25/2021   Mixed hyperlipidemia 08/21/2021   Prediabetes 08/21/2021   Metabolic syndrome 08/15/2021   HPV in female 05/07/2021   Nerve pain 07/06/2020   Epilepsy (HCC) 03/07/2020   Solitary pulmonary nodule 07/10/2018   Dysuria 07/08/2018   Morbid obesity due to excess calories (HCC) 06/25/2018   DOE (dyspnea on exertion) 06/20/2018   Chronic cough 06/20/2018   GAD (generalized anxiety disorder) 10/19/2017   Insomnia due to other mental disorder 10/19/2017   Vitamin D deficiency 07/19/2015   Celiac disease 04/03/2014   PALPITATIONS 06/12/2010   CHEST  PAIN, PRECORDIAL 06/12/2010   Migraine headache 06/11/2010   IBS 06/11/2010   ALLERGIC RHINITIS 09/11/2009    PCP: Artie Laster, FNP  REFERRING PROVIDER: Velma Ghazi, DPM   REFERRING DIAG: Plantar fasciitis of left foot, Achilles tendinitis of left lower extremity, S/P foot surgery   THERAPY DIAG:  Pain in left ankle and joints of left foot  Stiffness of left ankle, not elsewhere classified  Rationale for Evaluation and Treatment: Rehabilitation  ONSET DATE: 09/20/23  SUBJECTIVE:   SUBJECTIVE STATEMENT: Patient reports that she is hurting more, but she attributes this to the rain earlier this afternoon. She was able to walk two blocks, but she did not like it. She was able to do it in about 6-7 minutes. She feels like  her pain is more of a bone pain.   PERTINENT HISTORY: Epilepsy, fibromyalgia, history of seizures, chronic low back pain, and anxiety  PAIN:  Are you having pain? Yes: NPRS scale: 6/10 Pain location: posterior ankle and plantar surface of the foot Pain description: constant dull  Aggravating factors: walking (30-45 minutes) anything touching her heel Relieving factors: heat  PRECAUTIONS: Other: Ankle  RED FLAGS: None   WEIGHT BEARING RESTRICTIONS: Yes WBAT  FALLS:  Has patient fallen in last 6 months? 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  PATIENT SURVEYS:  LEFS 18/80  COGNITION: Overall cognitive status: Within functional limits for tasks assessed     SENSATION: Patient reports no numbness or tingling  EDEMA:  Figure 8: Left: 58.5 cm Right: 57.5 cm   PALPATION: TTP: left gastroc/soleus, medial and lateral malleolus, plantar fascia  JOINT MOBILITY:   Left foot and ankle: hypomobile and painful   LOWER EXTREMITY ROM:  Active ROM Right eval Left eval  Hip flexion    Hip extension    Hip abduction    Hip adduction    Hip internal rotation    Hip external rotation    Knee flexion    Knee extension    Ankle dorsiflexion 5 -5  Ankle plantarflexion 65 25  Ankle inversion 43 11  Ankle eversion 28 5   (Blank rows = not tested)  LOWER EXTREMITY MMT: not tested due to surgical condition  MMT Right eval Left eval  Hip flexion    Hip extension    Hip abduction    Hip adduction    Hip internal rotation    Hip external rotation     Knee flexion    Knee extension    Ankle dorsiflexion    Ankle plantarflexion    Ankle inversion    Ankle eversion     (Blank rows = not tested)  GAIT: Assistive device utilized: Walker - 4 wheeled Level of assistance: Modified independence Comments: WBAT with CAM boot  TREATMENT DATE:                                     12/02/23 EXERCISE LOG  Exercise Repetitions and Resistance Comments  Nustep  L3 x 15 minutes   Rocker board  2 minutes  AP  Inversion/eversion AROM  20 reps each    Toe curls  20 reps         Blank cell = exercise not performed today  Modalities: no redness or adverse reaction to today's modalities  Date:  Unattended Estim: Ankle, IFC @ 80-150 Hz w/ 40% scan, 15 mins, Pain Vaso: Ankle, 34 degrees; low pressure , 15 mins, Pain  11/30/23     EXERCISE LOG  Exercise Repetitions and Resistance Comments  Nustep     Recumbent Bike Lvl 1 x 15 mins   Rockerboard Forward/backward and lateral x 4 mins each   Lunges  14 box x 4 mins   Leg Press 2 plates; seat 7 x 3 mins   Stairs Four stairs x 3 reps   Standing Marches    Goal Assessment See below   4way Ankle Exercises    Manual therapy  See below        Blank cell = exercise not performed today   Manual Therapy Soft Tissue Mobilization: left gastrocnemius, STW/M to left gastroc and medial left ankle to decrease pain and tone Passive ROM: left ankle, PROM into dorsiflexion and plantarflexion with gentle hold at end range to tolerancer Modalities: no redness or adverse reaction to today's modalities  Date:  Unattended Estim: Ankle, IFC 80-150 Hz, 15 mins, Pain and Edema Vaso: Ankle, 34 degrees; low pressure, 15 mins, Pain and Edema                                   10/28/23 EXERCISE LOG  Exercise Repetitions and Resistance Comments  Nustep  L1 x 10 minutes @ seat 7   Toe curls   30 reps    Manual therapy  See below    L ankle AROM  20 reps each  Open chain; all planes       Blank cell = exercise not performed today  Manual Therapy Soft Tissue Mobilization: Left gastrocnemius, soleus, anterior tibialis, and fibularis longus, for improved soft tissue extensibility Passive ROM: Left ankle, all planes to tolerance  Modalities: no adverse reaction to today's modalities  Date:  Vaso: Ankle, 34 degrees; low pressure, 15 mins, Pain and Edema  PATIENT EDUCATION:  Education details: healing, surgical condition, desensitization  Person educated: Patient Education method: Explanation Education comprehension: verbalized understanding  HOME EXERCISE PROGRAM:   ASSESSMENT:  CLINICAL IMPRESSION: Today's treatment was significantly limited by her familiar symptoms. This high irritability of her familiar symptoms limited her ability to complete new or familiar interventions. Modalities upon the conclusion of today's interventions were the most effective at reducing her symptoms. She reported feeling better upon the conclusion of treatment. She continues to require skilled physical therapy to address her remaining impairments to return to her prior level of function.    OBJECTIVE IMPAIRMENTS: Abnormal gait, decreased activity tolerance, decreased mobility, difficulty walking, decreased ROM, decreased strength, hypomobility, increased edema, impaired flexibility, impaired sensation, impaired tone, and pain.   ACTIVITY LIMITATIONS: carrying, lifting, standing, squatting, sleeping, stairs, transfers, dressing, and locomotion level  PARTICIPATION LIMITATIONS: meal prep,  cleaning, laundry, driving, shopping, community activity, and occupation  PERSONAL FACTORS: Past/current experiences, Time since onset of injury/illness/exacerbation, Transportation, and 3+ comorbidities: Epilepsy, fibromyalgia, history of seizures, chronic low back pain, and anxiety are also affecting patient's  functional outcome.   REHAB POTENTIAL: Good  CLINICAL DECISION MAKING: Evolving/moderate complexity  EVALUATION COMPLEXITY: Moderate   GOALS: Goals reviewed with patient? Yes  SHORT TERM GOALS: Target date: 11/23/23 Patient will be independent with her initial HEP. Baseline: Goal status: MET  2.  Patient will be able to complete her daily activities without her familiar pain exceeding 8/10. Baseline: depends on the day Goal status: PARTIALLY MET  3.  Patient will improve her active left ankle dorsiflexion to at least neutral for improved ankle mobility. Baseline: 6/5: 2 degrees Goal status: MET  4.  Patient will improve her LEFS score to at least 30/80 for improved perceived function with her daily activities. Baseline: 36/80 Goal status: MET  LONG TERM GOALS: Target date: 12/21/23  Patient will be independent with her advanced HEP. Baseline:  Goal status: MET  2.  Patient will improve her active left ankle dorsiflexion to at least 5 degrees for improved gait mechanics. Baseline: 6/17: 7 degrees Goal status: MET  3.  Patient will be able to demonstrate minimal to no significant gait deviations for improved functional mobility. Baseline:  Goal status: MET  4.  Patient will improve her LEFS score to at least 45/80 for improved perceived function with her daily activities. Baseline: 6/17: 41/80 Goal status: IN PROGRESS  5.  Patient will be able to navigate at least 4 steps with a reciprocal pattern for improved household mobility. Baseline:  Goal status: MET  6.  Patient will be able to return to her regular gym activities without being limited by her familiar left ankle symptoms. Baseline:  Goal status: IN PROGRESS  PLAN:  PT FREQUENCY: 2x/week  PT DURATION: 8 weeks  PLANNED INTERVENTIONS: 97164- PT Re-evaluation, 97750- Physical Performance Testing, 97110-Therapeutic exercises, 97530- Therapeutic activity, [id number]- Neuromuscular re-education, 97535- Self Care,  97140- Manual therapy, [phone]- Gait training, [phone]- Electrical stimulation (unattended), 97016- Vasopneumatic device, Patient/Family education, Balance training, Stair training, Joint mobilization, Cryotherapy, and Moist heat  PLAN FOR NEXT SESSION: Nustep, PROM, manual therapy, and modalities as needed  Lane Pinon, PT 12/02/2023, 5:22 PM

## 2023-12-07 ENCOUNTER — Ambulatory Visit

## 2023-12-07 DIAGNOSIS — M25672 Stiffness of left ankle, not elsewhere classified: Secondary | ICD-10-CM

## 2023-12-07 DIAGNOSIS — M25572 Pain in left ankle and joints of left foot: Secondary | ICD-10-CM

## 2023-12-07 NOTE — Therapy (Signed)
 OUTPATIENT PHYSICAL THERAPY LOWER EXTREMITY TREATMENT   Patient Name: [name] MRN: [id number] DOB:1981-01-09, 43 y.o., female Today's Date: 12/07/2023  END OF SESSION:  PT End of Session - 12/07/23 1557     Visit Number 12    Number of Visits 16    Date for PT Re-Evaluation 01/07/24    PT Start Time 1554    PT Stop Time 1710    PT Time Calculation (min) 76 min    Activity Tolerance Patient tolerated treatment well    Behavior During Therapy WFL for tasks assessed/performed             Past Medical History:  Diagnosis Date   Abnormal Pap smear of cervix    Acne fulminans    Allergic rhinitis    Brain cyst    Breast nodule    Cervical polyp    Epilepsy (HCC)    Fibromyalgia    GERD (gastroesophageal reflux disease)    IBS (irritable bowel syndrome)    Kidney stones    Melanoma of hip (HCC)    Menorrhagia    Migraine headache    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HCC)    Previous stillbirth or demise, antepartum    Pulmonary nodule    previous evaluation - benign   Seizures (HCC)    recently diagnosed with epilepsy   Sepsis(995.91)    Shortness of breath    due to wt   Small bowel obstruction (HCC)    Staphylococcal infection    Past Surgical History:  Procedure Laterality Date   BREAST EXCISIONAL BIOPSY Left    caesarean section     HYSTEROSCOPY WITH D & C  04/27/2011   Procedure: DILATATION AND CURETTAGE (D&C) /HYSTEROSCOPY;  Surgeon: Hargis Paradise, MD;  Location: [facility];  Service: Gynecology;  Laterality: N/A;   MELANOMA EXCISION     svd     x 1 - stillborn at 35 weeks   Patient Active Problem List   Diagnosis Date Noted   Change in bowel habit 11/17/2023   Diverticular disease of colon 11/17/2023   Colon cancer screening 11/17/2023   Fecal urgency 11/17/2023   Flatulence, eructation and gas pain 11/17/2023   Acid reflux 11/17/2023   Hemorrhage of rectum and anus 11/17/2023   Rectal bleeding 11/17/2023   Left lower quadrant pain 11/17/2023   Nausea  and vomiting 11/17/2023   Seizures (HCC) 11/17/2023   Vertigo 11/01/2023   Left foot pain 06/25/2023   Lumbar facet joint syndrome 06/25/2023   Migraine without aura and without status migrainosus, not intractable 04/07/2023   Seizure disorder, complex partial (HCC) 04/07/2023   Chronic midline low back pain without sciatica 10/16/2022   Myofascial pain syndrome, cervical 10/16/2022   History of colonic polyps 01/16/2022   Encounter for IUD insertion 09/25/2021   High grade squamous intraepithelial lesion (HGSIL), grade 3 CIN, on biopsy of cervix 09/25/2021   IUD (intrauterine device) in place 09/25/2021   Mixed hyperlipidemia 08/21/2021   Prediabetes 08/21/2021   Metabolic syndrome 08/15/2021   HPV in female 05/07/2021   Nerve pain 07/06/2020   Epilepsy (HCC) 03/07/2020   Solitary pulmonary nodule 07/10/2018   Dysuria 07/08/2018   Morbid obesity due to excess calories (HCC) 06/25/2018   DOE (dyspnea on exertion) 06/20/2018   Chronic cough 06/20/2018   GAD (generalized anxiety disorder) 10/19/2017   Insomnia due to other mental disorder 10/19/2017   Vitamin D deficiency 07/19/2015   Celiac disease 04/03/2014   PALPITATIONS 06/12/2010   CHEST  PAIN, PRECORDIAL 06/12/2010   Migraine headache 06/11/2010   IBS 06/11/2010   ALLERGIC RHINITIS 09/11/2009    PCP: Mavis Redge SAILOR, FNP  REFERRING PROVIDER: Tobie Franky SQUIBB, DPM   REFERRING DIAG: Plantar fasciitis of left foot, Achilles tendinitis of left lower extremity, S/P foot surgery   THERAPY DIAG:  Pain in left ankle and joints of left foot  Stiffness of left ankle, not elsewhere classified  Rationale for Evaluation and Treatment: Rehabilitation  ONSET DATE: 09/20/23  SUBJECTIVE:   SUBJECTIVE STATEMENT: Pt reports 4.5/10 left ankle/foot pain today.    PERTINENT HISTORY: Epilepsy, fibromyalgia, history of seizures, chronic low back pain, and anxiety  PAIN:  Are you having pain? Yes: NPRS scale: 4.5/10 Pain  location: posterior ankle and plantar surface of the foot Pain description: constant dull  Aggravating factors: walking (30-45 minutes) anything touching her heel Relieving factors: heat  PRECAUTIONS: Other: Ankle  RED FLAGS: None   WEIGHT BEARING RESTRICTIONS: Yes WBAT  FALLS:  Has patient fallen in last 6 months? 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  PATIENT SURVEYS:  LEFS 18/80  COGNITION: Overall cognitive status: Within functional limits for tasks assessed     SENSATION: Patient reports no numbness or tingling  EDEMA:  Figure 8: Left: 58.5 cm Right: 57.5 cm   PALPATION: TTP: left gastroc/soleus, medial and lateral malleolus, plantar fascia  JOINT MOBILITY:   Left foot and ankle: hypomobile and painful   LOWER EXTREMITY ROM:  Active ROM Right eval Left eval  Hip flexion    Hip extension    Hip abduction    Hip adduction    Hip internal rotation    Hip external rotation    Knee flexion    Knee extension    Ankle dorsiflexion 5 -5  Ankle plantarflexion 65 25  Ankle inversion 43 11  Ankle eversion 28 5   (Blank rows = not tested)  LOWER EXTREMITY MMT: not tested due to surgical condition  MMT Right eval Left eval  Hip flexion    Hip extension    Hip abduction    Hip adduction    Hip internal rotation    Hip external rotation    Knee flexion    Knee extension    Ankle dorsiflexion    Ankle plantarflexion    Ankle inversion    Ankle eversion     (Blank rows = not tested)  GAIT: Assistive device utilized: Walker -  4 wheeled Level of assistance: Modified independence Comments: WBAT with CAM boot  TREATMENT DATE:    12/07/23     EXERCISE LOG  Exercise Repetitions and Resistance Comments  Nustep  L3 x 5 minutes   Recumbent Bike L3 x 10 mins   Rocker board  3 minutes each   Dynadisc  CW and CCW circles 2 mins each   BAPS Left/Right x 2 mins   Inversion/eversion AROM  Red x 20 reps each   Toe curls          Blank cell = exercise not performed today   Manual Therapy Soft Tissue Mobilization: Left Foot, STW/M to left heel and plantar surface to decrease pain and tone.   Modalities: no redness or adverse reaction to today's modalities  Date:  Unattended Estim: Ankle, IFC @ 80-150 Hz w/ 40% scan, 20 mins, Pain Vaso: Ankle, 34 degrees; low pressure , 20 mins, Pain  11/30/23     EXERCISE LOG  Exercise Repetitions and Resistance Comments  Nustep     Recumbent Bike Lvl 1 x 15 mins   Rockerboard Forward/backward and lateral x 4 mins each   Lunges  14 box x 4 mins   Leg Press 2 plates; seat 7 x 3 mins   Stairs Four stairs x 3 reps   Standing Marches    Goal Assessment See below   4way Ankle Exercises    Manual therapy  See below        Blank cell = exercise not performed today   Manual Therapy Soft Tissue Mobilization: left gastrocnemius, STW/M to left gastroc and medial left ankle to decrease pain and tone Passive ROM: left ankle, PROM into dorsiflexion and plantarflexion with gentle hold at end range to tolerancer Modalities: no redness or adverse reaction to today's modalities  Date:  Unattended Estim: Ankle, IFC 80-150 Hz, 15 mins, Pain and Edema Vaso: Ankle, 34 degrees; low pressure, 15 mins, Pain and Edema                                   10/28/23 EXERCISE LOG  Exercise Repetitions and Resistance Comments  Nustep  L1 x 10 minutes @ seat 7   Toe curls   30 reps    Manual therapy  See below    L ankle AROM  20 reps each  Open chain; all planes       Blank cell = exercise not performed today  Manual Therapy Soft Tissue Mobilization: Left gastrocnemius, soleus, anterior tibialis, and fibularis longus, for improved soft tissue extensibility Passive ROM: Left ankle, all planes to tolerance  Modalities: no adverse reaction to today's modalities  Date:  Vaso: Ankle, 34 degrees; low pressure, 15 mins, Pain and Edema  PATIENT EDUCATION:  Education details: healing, surgical condition, desensitization  Person educated: Patient Education method: Explanation Education comprehension: verbalized understanding  HOME EXERCISE PROGRAM:   ASSESSMENT:  CLINICAL IMPRESSION: Pt arrives for today's treatment session reporting 4.5/10 left foot/heel pain.  Pt states that she is going to the beach this weekend and she is worried about ambulating on soft sand.  Discussed importance of having someone walk with her on the beach at all times.  Introduced exercises to assist with strengthening inversion and eversion.  STW/M performed to left heel to decrease pain and tone.  Normal responses to estim and vaso noted upon removal.  Pt reported decreased pain at completion of today's treatment session.  OBJECTIVE IMPAIRMENTS: Abnormal gait, decreased activity tolerance, decreased mobility, difficulty  walking, decreased ROM, decreased strength, hypomobility, increased edema, impaired flexibility, impaired sensation, impaired tone, and pain.   ACTIVITY LIMITATIONS: carrying, lifting, standing, squatting, sleeping, stairs, transfers, dressing, and locomotion level  PARTICIPATION LIMITATIONS: meal prep, cleaning, laundry, driving, shopping, community activity, and occupation  PERSONAL FACTORS: Past/current experiences, Time since onset of injury/illness/exacerbation, Transportation, and 3+ comorbidities: Epilepsy, fibromyalgia, history of seizures, chronic low back  pain, and anxiety are also affecting patient's functional outcome.   REHAB POTENTIAL: Good  CLINICAL DECISION MAKING: Evolving/moderate complexity  EVALUATION COMPLEXITY: Moderate   GOALS: Goals reviewed with patient? Yes  SHORT TERM GOALS: Target date: 11/23/23 Patient will be independent with her initial HEP. Baseline: Goal status: MET  2.  Patient will be able to complete her daily activities without her familiar pain exceeding 8/10. Baseline: depends on the day Goal status: PARTIALLY MET  3.  Patient will improve her active left ankle dorsiflexion to at least neutral for improved ankle mobility. Baseline: 6/5: 2 degrees Goal status: MET  4.  Patient will improve her LEFS score to at least 30/80 for improved perceived function with her daily activities. Baseline: 36/80 Goal status: MET  LONG TERM GOALS: Target date: 12/21/23  Patient will be independent with her advanced HEP. Baseline:  Goal status: MET  2.  Patient will improve her active left ankle dorsiflexion to at least 5 degrees for improved gait mechanics. Baseline: 6/17: 7 degrees Goal status: MET  3.  Patient will be able to demonstrate minimal to no significant gait deviations for improved functional mobility. Baseline:  Goal status: MET  4.  Patient will improve her LEFS score to at least 45/80 for improved perceived function with her daily activities. Baseline: 6/17: 41/80 Goal status: IN PROGRESS  5.  Patient will be able to navigate at least 4 steps with a reciprocal pattern for improved household mobility. Baseline:  Goal status: MET  6.  Patient will be able to return to her regular gym activities without being limited by her familiar left ankle symptoms. Baseline:  Goal status: IN PROGRESS  PLAN:  PT FREQUENCY: 2x/week  PT DURATION: 8 weeks  PLANNED INTERVENTIONS: 97164- PT Re-evaluation, 97750- Physical Performance Testing, 97110-Therapeutic exercises, 97530- Therapeutic activity,  97112- Neuromuscular re-education, 97535- Self Care, 02859- Manual therapy, [phone]- Gait training, [phone]- Electrical stimulation (unattended), 97016- Vasopneumatic device, Patient/Family education, Balance training, Stair training, Joint mobilization, Cryotherapy, and Moist heat  PLAN FOR NEXT SESSION: Nustep, PROM, manual therapy, and modalities as needed  Delon DELENA Gosling, PTA 12/07/2023, 5:30 PM

## 2023-12-09 ENCOUNTER — Ambulatory Visit

## 2023-12-09 DIAGNOSIS — M25572 Pain in left ankle and joints of left foot: Secondary | ICD-10-CM | POA: Diagnosis not present

## 2023-12-09 DIAGNOSIS — M25672 Stiffness of left ankle, not elsewhere classified: Secondary | ICD-10-CM

## 2023-12-09 NOTE — Therapy (Signed)
 OUTPATIENT PHYSICAL THERAPY LOWER EXTREMITY TREATMENT   Patient Name: [name] MRN: [id number] DOB:1980/12/22, 43 y.o., female Today's Date: 12/09/2023  END OF SESSION:  PT End of Session - 12/09/23 1352     Visit Number 13    Number of Visits 16    Date for PT Re-Evaluation 01/07/24    PT Start Time 1345    PT Stop Time 1453    PT Time Calculation (min) 68 min    Activity Tolerance Patient tolerated treatment well    Behavior During Therapy WFL for tasks assessed/performed             Past Medical History:  Diagnosis Date   Abnormal Pap smear of cervix    Acne fulminans    Allergic rhinitis    Brain cyst    Breast nodule    Cervical polyp    Epilepsy (HCC)    Fibromyalgia    GERD (gastroesophageal reflux disease)    IBS (irritable bowel syndrome)    Kidney stones    Melanoma of hip (HCC)    Menorrhagia    Migraine headache    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HCC)    Previous stillbirth or demise, antepartum    Pulmonary nodule    previous evaluation - benign   Seizures (HCC)    recently diagnosed with epilepsy   Sepsis(995.91)    Shortness of breath    due to wt   Small bowel obstruction (HCC)    Staphylococcal infection    Past Surgical History:  Procedure Laterality Date   BREAST EXCISIONAL BIOPSY Left    caesarean section     HYSTEROSCOPY WITH D & C  04/27/2011   Procedure: DILATATION AND CURETTAGE (D&C) /HYSTEROSCOPY;  Surgeon: Hargis Paradise, MD;  Location: [facility];  Service: Gynecology;  Laterality: N/A;   MELANOMA EXCISION     svd     x 1 - stillborn at 49 weeks   Patient Active Problem List   Diagnosis Date Noted   Change in bowel habit 11/17/2023   Diverticular disease of colon 11/17/2023   Colon cancer screening 11/17/2023   Fecal urgency 11/17/2023   Flatulence, eructation and gas pain 11/17/2023   Acid reflux 11/17/2023   Hemorrhage of rectum and anus 11/17/2023   Rectal bleeding 11/17/2023   Left lower quadrant pain 11/17/2023   Nausea  and vomiting 11/17/2023   Seizures (HCC) 11/17/2023   Vertigo 11/01/2023   Left foot pain 06/25/2023   Lumbar facet joint syndrome 06/25/2023   Migraine without aura and without status migrainosus, not intractable 04/07/2023   Seizure disorder, complex partial (HCC) 04/07/2023   Chronic midline low back pain without sciatica 10/16/2022   Myofascial pain syndrome, cervical 10/16/2022   History of colonic polyps 01/16/2022   Encounter for IUD insertion 09/25/2021   High grade squamous intraepithelial lesion (HGSIL), grade 3 CIN, on biopsy of cervix 09/25/2021   IUD (intrauterine device) in place 09/25/2021   Mixed hyperlipidemia 08/21/2021   Prediabetes 08/21/2021   Metabolic syndrome 08/15/2021   HPV in female 05/07/2021   Nerve pain 07/06/2020   Epilepsy (HCC) 03/07/2020   Solitary pulmonary nodule 07/10/2018   Dysuria 07/08/2018   Morbid obesity due to excess calories (HCC) 06/25/2018   DOE (dyspnea on exertion) 06/20/2018   Chronic cough 06/20/2018   GAD (generalized anxiety disorder) 10/19/2017   Insomnia due to other mental disorder 10/19/2017   Vitamin D deficiency 07/19/2015   Celiac disease 04/03/2014   PALPITATIONS 06/12/2010   CHEST  PAIN, PRECORDIAL 06/12/2010   Migraine headache 06/11/2010   IBS 06/11/2010   ALLERGIC RHINITIS 09/11/2009    PCP: Mavis Redge SAILOR, FNP  REFERRING PROVIDER: Tobie Franky SQUIBB, DPM   REFERRING DIAG: Plantar fasciitis of left foot, Achilles tendinitis of left lower extremity, S/P foot surgery   THERAPY DIAG:  Pain in left ankle and joints of left foot  Stiffness of left ankle, not elsewhere classified  Rationale for Evaluation and Treatment: Rehabilitation  ONSET DATE: 09/20/23  SUBJECTIVE:   SUBJECTIVE STATEMENT: Pt reports 4/10 left ankle/foot pain today.    PERTINENT HISTORY: Epilepsy, fibromyalgia, history of seizures, chronic low back pain, and anxiety  PAIN:  Are you having pain? Yes: NPRS scale: 4/10 Pain location:  posterior ankle and plantar surface of the foot Pain description: constant dull  Aggravating factors: walking (30-45 minutes) anything touching her heel Relieving factors: heat  PRECAUTIONS: Other: Ankle  RED FLAGS: None   WEIGHT BEARING RESTRICTIONS: Yes WBAT  FALLS:  Has patient fallen in last 6 months? 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  PATIENT SURVEYS:  LEFS 18/80  COGNITION: Overall cognitive status: Within functional limits for tasks assessed     SENSATION: Patient reports no numbness or tingling  EDEMA:  Figure 8: Left: 58.5 cm Right: 57.5 cm   PALPATION: TTP: left gastroc/soleus, medial and lateral malleolus, plantar fascia  JOINT MOBILITY:   Left foot and ankle: hypomobile and painful   LOWER EXTREMITY ROM:  Active ROM Right eval Left eval  Hip flexion    Hip extension    Hip abduction    Hip adduction    Hip internal rotation    Hip external rotation    Knee flexion    Knee extension    Ankle dorsiflexion 5 -5  Ankle plantarflexion 65 25  Ankle inversion 43 11  Ankle eversion 28 5   (Blank rows = not tested)  LOWER EXTREMITY MMT: not tested due to surgical condition  MMT Right eval Left eval  Hip flexion    Hip extension    Hip abduction    Hip adduction    Hip internal rotation    Hip external rotation    Knee flexion    Knee extension    Ankle dorsiflexion    Ankle plantarflexion    Ankle inversion    Ankle eversion     (Blank rows = not tested)  GAIT: Assistive device utilized: Walker - 4  wheeled Level of assistance: Modified independence Comments: WBAT with CAM boot  TREATMENT DATE:    12/09/23     EXERCISE LOG  Exercise Repetitions and Resistance Comments  Nustep     Recumbent Bike L3 x 15 mins   Rocker board  3 minutes each   Toe Drop/Raise 6 box x 20 reps    BAPS    Inversion/eversion AROM  Red x 20 reps each   Toe curls     Standing ABCs on Ball 1 rep    Blank cell = exercise not performed today   Manual Therapy Soft Tissue Mobilization: Left Foot, STW/M to left heel and plantar surface to decrease pain and tone.   Modalities: no redness or adverse reaction to today's modalities  Date:  Unattended Estim: Ankle, IFC @ 80-150 Hz w/ 40% scan, 20 mins, Pain Vaso: Ankle, 34 degrees; low pressure , 20 mins, Pain  11/30/23     EXERCISE LOG  Exercise Repetitions and Resistance Comments  Nustep     Recumbent Bike Lvl 1 x 15 mins   Rockerboard Forward/backward and lateral x 4 mins each   Lunges  14 box x 4 mins   Leg Press 2 plates; seat 7 x 3 mins   Stairs Four stairs x 3 reps   Standing Marches    Goal Assessment See below   4way Ankle Exercises    Manual therapy  See below        Blank cell = exercise not performed today   Manual Therapy Soft Tissue Mobilization: left gastrocnemius, STW/M to left gastroc and medial left ankle to decrease pain and tone Passive ROM: left ankle, PROM into dorsiflexion and plantarflexion with gentle hold at end range to tolerancer Modalities: no redness or adverse reaction to today's modalities  Date:  Unattended Estim: Ankle, IFC 80-150 Hz, 15 mins, Pain and Edema Vaso: Ankle, 34 degrees; low pressure, 15 mins, Pain and Edema                                   10/28/23 EXERCISE LOG  Exercise Repetitions and Resistance Comments  Nustep  L1 x 10 minutes @ seat 7   Toe curls  30 reps    Manual  therapy  See below    L ankle AROM  20 reps each  Open chain; all planes       Blank cell = exercise not performed today  Manual Therapy Soft Tissue Mobilization: Left gastrocnemius, soleus, anterior tibialis, and fibularis longus, for improved soft tissue extensibility Passive ROM: Left ankle, all planes to tolerance  Modalities: no adverse reaction to today's modalities  Date:  Vaso: Ankle, 34 degrees; low pressure, 15 mins, Pain and Edema  PATIENT EDUCATION:  Education details: healing, surgical condition, desensitization  Person educated: Patient Education method: Explanation Education comprehension: verbalized understanding  HOME EXERCISE PROGRAM:   ASSESSMENT:  CLINICAL IMPRESSION: Pt arrives for today's treatment session reporting 4/10 left foot/heel pain.  Pt able to tolerate increased resistance on recumbent bike today.  Pt introduced to box toe drops and raises with transition from 6 box to 4 box required due to difficulty.  Pt also introduced to standing ABCs on small ball, but transitioned to BOSU with ball down.  STW/M performed to left heel to decrease pain and tone.  Normal responses to estim and vaso noted upon removal.  Pt reported decrease pain at completion of today's treatment session.  OBJECTIVE IMPAIRMENTS: Abnormal gait, decreased activity tolerance, decreased mobility, difficulty  walking, decreased ROM, decreased strength, hypomobility, increased edema, impaired flexibility, impaired sensation, impaired tone, and pain.   ACTIVITY LIMITATIONS: carrying, lifting, standing, squatting, sleeping, stairs, transfers, dressing, and locomotion level  PARTICIPATION LIMITATIONS: meal prep, cleaning, laundry, driving, shopping, community activity, and occupation  PERSONAL FACTORS: Past/current experiences, Time since onset of injury/illness/exacerbation, Transportation, and 3+ comorbidities: Epilepsy, fibromyalgia, history of seizures, chronic low back pain, and anxiety  are also affecting patient's functional outcome.   REHAB POTENTIAL: Good  CLINICAL DECISION MAKING: Evolving/moderate complexity  EVALUATION COMPLEXITY: Moderate   GOALS: Goals reviewed with patient? Yes  SHORT TERM GOALS: Target date: 11/23/23 Patient will be independent with her initial HEP. Baseline: Goal status: MET  2.  Patient will be able to complete her daily activities without her familiar pain exceeding 8/10. Baseline: depends on the day Goal status: PARTIALLY MET  3.  Patient will improve her active left ankle dorsiflexion to at least neutral for improved ankle mobility. Baseline: 6/5: 2 degrees Goal status: MET  4.  Patient will improve her LEFS score to at least 30/80 for improved perceived function with her daily activities. Baseline: 36/80 Goal status: MET  LONG TERM GOALS: Target date: 12/21/23  Patient will be independent with her advanced HEP. Baseline:  Goal status: MET  2.  Patient will improve her active left ankle dorsiflexion to at least 5 degrees for improved gait mechanics. Baseline: 6/17: 7 degrees Goal status: MET  3.  Patient will be able to demonstrate minimal to no significant gait deviations for improved functional mobility. Baseline:  Goal status: MET  4.  Patient will improve her LEFS score to at least 45/80 for improved perceived function with her daily activities. Baseline: 6/17: 41/80 Goal status: IN PROGRESS  5.  Patient will be able to navigate at least 4 steps with a reciprocal pattern for improved household mobility. Baseline:  Goal status: MET  6.  Patient will be able to return to her regular gym activities without being limited by her familiar left ankle symptoms. Baseline:  Goal status: IN PROGRESS  PLAN:  PT FREQUENCY: 2x/week  PT DURATION: 8 weeks  PLANNED INTERVENTIONS: 97164- PT Re-evaluation, 97750- Physical Performance Testing, 97110-Therapeutic exercises, 97530- Therapeutic activity, 97112- Neuromuscular  re-education, 97535- Self Care, 02859- Manual therapy, [phone]- Gait training, [phone]- Electrical stimulation (unattended), 97016- Vasopneumatic device, Patient/Family education, Balance training, Stair training, Joint mobilization, Cryotherapy, and Moist heat  PLAN FOR NEXT SESSION: Nustep, PROM, manual therapy, and modalities as needed  Delon DELENA Gosling, PTA 12/09/2023, 3:03 PM

## 2023-12-14 ENCOUNTER — Ambulatory Visit: Attending: Podiatry

## 2023-12-14 DIAGNOSIS — M25672 Stiffness of left ankle, not elsewhere classified: Secondary | ICD-10-CM | POA: Insufficient documentation

## 2023-12-14 DIAGNOSIS — M25572 Pain in left ankle and joints of left foot: Secondary | ICD-10-CM | POA: Insufficient documentation

## 2023-12-14 NOTE — Therapy (Signed)
 OUTPATIENT PHYSICAL THERAPY LOWER EXTREMITY TREATMENT   Patient Name: [name] MRN: [id number] DOB:[birth year], 43 y.o., female Today's Date: 12/14/2023  END OF SESSION:  PT End of Session - 12/14/23 1508     Visit Number 14    Number of Visits 16    Date for PT Re-Evaluation 01/07/24    PT Start Time 1453    PT Stop Time 1555    PT Time Calculation (min) 62 min    Activity Tolerance Patient tolerated treatment well    Behavior During Therapy WFL for tasks assessed/performed             Past Medical History:  Diagnosis Date   Abnormal Pap smear of cervix    Acne fulminans    Allergic rhinitis    Brain cyst    Breast nodule    Cervical polyp    Epilepsy (HCC)    Fibromyalgia    GERD (gastroesophageal reflux disease)    IBS (irritable bowel syndrome)    Kidney stones    Melanoma of hip (HCC)    Menorrhagia    Migraine headache    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HCC)    Previous stillbirth or demise, antepartum    Pulmonary nodule    previous evaluation - benign   Seizures (HCC)    recently diagnosed with epilepsy   Sepsis(995.91)    Shortness of breath    due to wt   Small bowel obstruction (HCC)    Staphylococcal infection    Past Surgical History:  Procedure Laterality Date   BREAST EXCISIONAL BIOPSY Left    caesarean section     HYSTEROSCOPY WITH D & C  04/27/2011   Procedure: DILATATION AND CURETTAGE (D&C) /HYSTEROSCOPY;  Surgeon: Hargis Paradise, MD;  Location: [facility];  Service: Gynecology;  Laterality: N/A;   MELANOMA EXCISION     svd     x 1 - stillborn 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 weeks   Patient Active Problem List   Diagnosis Date Noted   Change in bowel habit 11/17/2023   Diverticular disease of colon 11/17/2023   Colon cancer screening 11/17/2023   Fecal urgency 11/17/2023   Flatulence, eructation and gas pain 11/17/2023   Acid reflux 11/17/2023   Hemorrhage of rectum and anus 11/17/2023   Rectal bleeding 11/17/2023   Left lower quadrant pain 11/17/2023   Nausea  and vomiting 11/17/2023   Seizures (HCC) 11/17/2023   Vertigo 11/01/2023   Left foot pain 06/25/2023   Lumbar facet joint syndrome 06/25/2023   Migraine without aura and without status migrainosus, not intractable 04/07/2023   Seizure disorder, complex partial (HCC) 04/07/2023   Chronic midline low back pain without sciatica 10/16/2022   Myofascial pain syndrome, cervical 10/16/2022   History of colonic polyps 01/16/2022   Encounter for IUD insertion 09/25/2021   High grade squamous intraepithelial lesion (HGSIL), grade 3 CIN, on biopsy of cervix 09/25/2021   IUD (intrauterine device) in place 09/25/2021   Mixed hyperlipidemia 08/21/2021   Prediabetes 08/21/2021   Metabolic syndrome 08/15/2021   HPV in female 05/07/2021   Nerve pain 07/06/2020   Epilepsy (HCC) 03/07/2020   Solitary pulmonary nodule 07/10/2018   Dysuria 07/08/2018   Morbid obesity due to excess calories (HCC) 06/25/2018   DOE (dyspnea on exertion) 06/20/2018   Chronic cough 06/20/2018   GAD (generalized anxiety disorder) 10/19/2017   Insomnia due to other mental disorder 10/19/2017   Vitamin D deficiency 07/19/2015   Celiac disease 04/03/2014   PALPITATIONS 06/12/2010   CHEST  PAIN, PRECORDIAL 06/12/2010   Migraine headache 06/11/2010   IBS 06/11/2010   ALLERGIC RHINITIS 09/11/2009    PCP: Mavis Redge SAILOR, FNP  REFERRING PROVIDER: Tobie Franky SQUIBB, DPM   REFERRING DIAG: Plantar fasciitis of left foot, Achilles tendinitis of left lower extremity, S/P foot surgery   THERAPY DIAG:  Pain in left ankle and joints of left foot  Stiffness of left ankle, not elsewhere classified  Rationale for Evaluation and Treatment: Rehabilitation  ONSET DATE: 09/20/23  SUBJECTIVE:   SUBJECTIVE STATEMENT: Pt reports 4/10 left ankle/foot pain today.  Pt with blister on left posterior heel.    PERTINENT HISTORY: Epilepsy, fibromyalgia, history of seizures, chronic low back pain, and anxiety  PAIN:  Are you having  pain? Yes: NPRS scale: 4/10 Pain location: posterior ankle and plantar surface of the foot Pain description: constant dull  Aggravating factors: walking (30-45 minutes) anything touching her heel Relieving factors: heat  PRECAUTIONS: Other: Ankle  RED FLAGS: None   WEIGHT BEARING RESTRICTIONS: Yes WBAT  FALLS:  Has patient fallen in last 6 months? 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  PATIENT SURVEYS:  LEFS 18/80  COGNITION: Overall cognitive status: Within functional limits for tasks assessed     SENSATION: Patient reports no numbness or tingling  EDEMA:  Figure 8: Left: 58.5 cm Right: 57.5 cm   PALPATION: TTP: left gastroc/soleus, medial and lateral malleolus, plantar fascia  JOINT MOBILITY:   Left foot and ankle: hypomobile and painful   LOWER EXTREMITY ROM:  Active ROM Right eval Left eval  Hip flexion    Hip extension    Hip abduction    Hip adduction    Hip internal rotation    Hip external rotation    Knee flexion    Knee extension    Ankle dorsiflexion 5 -5  Ankle plantarflexion 65 25  Ankle inversion 43 11  Ankle eversion 28 5   (Blank rows = not tested)  LOWER EXTREMITY MMT: not tested due to surgical condition  MMT Right eval Left eval  Hip flexion    Hip extension    Hip abduction    Hip adduction    Hip internal rotation    Hip external rotation    Knee flexion    Knee extension    Ankle dorsiflexion    Ankle plantarflexion    Ankle inversion    Ankle eversion     (Blank rows = not  tested)  GAIT: Assistive device utilized: Walker - 4 wheeled Level of assistance: Modified independence Comments: WBAT with CAM boot  TREATMENT DATE:    12/14/23     EXERCISE LOG  Exercise Repetitions and Resistance Comments  Nustep     Recumbent Bike L3 x 15 mins   Rocker board  4 minutes each   Leg Press 2.5 plates; seat 5 x 4 mins   Heel/Toe Raises 30 reps each   Forward Step Ups 6 box x 20 reps    BAPS    Inversion/eversion AROM  Red x 25 reps each   Lunges  14 box x 3 mins   Standing ABCs on Ball     Blank cell = exercise not performed today   Manual Therapy Soft Tissue Mobilization: Left Foot, STW/M to left heel and plantar surface to decrease pain and tone.    11/30/23     EXERCISE LOG  Exercise Repetitions and Resistance Comments  Nustep     Recumbent Bike Lvl 1 x 15 mins   Rockerboard Forward/backward and lateral x 4 mins each   Lunges  14 box x 4 mins   Leg Press 2 plates; seat 7 x 3 mins   Stairs Four stairs x 3 reps   Standing Marches    Goal Assessment See below   4way Ankle Exercises    Manual therapy  See below        Blank cell = exercise not performed today   Manual Therapy Soft Tissue Mobilization: left gastrocnemius, STW/M to left gastroc and medial left ankle to decrease pain and tone Passive ROM: left ankle, PROM into dorsiflexion and plantarflexion with gentle hold at end range to tolerancer Modalities: no redness or adverse reaction to today's modalities  Date:  Unattended Estim: Ankle, IFC 80-150 Hz, 15 mins, Pain and Edema Vaso: Ankle, 34 degrees; low pressure, 15 mins, Pain and Edema                                   10/28/23 EXERCISE LOG  Exercise Repetitions and Resistance Comments  Nustep  L1 x 10 minutes @ seat 7   Toe curls  30 reps    Manual therapy  See below    L ankle AROM  20 reps each  Open  chain; all planes       Blank cell = exercise not performed today  Manual Therapy Soft Tissue Mobilization: Left gastrocnemius, soleus, anterior tibialis, and fibularis longus, for improved soft tissue extensibility Passive ROM: Left ankle, all planes to tolerance  Modalities: no adverse reaction to today's modalities  Date:  Vaso: Ankle, 34 degrees; low pressure, 15 mins, Pain and Edema  PATIENT EDUCATION:  Education details: healing, surgical condition, desensitization  Person educated: Patient Education method: Explanation Education comprehension: verbalized understanding  HOME EXERCISE PROGRAM:   ASSESSMENT:  CLINICAL IMPRESSION: Pt arrives for today's treatment session reporting 4/10 left foot/heel pain.  Pt with blister on left posterior heel with no sign of redness or infection.  Band-aid placed over blister for comfort.  Pt able to tolerate increased time and weight on leg press during today's treatment session.  Discussed importance of pt beginning to try activities a the gym so that she can return to her normal routine.  Pt goes back to work next Wednesday.  STW/M performed to left heel to decrease pain and tone.  Opted to not perform estim and vaso today.  Pt reported 3/10 left heel pain at completion of today's treatment session.   OBJECTIVE IMPAIRMENTS: Abnormal gait,  decreased activity tolerance, decreased mobility, difficulty walking, decreased ROM, decreased strength, hypomobility, increased edema, impaired flexibility, impaired sensation, impaired tone, and pain.   ACTIVITY LIMITATIONS: carrying, lifting, standing, squatting, sleeping, stairs, transfers, dressing, and locomotion level  PARTICIPATION LIMITATIONS: meal prep, cleaning, laundry, driving, shopping, community activity, and occupation  PERSONAL FACTORS: Past/current experiences, Time since onset of injury/illness/exacerbation, Transportation, and 3+ comorbidities: Epilepsy, fibromyalgia, history of seizures,  chronic low back pain, and anxiety are also affecting patient's functional outcome.   REHAB POTENTIAL: Good  CLINICAL DECISION MAKING: Evolving/moderate complexity  EVALUATION COMPLEXITY: Moderate   GOALS: Goals reviewed with patient? Yes  SHORT TERM GOALS: Target date: 11/23/23 Patient will be independent with her initial HEP. Baseline: Goal status: MET  2.  Patient will be able to complete her daily activities without her familiar pain exceeding 8/10. Baseline: depends on the day Goal status: PARTIALLY MET  3.  Patient will improve her active left ankle dorsiflexion to at least neutral for improved ankle mobility. Baseline: 6/5: 2 degrees Goal status: MET  4.  Patient will improve her LEFS score to at least 30/80 for improved perceived function with her daily activities. Baseline: 36/80 Goal status: MET  LONG TERM GOALS: Target date: 12/21/23  Patient will be independent with her advanced HEP. Baseline:  Goal status: MET  2.  Patient will improve her active left ankle dorsiflexion to at least 5 degrees for improved gait mechanics. Baseline: 6/17: 7 degrees Goal status: MET  3.  Patient will be able to demonstrate minimal to no significant gait deviations for improved functional mobility. Baseline:  Goal status: MET  4.  Patient will improve her LEFS score to at least 45/80 for improved perceived function with her daily activities. Baseline: 6/17: 41/80 Goal status: IN PROGRESS  5.  Patient will be able to navigate at least 4 steps with a reciprocal pattern for improved household mobility. Baseline:  Goal status: MET  6.  Patient will be able to return to her regular gym activities without being limited by her familiar left ankle symptoms. Baseline:  Goal status: IN PROGRESS  PLAN:  PT FREQUENCY: 2x/week  PT DURATION: 8 weeks  PLANNED INTERVENTIONS: 97164- PT Re-evaluation, 97750- Physical Performance Testing, 97110-Therapeutic exercises, 97530-  Therapeutic activity, [id number]- Neuromuscular re-education, 97535- Self Care, [id number]- Manual therapy, [phone]- Gait training, [phone]- Electrical stimulation (unattended), 97016- Vasopneumatic device, Patient/Family education, Balance training, Stair training, Joint mobilization, Cryotherapy, and Moist heat  PLAN FOR NEXT SESSION: Nustep, PROM, manual therapy, and modalities as needed  Delon DELENA Gosling, PTA 12/14/2023, 4:03 PM

## 2023-12-15 ENCOUNTER — Ambulatory Visit (INDEPENDENT_AMBULATORY_CARE_PROVIDER_SITE_OTHER): Admitting: Podiatry

## 2023-12-15 DIAGNOSIS — M7662 Achilles tendinitis, left leg: Secondary | ICD-10-CM

## 2023-12-15 DIAGNOSIS — M722 Plantar fascial fibromatosis: Secondary | ICD-10-CM

## 2023-12-15 DIAGNOSIS — Z9889 Other specified postprocedural states: Secondary | ICD-10-CM

## 2023-12-15 NOTE — Progress Notes (Signed)
 Subjective:  Patient ID: [name], female    DOB: 10/27/1980,  MRN: [id number]  Chief Complaint  Patient presents with   Plantar Fasciitis    DOS: 09/20/2023 Procedure: Left gastrocnemius recession with Achilles repair with Haglund's resection and endoscopic plantar fasciotomy  43 y.o. female returns for post-op check.  Patient states she is doing well manageable.  She still has some achiness she has mostly neuritis pain.  Overall improving considerably able to weight-bear as tolerated in regular shoes  Review of Systems: Negative except as noted in the HPI. Denies N/V/F/Ch.  Past Medical History:  Diagnosis Date   Abnormal Pap smear of cervix    Acne fulminans    Allergic rhinitis    Brain cyst    Breast nodule    Cervical polyp    Epilepsy (HCC)    Fibromyalgia    GERD (gastroesophageal reflux disease)    IBS (irritable bowel syndrome)    Kidney stones    Melanoma of hip (HCC)    Menorrhagia    Migraine headache    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HCC)    Previous stillbirth or demise, antepartum    Pulmonary nodule    previous evaluation - benign   Seizures (HCC)    recently diagnosed with epilepsy   Sepsis(995.91)    Shortness of breath    due to wt   Small bowel obstruction (HCC)    Staphylococcal infection     Current Outpatient Medications:    acetaminophen  (TYLENOL ) 500 MG tablet, Take 1,000 mg by mouth., Disp: , Rfl:    cyclobenzaprine  (FLEXERIL ) 10 MG tablet, Take 1 tablet (10 mg total) by mouth 3 (three) times daily as needed for muscle spasms., Disp: 30 tablet, Rfl: 0   cyclobenzaprine  (FLEXERIL ) 5 MG tablet, Take 5 mg by mouth 3 (three) times daily as needed., Disp: , Rfl:    diclofenac Sodium (VOLTAREN) 1 % GEL, Apply 2 g topically 4 (four) times daily., Disp: , Rfl:    drospirenone-ethinyl estradiol (VESTURA) 3-0.02 MG tablet, , Disp: , Rfl:    escitalopram (LEXAPRO) 20 MG tablet, Take 20 mg by mouth daily., Disp: , Rfl:    gabapentin  (NEURONTIN ) 100 MG  capsule, Take 100 mg by mouth 3 (three) times daily., Disp: , Rfl:    ketorolac  (TORADOL ) 10 MG tablet, Take 1 tablet (10 mg total) by mouth every 6 (six) hours as needed., Disp: 20 tablet, Rfl: 0   levonorgestrel (MIRENA) 20 MCG/DAY IUD, by Intrauterine route., Disp: , Rfl:    lidocaine  (LIDODERM ) 5 %, See Admin Instructions. PLEASE SEE ATTACHED FOR DETAILED DIRECTIONS, Disp: , Rfl:    meclizine  (ANTIVERT ) 25 MG tablet, Take 1 tablet (25 mg total) by mouth 3 (three) times daily as needed for dizziness., Disp: 30 tablet, Rfl: 0   meloxicam  (MOBIC ) 15 MG tablet, Take 15 mg by mouth daily. (Patient not taking: Reported on 10/29/2023), Disp: , Rfl:    oxyCODONE -acetaminophen  (PERCOCET) 5-325 MG tablet, Take 1 tablet by mouth every 4 (four) hours as needed for severe pain (pain score 7-10)., Disp: 30 tablet, Rfl: 0   oxyCODONE -acetaminophen  (PERCOCET) 5-325 MG tablet, Take 1 tablet by mouth every 4 (four) hours as needed for severe pain (pain score 7-10)., Disp: 30 tablet, Rfl: 0   traZODone (DESYREL) 50 MG tablet, Take 1 tablet by mouth at bedtime., Disp: , Rfl:    WEGOVY  0.25 MG/0.5ML SOAJ, Inject 0.5 mLs (0.25 mg dose) into the skin once a week at 0900 for 4 doses. (  Patient not taking: Reported on 10/29/2023), Disp: 2 mL, Rfl: 0   zonisamide (ZONEGRAN) 50 MG capsule, Take 100 mg by mouth 2 (two) times daily. (Patient not taking: Reported on 10/29/2023), Disp: , Rfl:   Social History   Tobacco Use  Smoking Status Never  Smokeless Tobacco Never    Allergies  Allergen Reactions   Sulfonamide Derivatives Hives, Other (See Comments) and Swelling    Other Reaction(s): Unknown   Duloxetine     Other Reaction(s): Other  Rectal bleeding    Bleeding  Rectal bleeding  Bleeding    Rectal bleeding  Bleeding  Rectal bleeding  Bleeding   Penicillins Other (See Comments)   Amitriptyline Nausea And Vomiting   Cymbalta [Duloxetine Hcl]     Rectal bleeding   Objective:  There were no vitals filed  for this visit. There is no height or weight on file to calculate BMI. Constitutional Well developed. Well nourished.  Vascular Foot warm and well perfused. Capillary refill normal to all digits.   Neurologic Normal speech. Oriented to person, place, and time. Epicritic sensation to light touch grossly present bilaterally.  Dermatologic Skin completely epithelialized.  No signs of dehiscence noted no complication noted.  Good range of motion noted reduction of tight plantar fascia noted.  Orthopedic: Mild tenderness to palpation noted about the surgical site.   Radiographs: None Assessment:   No diagnosis found.  Plan:  Patient was evaluated and treated and all questions answered.  S/p foot surgery left -Progressing as expected post-operatively. -XR: See above -WB Status: Bearing as tolerated in regular shoes -Sutures: Removed no signs of dehiscence noted no complication noted -Medications: None -Continue physical therapy as needed - She is experiencing neuritis symptoms overall she has improved considerably.  She will return to work without any restriction I will see her back for final follow-up in 8 weeks she states understanding  No follow-ups on file.

## 2023-12-16 ENCOUNTER — Ambulatory Visit

## 2023-12-16 DIAGNOSIS — M25572 Pain in left ankle and joints of left foot: Secondary | ICD-10-CM | POA: Diagnosis not present

## 2023-12-16 DIAGNOSIS — M25672 Stiffness of left ankle, not elsewhere classified: Secondary | ICD-10-CM

## 2023-12-16 NOTE — Therapy (Signed)
 OUTPATIENT PHYSICAL THERAPY LOWER EXTREMITY TREATMENT   Patient Name: [name] MRN: [id number] DOB:[birth year], 43 y.o., female Today's Date: 12/16/2023  END OF SESSION:  PT End of Session - 12/16/23 1558     Visit Number 15    Number of Visits 16    Date for PT Re-Evaluation 01/07/24    PT Start Time 1554    PT Stop Time 1653    PT Time Calculation (min) 59 min    Activity Tolerance Patient tolerated treatment well    Behavior During Therapy WFL for tasks assessed/performed             Past Medical History:  Diagnosis Date   Abnormal Pap smear of cervix    Acne fulminans    Allergic rhinitis    Brain cyst    Breast nodule    Cervical polyp    Epilepsy (HCC)    Fibromyalgia    GERD (gastroesophageal reflux disease)    IBS (irritable bowel syndrome)    Kidney stones    Melanoma of hip (HCC)    Menorrhagia    Migraine headache    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HCC)    Previous stillbirth or demise, antepartum    Pulmonary nodule    previous evaluation - benign   Seizures (HCC)    recently diagnosed with epilepsy   Sepsis(995.91)    Shortness of breath    due to wt   Small bowel obstruction (HCC)    Staphylococcal infection    Past Surgical History:  Procedure Laterality Date   BREAST EXCISIONAL BIOPSY Left    caesarean section     HYSTEROSCOPY WITH D & C  04/27/2011   Procedure: DILATATION AND CURETTAGE (D&C) /HYSTEROSCOPY;  Surgeon: Hargis Paradise, MD;  Location: [facility];  Service: Gynecology;  Laterality: N/A;   MELANOMA EXCISION     svd     x 1 - stillborn at 52 weeks   Patient Active Problem List   Diagnosis Date Noted   Change in bowel habit 11/17/2023   Diverticular disease of colon 11/17/2023   Colon cancer screening 11/17/2023   Fecal urgency 11/17/2023   Flatulence, eructation and gas pain 11/17/2023   Acid reflux 11/17/2023   Hemorrhage of rectum and anus 11/17/2023   Rectal bleeding 11/17/2023   Left lower quadrant pain 11/17/2023   Nausea  and vomiting 11/17/2023   Seizures (HCC) 11/17/2023   Vertigo 11/01/2023   Left foot pain 06/25/2023   Lumbar facet joint syndrome 06/25/2023   Migraine without aura and without status migrainosus, not intractable 04/07/2023   Seizure disorder, complex partial (HCC) 04/07/2023   Chronic midline low back pain without sciatica 10/16/2022   Myofascial pain syndrome, cervical 10/16/2022   History of colonic polyps 01/16/2022   Encounter for IUD insertion 09/25/2021   High grade squamous intraepithelial lesion (HGSIL), grade 3 CIN, on biopsy of cervix 09/25/2021   IUD (intrauterine device) in place 09/25/2021   Mixed hyperlipidemia 08/21/2021   Prediabetes 08/21/2021   Metabolic syndrome 08/15/2021   HPV in female 05/07/2021   Nerve pain 07/06/2020   Epilepsy (HCC) 03/07/2020   Solitary pulmonary nodule 07/10/2018   Dysuria 07/08/2018   Morbid obesity due to excess calories (HCC) 06/25/2018   DOE (dyspnea on exertion) 06/20/2018   Chronic cough 06/20/2018   GAD (generalized anxiety disorder) 10/19/2017   Insomnia due to other mental disorder 10/19/2017   Vitamin D deficiency 07/19/2015   Celiac disease 04/03/2014   PALPITATIONS 06/12/2010   CHEST  PAIN, PRECORDIAL 06/12/2010   Migraine headache 06/11/2010   IBS 06/11/2010   ALLERGIC RHINITIS 09/11/2009    PCP: Mavis Redge SAILOR, FNP  REFERRING PROVIDER: Tobie Franky SQUIBB, DPM   REFERRING DIAG: Plantar fasciitis of left foot, Achilles tendinitis of left lower extremity, S/P foot surgery   THERAPY DIAG:  Pain in left ankle and joints of left foot  Stiffness of left ankle, not elsewhere classified  Rationale for Evaluation and Treatment: Rehabilitation  ONSET DATE: 09/20/23  SUBJECTIVE:   SUBJECTIVE STATEMENT: Pt reports 4/10 left ankle/foot pain today.  MD pleased, cleared for work.  PERTINENT HISTORY: Epilepsy, fibromyalgia, history of seizures, chronic low back pain, and anxiety  PAIN:  Are you having pain? Yes: NPRS  scale: 4/10 Pain location: posterior ankle and plantar surface of the foot Pain description: constant dull  Aggravating factors: walking (30-45 minutes) anything touching her heel Relieving factors: heat  PRECAUTIONS: Other: Ankle  RED FLAGS: None   WEIGHT BEARING RESTRICTIONS: Yes WBAT  FALLS:  Has patient fallen in last 6 months? 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  PATIENT SURVEYS:  LEFS 18/80  COGNITION: Overall cognitive status: Within functional limits for tasks assessed     SENSATION: Patient reports no numbness or tingling  EDEMA:  Figure 8: Left: 58.5 cm Right: 57.5 cm   PALPATION: TTP: left gastroc/soleus, medial and lateral malleolus, plantar fascia  JOINT MOBILITY:   Left foot and ankle: hypomobile and painful   LOWER EXTREMITY ROM:  Active ROM Right eval Left eval  Hip flexion    Hip extension    Hip abduction    Hip adduction    Hip internal rotation    Hip external rotation    Knee flexion    Knee extension    Ankle dorsiflexion 5 -5  Ankle plantarflexion 65 25  Ankle inversion 43 11  Ankle eversion 28 5   (Blank rows = not tested)  LOWER EXTREMITY MMT: not tested due to surgical condition  MMT Right eval Left eval  Hip flexion    Hip extension    Hip abduction    Hip adduction    Hip internal rotation    Hip external rotation    Knee flexion    Knee extension    Ankle dorsiflexion    Ankle plantarflexion    Ankle inversion    Ankle eversion     (Blank rows = not tested)  GAIT: Assistive device  utilized: Walker - 4 wheeled Level of assistance: Modified independence Comments: WBAT with CAM boot  TREATMENT DATE:    12/16/23     EXERCISE LOG  Exercise Repetitions and Resistance Comments  Nustep     Recumbent Bike L3 x 15 mins   Rocker board  5 minutes each   Leg Press 2.5 plates; seat 5 x 4 mins   Heel/Toe Raises    Forward Step Ups 6 box x 20 reps    BAPS    Inversion/eversion AROM     Lunges  14 box x 3 mins   Standing ABCs on Ball     Blank cell = exercise not performed today   Manual Therapy Soft Tissue Mobilization: Left Foot, STW/M to left heel and plantar surface to decrease pain and tone.   Modalities  Date:  Unattended Estim: Ankle, IFC 80-150 Hz, 15 mins, Pain Vaso: Ankle, 34 degrees; low pressure, 15 mins, Pain                                 11/30/23     EXERCISE LOG  Exercise Repetitions and Resistance Comments  Nustep     Recumbent Bike Lvl 1 x 15 mins   Rockerboard Forward/backward and lateral x 4 mins each   Lunges  14 box x 4 mins   Leg Press 2 plates; seat 7 x 3 mins   Stairs Four stairs x 3 reps   Standing Marches    Goal Assessment See below   4way Ankle Exercises    Manual therapy  See below        Blank cell = exercise not performed today   Manual Therapy Soft Tissue Mobilization: left gastrocnemius, STW/M to left gastroc and medial left ankle to decrease pain and tone Passive ROM: left ankle, PROM into dorsiflexion and plantarflexion with gentle hold at end range to tolerancer Modalities: no redness or adverse reaction to today's modalities  Date:  Unattended Estim: Ankle, IFC 80-150 Hz, 15 mins, Pain and Edema Vaso: Ankle, 34 degrees; low pressure, 15 mins, Pain and Edema                                   10/28/23 EXERCISE LOG  Exercise Repetitions and Resistance Comments  Nustep  L1 x 10 minutes @  seat 7   Toe curls  30 reps    Manual therapy  See below    L ankle AROM  20 reps each  Open chain; all planes       Blank cell = exercise not performed today  Manual Therapy Soft Tissue Mobilization: Left gastrocnemius, soleus, anterior tibialis, and fibularis longus, for improved soft tissue extensibility Passive ROM: Left ankle, all planes to tolerance  Modalities: no adverse reaction to today's modalities  Date:  Vaso: Ankle, 34 degrees; low pressure, 15 mins, Pain and Edema  PATIENT EDUCATION:  Education details: healing, surgical condition, desensitization  Person educated: Patient Education method: Explanation Education comprehension: verbalized understanding  HOME EXERCISE PROGRAM:   ASSESSMENT:  CLINICAL IMPRESSION: Pt arrives for today's treatment session reporting 4/10 left foot/heel pain. Pt reports that she has been cleared to go back to work by her MD.  Pt able to tolerate increased time with various exercises today.  Pt plans to order rockerboard and TENs unit for personal use.  Pt ready for discharge at next treatment session.  Normal responses to estim and vaso noted upon removal.  Pt  reported 6/10 left heel pain at completion of today's treatment session.   OBJECTIVE IMPAIRMENTS: Abnormal gait, decreased activity tolerance, decreased mobility, difficulty walking, decreased ROM, decreased strength, hypomobility, increased edema, impaired flexibility, impaired sensation, impaired tone, and pain.   ACTIVITY LIMITATIONS: carrying, lifting, standing, squatting, sleeping, stairs, transfers, dressing, and locomotion level  PARTICIPATION LIMITATIONS: meal prep, cleaning, laundry, driving, shopping, community activity, and occupation  PERSONAL FACTORS: Past/current experiences, Time since onset of injury/illness/exacerbation, Transportation, and 3+ comorbidities: Epilepsy, fibromyalgia, history of seizures, chronic low back pain, and anxiety are also affecting patient's  functional outcome.   REHAB POTENTIAL: Good  CLINICAL DECISION MAKING: Evolving/moderate complexity  EVALUATION COMPLEXITY: Moderate   GOALS: Goals reviewed with patient? Yes  SHORT TERM GOALS: Target date: 11/23/23 Patient will be independent with her initial HEP. Baseline: Goal status: MET  2.  Patient will be able to complete her daily activities without her familiar pain exceeding 8/10. Baseline: depends on the day Goal status: PARTIALLY MET  3.  Patient will improve her active left ankle dorsiflexion to at least neutral for improved ankle mobility. Baseline: 6/5: 2 degrees Goal status: MET  4.  Patient will improve her LEFS score to at least 30/80 for improved perceived function with her daily activities. Baseline: 36/80 Goal status: MET  LONG TERM GOALS: Target date: 12/21/23  Patient will be independent with her advanced HEP. Baseline:  Goal status: MET  2.  Patient will improve her active left ankle dorsiflexion to at least 5 degrees for improved gait mechanics. Baseline: 6/17: 7 degrees Goal status: MET  3.  Patient will be able to demonstrate minimal to no significant gait deviations for improved functional mobility. Baseline:  Goal status: MET  4.  Patient will improve her LEFS score to at least 45/80 for improved perceived function with her daily activities. Baseline: 6/17: 41/80 Goal status: IN PROGRESS  5.  Patient will be able to navigate at least 4 steps with a reciprocal pattern for improved household mobility. Baseline:  Goal status: MET  6.  Patient will be able to return to her regular gym activities without being limited by her familiar left ankle symptoms. Baseline:  Goal status: IN PROGRESS  PLAN:  PT FREQUENCY: 2x/week  PT DURATION: 8 weeks  PLANNED INTERVENTIONS: 97164- PT Re-evaluation, 97750- Physical Performance Testing, 97110-Therapeutic exercises, 97530- Therapeutic activity, 97112- Neuromuscular re-education, 97535- Self Care,  97140- Manual therapy, [phone]- Gait training, [phone]- Electrical stimulation (unattended), 97016- Vasopneumatic device, Patient/Family education, Balance training, Stair training, Joint mobilization, Cryotherapy, and Moist heat  PLAN FOR NEXT SESSION: Nustep, PROM, manual therapy, and modalities as needed  Delon DELENA Gosling, PTA 12/16/2023, 5:16 PM

## 2023-12-21 ENCOUNTER — Ambulatory Visit

## 2023-12-21 DIAGNOSIS — M25672 Stiffness of left ankle, not elsewhere classified: Secondary | ICD-10-CM

## 2023-12-21 DIAGNOSIS — M25572 Pain in left ankle and joints of left foot: Secondary | ICD-10-CM | POA: Diagnosis not present

## 2023-12-21 NOTE — Therapy (Addendum)
 OUTPATIENT PHYSICAL THERAPY LOWER EXTREMITY TREATMENT   Patient Name: [name] MRN: [id number] DOB:1980-09-03, 43 y.o., female Today's Date: 12/21/2023  END OF SESSION:  PT End of Session - 12/21/23 1606     Visit Number 16    Number of Visits 16    Date for PT Re-Evaluation 01/07/24    PT Start Time 1600    PT Stop Time 1710    PT Time Calculation (min) 70 min    Activity Tolerance Patient tolerated treatment well    Behavior During Therapy WFL for tasks assessed/performed             Past Medical History:  Diagnosis Date   Abnormal Pap smear of cervix    Acne fulminans    Allergic rhinitis    Brain cyst    Breast nodule    Cervical polyp    Epilepsy (HCC)    Fibromyalgia    GERD (gastroesophageal reflux disease)    IBS (irritable bowel syndrome)    Kidney stones    Melanoma of hip (HCC)    Menorrhagia    Migraine headache    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HCC)    Previous stillbirth or demise, antepartum    Pulmonary nodule    previous evaluation - benign   Seizures (HCC)    recently diagnosed with epilepsy   Sepsis(995.91)    Shortness of breath    due to wt   Small bowel obstruction (HCC)    Staphylococcal infection    Past Surgical History:  Procedure Laterality Date   BREAST EXCISIONAL BIOPSY Left    caesarean section     HYSTEROSCOPY WITH D & C  04/27/2011   Procedure: DILATATION AND CURETTAGE (D&C) /HYSTEROSCOPY;  Surgeon: Hargis Paradise, MD;  Location: [facility];  Service: Gynecology;  Laterality: N/A;   MELANOMA EXCISION     svd     x 1 - stillborn at 28 weeks   Patient Active Problem List   Diagnosis Date Noted   Change in bowel habit 11/17/2023   Diverticular disease of colon 11/17/2023   Colon cancer screening 11/17/2023   Fecal urgency 11/17/2023   Flatulence, eructation and gas pain 11/17/2023   Acid reflux 11/17/2023   Hemorrhage of rectum and anus 11/17/2023   Rectal bleeding 11/17/2023   Left lower quadrant pain 11/17/2023   Nausea  and vomiting 11/17/2023   Seizures (HCC) 11/17/2023   Vertigo 11/01/2023   Left foot pain 06/25/2023   Lumbar facet joint syndrome 06/25/2023   Migraine without aura and without status migrainosus, not intractable 04/07/2023   Seizure disorder, complex partial (HCC) 04/07/2023   Chronic midline low back pain without sciatica 10/16/2022   Myofascial pain syndrome, cervical 10/16/2022   History of colonic polyps 01/16/2022   Encounter for IUD insertion 09/25/2021   High grade squamous intraepithelial lesion (HGSIL), grade 3 CIN, on biopsy of cervix 09/25/2021   IUD (intrauterine device) in place 09/25/2021   Mixed hyperlipidemia 08/21/2021   Prediabetes 08/21/2021   Metabolic syndrome 08/15/2021   HPV in female 05/07/2021   Nerve pain 07/06/2020   Epilepsy (HCC) 03/07/2020   Solitary pulmonary nodule 07/10/2018   Dysuria 07/08/2018   Morbid obesity due to excess calories (HCC) 06/25/2018   DOE (dyspnea on exertion) 06/20/2018   Chronic cough 06/20/2018   GAD (generalized anxiety disorder) 10/19/2017   Insomnia due to other mental disorder 10/19/2017   Vitamin D deficiency 07/19/2015   Celiac disease 04/03/2014   PALPITATIONS 06/12/2010   CHEST  PAIN, PRECORDIAL 06/12/2010   Migraine headache 06/11/2010   IBS 06/11/2010   ALLERGIC RHINITIS 09/11/2009    PCP: Mavis Redge SAILOR, FNP  REFERRING PROVIDER: Tobie Franky SQUIBB, DPM   REFERRING DIAG: Plantar fasciitis of left foot, Achilles tendinitis of left lower extremity, S/P foot surgery   THERAPY DIAG:  Pain in left ankle and joints of left foot  Stiffness of left ankle, not elsewhere classified  Rationale for Evaluation and Treatment: Rehabilitation  ONSET DATE: 09/20/23  SUBJECTIVE:   SUBJECTIVE STATEMENT: Pt reports 7/10 left ankle/foot pain today.  Pt reports doing a lot over the weekend and contributes this to increased foot pain.   PERTINENT HISTORY: Epilepsy, fibromyalgia, history of seizures, chronic low back  pain, and anxiety  PAIN:  Are you having pain? Yes: NPRS scale: 7/10 Pain location: posterior ankle and plantar surface of the foot Pain description: constant dull  Aggravating factors: walking (30-45 minutes) anything touching her heel Relieving factors: heat  PRECAUTIONS: Other: Ankle  RED FLAGS: None   WEIGHT BEARING RESTRICTIONS: Yes WBAT  FALLS:  Has patient fallen in last 6 months? 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  PATIENT SURVEYS:  LEFS 18/80  COGNITION: Overall cognitive status: Within functional limits for tasks assessed     SENSATION: Patient reports no numbness or tingling  EDEMA:  Figure 8: Left: 58.5 cm Right: 57.5 cm   PALPATION: TTP: left gastroc/soleus, medial and lateral malleolus, plantar fascia  JOINT MOBILITY:   Left foot and ankle: hypomobile and painful   LOWER EXTREMITY ROM:  Active ROM Right eval Left eval  Hip flexion    Hip extension    Hip abduction    Hip adduction    Hip internal rotation    Hip external rotation    Knee flexion    Knee extension    Ankle dorsiflexion 5 -5  Ankle plantarflexion 65 25  Ankle inversion 43 11  Ankle eversion 28 5   (Blank rows = not tested)  LOWER EXTREMITY MMT: not tested due to surgical condition  MMT Right eval Left eval  Hip flexion    Hip extension    Hip abduction    Hip adduction    Hip internal rotation    Hip external rotation    Knee flexion    Knee extension    Ankle dorsiflexion    Ankle plantarflexion    Ankle inversion    Ankle eversion      (Blank rows = not tested)  GAIT: Assistive device utilized: Walker - 4 wheeled Level of assistance: Modified independence Comments: WBAT with CAM boot  TREATMENT DATE:    12/16/23     EXERCISE LOG  Exercise Repetitions and Resistance Comments  Nustep     Recumbent Bike L3 x 15 mins   Rocker board  5 minutes each   Leg Press    Heel/Toe Raises    Forward Step Ups 6 box x 20 reps    BAPS    Inversion/eversion AROM     Lunges  14 box x 3 mins   Standing ABCs on Ball     Blank cell = exercise not performed today   Manual Therapy Soft Tissue Mobilization: Left Foot, STW/M to left heel and plantar surface to decrease pain and tone.   Modalities  Date:  Unattended Estim: Ankle, IFC 80-150 Hz, 20 mins, Pain Vaso: Ankle, 34 degrees; low pressure, 20 mins, Pain                                 11/30/23     EXERCISE LOG  Exercise Repetitions and Resistance Comments  Nustep     Recumbent Bike Lvl 1 x 15 mins   Rockerboard Forward/backward and lateral x 4 mins each   Lunges  14 box x 4 mins   Leg Press 2 plates; seat 7 x 3 mins   Stairs Four stairs x 3 reps   Standing Marches    Goal Assessment See below   4way Ankle Exercises    Manual therapy  See below        Blank cell = exercise not performed today   Manual Therapy Soft Tissue Mobilization: left gastrocnemius, STW/M to left gastroc and medial left ankle to decrease pain and tone Passive ROM: left ankle, PROM into dorsiflexion and plantarflexion with gentle hold at end range to tolerancer Modalities: no redness or adverse reaction to today's modalities  Date:  Unattended Estim: Ankle, IFC 80-150 Hz, 15 mins, Pain and Edema Vaso: Ankle, 34 degrees; low pressure, 15 mins, Pain and Edema                                   10/28/23 EXERCISE LOG  Exercise Repetitions and Resistance Comments   Nustep  L1 x 10 minutes @ seat 7   Toe curls  30 reps    Manual therapy  See below    L ankle AROM  20 reps each  Open chain; all planes       Blank cell = exercise not performed today  Manual Therapy Soft Tissue Mobilization: Left gastrocnemius, soleus, anterior tibialis, and fibularis longus, for improved soft tissue extensibility Passive ROM: Left ankle, all planes to tolerance  Modalities: no adverse reaction to today's modalities  Date:  Vaso: Ankle, 34 degrees; low pressure, 15 mins, Pain and Edema  PATIENT EDUCATION:  Education details: healing, surgical condition, desensitization  Person educated: Patient Education method: Explanation Education comprehension: verbalized understanding  HOME EXERCISE PROGRAM:   ASSESSMENT:  CLINICAL IMPRESSION: Pt arrives for today's treatment session reporting 7/10 left foot/heel pain.  Pt able to increase LEFs score to 57/80 surpassing her LTG.  Pt states that she has been able to return to the gym, but has not been lifting heavy weights at this point in time.  Pt has met all of the goals set forth for her by physical therapy at this time.  Pt encouraged to call the facility with any questions  or concerns.  Normal responses to estim and vaso noted upon removal.  Pt reported 5/10 left foot/heel pain.  Pt ready for discharge at this time.   PHYSICAL THERAPY DISCHARGE SUMMARY  Visits from Start of Care: 16  Current functional level related to goals / functional outcomes: Patient was able to meet or nearly meet all of her goals for skilled physical therapy.    Remaining deficits: Intermittent pain   Education / Equipment: HEP    Patient agrees to discharge. Patient goals were partially met. Patient is being discharged due to being pleased with the current functional level.  Lacinda Fass, PT, DPT    OBJECTIVE IMPAIRMENTS: Abnormal gait, decreased activity tolerance, decreased mobility, difficulty walking, decreased ROM, decreased  strength, hypomobility, increased edema, impaired flexibility, impaired sensation, impaired tone, and pain.   ACTIVITY LIMITATIONS: carrying, lifting, standing, squatting, sleeping, stairs, transfers, dressing, and locomotion level  PARTICIPATION LIMITATIONS: meal prep, cleaning, laundry, driving, shopping, community activity, and occupation  PERSONAL FACTORS: Past/current experiences, Time since onset of injury/illness/exacerbation, Transportation, and 3+ comorbidities: Epilepsy, fibromyalgia, history of seizures, chronic low back pain, and anxiety are also affecting patient's functional outcome.   REHAB POTENTIAL: Good  CLINICAL DECISION MAKING: Evolving/moderate complexity  EVALUATION COMPLEXITY: Moderate   GOALS: Goals reviewed with patient? Yes  SHORT TERM GOALS: Target date: 11/23/23 Patient will be independent with her initial HEP. Baseline: Goal status: MET  2.  Patient will be able to complete her daily activities without her familiar pain exceeding 8/10. Baseline: depends on the day Goal status: PARTIALLY MET  3.  Patient will improve her active left ankle dorsiflexion to at least neutral for improved ankle mobility. Baseline: 6/5: 2 degrees Goal status: MET  4.  Patient will improve her LEFS score to at least 30/80 for improved perceived function with her daily activities. Baseline: 36/80 Goal status: MET  LONG TERM GOALS: Target date: 12/21/23  Patient will be independent with her advanced HEP. Baseline:  Goal status: MET  2.  Patient will improve her active left ankle dorsiflexion to at least 5 degrees for improved gait mechanics. Baseline: 6/17: 7 degrees Goal status: MET  3.  Patient will be able to demonstrate minimal to no significant gait deviations for improved functional mobility. Baseline:  Goal status: MET  4.  Patient will improve her LEFS score to at least 45/80 for improved perceived function with her daily activities. Baseline: 6/17: 41/80;  7/8: 57/80 Goal status: MET  5.  Patient will be able to navigate at least 4 steps with a reciprocal pattern for improved household mobility. Baseline:  Goal status: MET  6.  Patient will be able to return to her regular gym activities without being limited by her familiar left ankle symptoms. Baseline:  Goal status: MET   PLAN:  PT FREQUENCY: 2x/week  PT DURATION: 8 weeks  PLANNED INTERVENTIONS: 97164- PT Re-evaluation, 97750- Physical Performance Testing, 97110-Therapeutic exercises, 97530- Therapeutic activity, 97112- Neuromuscular re-education, 97535- Self Care, [id number]- Manual therapy, [phone]- Gait training, [phone]- Electrical stimulation (unattended), 97016- Vasopneumatic device, Patient/Family education, Balance training, Stair training, Joint mobilization, Cryotherapy, and Moist heat  PLAN FOR NEXT SESSION: Nustep, PROM, manual therapy, and modalities as needed  Delon DELENA Gosling, PTA 12/21/2023, 5:14 PM

## 2024-02-09 ENCOUNTER — Ambulatory Visit (INDEPENDENT_AMBULATORY_CARE_PROVIDER_SITE_OTHER): Admitting: Podiatry

## 2024-02-09 DIAGNOSIS — Q666 Other congenital valgus deformities of feet: Secondary | ICD-10-CM

## 2024-02-09 DIAGNOSIS — M722 Plantar fascial fibromatosis: Secondary | ICD-10-CM

## 2024-02-09 MED ORDER — MELOXICAM 15 MG PO TABS: 15.0000 mg | ORAL_TABLET | Freq: Every day | ORAL | 0 refills | Status: DC

## 2024-02-09 MED ORDER — LIDOCAINE 5 % EX OINT
1.0000 | TOPICAL_OINTMENT | CUTANEOUS | 0 refills | Status: AC | PRN
Start: 2024-02-09 — End: ?

## 2024-02-09 NOTE — Progress Notes (Unsigned)
 Subjective:  Patient ID: [name], female    DOB: [birth year],  MRN: [id number]  Chief Complaint  Patient presents with   Plantar Fasciitis    Pt stated that she is doing better she still has some discomfort she also stated that the back of her leg still feels very tight     DOS: 09/20/2023 Procedure: Left gastrocnemius recession with Achilles repair with Haglund's resection and endoscopic plantar fasciotomy  43 y.o. female returns for post-op check.  She states that she is trying to do better.  However she still has some pain.  She states that she gets a lot of nerve pain as well.  She feels some tightness and discomfort in the back of the leg.  She states is worse first thing in the morning due to plantarflexion of the foot  Review of Systems: Negative except as noted in the HPI. Denies N/V/F/Ch.  Past Medical History:  Diagnosis Date   Abnormal Pap smear of cervix    Acne fulminans    Allergic rhinitis    Brain cyst    Breast nodule    Cervical polyp    Epilepsy (HCC)    Fibromyalgia    GERD (gastroesophageal reflux disease)    IBS (irritable bowel syndrome)    Kidney stones    Melanoma of hip (HCC)    Menorrhagia    Migraine headache    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HCC)    Previous stillbirth or demise, antepartum    Pulmonary nodule    previous evaluation - benign   Seizures (HCC)    recently diagnosed with epilepsy   Sepsis(995.91)    Shortness of breath    due to wt   Small bowel obstruction (HCC)    Staphylococcal infection     Current Outpatient Medications:    lidocaine  (XYLOCAINE ) 5 % ointment, Apply 1 Application topically as needed., Disp: 35.44 g, Rfl: 0   meloxicam  (MOBIC ) 15 MG tablet, Take 1 tablet (15 mg total) by mouth daily., Disp: 30 tablet, Rfl: 0   acetaminophen  (TYLENOL ) 500 MG tablet, Take 1,000 mg by mouth., Disp: , Rfl:    cyclobenzaprine  (FLEXERIL ) 10 MG tablet, Take 1 tablet (10 mg total) by mouth 3 (three) times daily as needed for muscle  spasms., Disp: 30 tablet, Rfl: 0   cyclobenzaprine  (FLEXERIL ) 5 MG tablet, Take 5 mg by mouth 3 (three) times daily as needed., Disp: , Rfl:    diclofenac Sodium (VOLTAREN) 1 % GEL, Apply 2 g topically 4 (four) times daily., Disp: , Rfl:    drospirenone-ethinyl estradiol (VESTURA) 3-0.02 MG tablet, , Disp: , Rfl:    escitalopram (LEXAPRO) 20 MG tablet, Take 20 mg by mouth daily., Disp: , Rfl:    gabapentin  (NEURONTIN ) 100 MG capsule, Take 100 mg by mouth 3 (three) times daily., Disp: , Rfl:    ketorolac  (TORADOL ) 10 MG tablet, Take 1 tablet (10 mg total) by mouth every 6 (six) hours as needed., Disp: 20 tablet, Rfl: 0   levonorgestrel (MIRENA) 20 MCG/DAY IUD, by Intrauterine route., Disp: , Rfl:    lidocaine  (LIDODERM ) 5 %, See Admin Instructions. PLEASE SEE ATTACHED FOR DETAILED DIRECTIONS, Disp: , Rfl:    meclizine  (ANTIVERT ) 25 MG tablet, Take 1 tablet (25 mg total) by mouth 3 (three) times daily as needed for dizziness., Disp: 30 tablet, Rfl: 0   meloxicam  (MOBIC ) 15 MG tablet, Take 15 mg by mouth daily. (Patient not taking: Reported on 10/29/2023), Disp: , Rfl:    oxyCODONE -acetaminophen  (  PERCOCET) 5-325 MG tablet, Take 1 tablet by mouth every 4 (four) hours as needed for severe pain (pain score 7-10)., Disp: 30 tablet, Rfl: 0   oxyCODONE -acetaminophen  (PERCOCET) 5-325 MG tablet, Take 1 tablet by mouth every 4 (four) hours as needed for severe pain (pain score 7-10)., Disp: 30 tablet, Rfl: 0   traZODone (DESYREL) 50 MG tablet, Take 1 tablet by mouth at bedtime., Disp: , Rfl:    WEGOVY  0.25 MG/0.5ML SOAJ, Inject 0.5 mLs (0.25 mg dose) into the skin once a week at 0900 for 4 doses. (Patient not taking: Reported on 10/29/2023), Disp: 2 mL, Rfl: 0   zonisamide (ZONEGRAN) 50 MG capsule, Take 100 mg by mouth 2 (two) times daily. (Patient not taking: Reported on 10/29/2023), Disp: , Rfl:   Social History   Tobacco Use  Smoking Status Never  Smokeless Tobacco Never    Allergies  Allergen Reactions    Sulfonamide Derivatives Hives, Other (See Comments) and Swelling    Other Reaction(s): Unknown   Duloxetine     Other Reaction(s): Other  Rectal bleeding    Bleeding  Rectal bleeding  Bleeding    Rectal bleeding  Bleeding  Rectal bleeding  Bleeding   Penicillins Other (See Comments)   Amitriptyline Nausea And Vomiting   Cymbalta [Duloxetine Hcl]     Rectal bleeding   Objective:  There were no vitals filed for this visit. There is no height or weight on file to calculate BMI. Constitutional Well developed. Well nourished.  Vascular Foot warm and well perfused. Capillary refill normal to all digits.   Neurologic Normal speech. Oriented to person, place, and time. Epicritic sensation to light touch grossly present bilaterally.  Dermatologic Skin completely epithelialized.  No signs of dehiscence noted no complication noted.  Good range of motion noted reduction of tight plantar fascia noted.  Some neuritis symptoms noted on the bottom heel consistent with surgery  Orthopedic: Mild tenderness to palpation noted about the surgical site.   Radiographs: None Assessment:   1. Pes planovalgus   2. Plantar fasciitis of left foot     Plan:  Patient was evaluated and treated and all questions answered.  S/p foot surgery left -Progressing as expected post-operatively. -XR: See above -WB Status: Bearing as tolerated in regular shoes -Sutures: Removed no signs of dehiscence noted no complication noted -Medications: Meloxicam  was sent for anti-inflammatory relief -Continue physical therapy as needed -For her neuritis symptoms should benefit from lidocaine  jelly lidocaine  jelly was sent to the pharmacy - Night splint was dispensed to help prevent plantarflexion during nighttime to keep her foot at a 90 degree angle and stretched out Was dispensed  Pes planovalgus -I explained to patient the etiology of pes planovalgus and relationship with Planter fasciitis and various  treatment options were discussed.  Given patient foot structure in the setting of Planter fasciitis I believe patient will benefit from custom-made orthotics to help control the hindfoot motion support the arch of the foot and take the stress away from plantar fascial.  Patient agrees with the plan like to proceed with orthotics -Patient was casted for orthotics   No follow-ups on file.

## 2024-03-07 ENCOUNTER — Other Ambulatory Visit: Payer: Self-pay | Admitting: Podiatry

## 2024-03-22 ENCOUNTER — Ambulatory Visit: Admitting: Podiatry

## 2024-03-22 DIAGNOSIS — Q666 Other congenital valgus deformities of feet: Secondary | ICD-10-CM | POA: Diagnosis not present

## 2024-03-22 DIAGNOSIS — M722 Plantar fascial fibromatosis: Secondary | ICD-10-CM

## 2024-03-22 NOTE — Progress Notes (Signed)
 Subjective:  Patient ID: [name], female    DOB: Jul 21, 1980,  MRN: [id number]  Chief Complaint  Patient presents with   Foot Pain    Pt stated that she is doing okay she stated that she does have some discomfort she would like to have her foot measured to make sure she is in the right size shoe      DOS: 09/20/2023 Procedure: Left gastrocnemius recession with Achilles repair with Haglund's resection and endoscopic plantar fasciotomy  43 y.o. female returns for post-op check.  She states that she is trying to do better.  She states she is doing well feeling much better.  She is here to pick up her orthotics as well denies any other acute issues  Review of Systems: Negative except as noted in the HPI. Denies N/V/F/Ch.  Past Medical History:  Diagnosis Date   Abnormal Pap smear of cervix    Acne fulminans    Allergic rhinitis    Brain cyst    Breast nodule    Cervical polyp    Epilepsy (HCC)    Fibromyalgia    GERD (gastroesophageal reflux disease)    IBS (irritable bowel syndrome)    Kidney stones    Melanoma of hip (HCC)    Menorrhagia    Migraine headache    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HCC)    Previous stillbirth or demise, antepartum    Pulmonary nodule    previous evaluation - benign   Seizures (HCC)    recently diagnosed with epilepsy   Sepsis(995.91)    Shortness of breath    due to wt   Small bowel obstruction (HCC)    Staphylococcal infection     Current Outpatient Medications:    acetaminophen  (TYLENOL ) 500 MG tablet, Take 1,000 mg by mouth., Disp: , Rfl:    cyclobenzaprine  (FLEXERIL ) 10 MG tablet, Take 1 tablet (10 mg total) by mouth 3 (three) times daily as needed for muscle spasms., Disp: 30 tablet, Rfl: 0   cyclobenzaprine  (FLEXERIL ) 5 MG tablet, Take 5 mg by mouth 3 (three) times daily as needed., Disp: , Rfl:    diclofenac Sodium (VOLTAREN) 1 % GEL, Apply 2 g topically 4 (four) times daily., Disp: , Rfl:    drospirenone-ethinyl estradiol (VESTURA) 3-0.02  MG tablet, , Disp: , Rfl:    escitalopram (LEXAPRO) 20 MG tablet, Take 20 mg by mouth daily., Disp: , Rfl:    gabapentin  (NEURONTIN ) 100 MG capsule, Take 100 mg by mouth 3 (three) times daily., Disp: , Rfl:    ketorolac  (TORADOL ) 10 MG tablet, Take 1 tablet (10 mg total) by mouth every 6 (six) hours as needed., Disp: 20 tablet, Rfl: 0   levonorgestrel (MIRENA) 20 MCG/DAY IUD, by Intrauterine route., Disp: , Rfl:    lidocaine  (LIDODERM ) 5 %, See Admin Instructions. PLEASE SEE ATTACHED FOR DETAILED DIRECTIONS, Disp: , Rfl:    lidocaine  (XYLOCAINE ) 5 % ointment, Apply 1 Application topically as needed., Disp: 35.44 g, Rfl: 0   meclizine  (ANTIVERT ) 25 MG tablet, Take 1 tablet (25 mg total) by mouth 3 (three) times daily as needed for dizziness., Disp: 30 tablet, Rfl: 0   meloxicam  (MOBIC ) 15 MG tablet, Take 15 mg by mouth daily. (Patient not taking: Reported on 10/29/2023), Disp: , Rfl:    meloxicam  (MOBIC ) 15 MG tablet, TAKE 1 TABLET (15 MG TOTAL) BY MOUTH DAILY., Disp: 30 tablet, Rfl: 0   oxyCODONE -acetaminophen  (PERCOCET) 5-325 MG tablet, Take 1 tablet by mouth every 4 (four) hours as  needed for severe pain (pain score 7-10)., Disp: 30 tablet, Rfl: 0   oxyCODONE -acetaminophen  (PERCOCET) 5-325 MG tablet, Take 1 tablet by mouth every 4 (four) hours as needed for severe pain (pain score 7-10)., Disp: 30 tablet, Rfl: 0   traZODone (DESYREL) 50 MG tablet, Take 1 tablet by mouth at bedtime., Disp: , Rfl:    WEGOVY  0.25 MG/0.5ML SOAJ, Inject 0.5 mLs (0.25 mg dose) into the skin once a week at 0900 for 4 doses. (Patient not taking: Reported on 10/29/2023), Disp: 2 mL, Rfl: 0   zonisamide (ZONEGRAN) 50 MG capsule, Take 100 mg by mouth 2 (two) times daily. (Patient not taking: Reported on 10/29/2023), Disp: , Rfl:   Social History   Tobacco Use  Smoking Status Never  Smokeless Tobacco Never    Allergies  Allergen Reactions   Sulfonamide Derivatives Hives, Other (See Comments) and Swelling    Other  Reaction(s): Unknown   Duloxetine     Other Reaction(s): Other  Rectal bleeding    Bleeding  Rectal bleeding  Bleeding    Rectal bleeding  Bleeding  Rectal bleeding  Bleeding   Penicillins Other (See Comments)   Amitriptyline Nausea And Vomiting   Cymbalta [Duloxetine Hcl]     Rectal bleeding   Objective:  There were no vitals filed for this visit. There is no height or weight on file to calculate BMI. Constitutional Well developed. Well nourished.  Vascular Foot warm and well perfused. Capillary refill normal to all digits.   Neurologic Normal speech. Oriented to person, place, and time. Epicritic sensation to light touch grossly present bilaterally.  Dermatologic Skin completely epithelialized.  No signs of dehiscence noted no complication noted.  Good range of motion noted reduction of tight plantar fascia noted.  Some neuritis symptoms noted on the bottom heel consistent with surgery  Orthopedic: Mild tenderness to palpation noted about the surgical site.   Radiographs: None Assessment:   No diagnosis found.   Plan:  Patient was evaluated and treated and all questions answered.  S/p foot surgery left - Clinically healed patient to discharge from my care at this time if any foot and ankle issues are future she will come back and see me.  I discussed shoe gear modification orthotics management she states understanding  Pes planovalgus -I explained to patient the etiology of pes planovalgus and relationship with Planter fasciitis and various treatment options were discussed.  Given patient foot structure in the setting of Planter fasciitis I believe patient will benefit from custom-made orthotics to help control the hindfoot motion support the arch of the foot and take the stress away from plantar fascial.  Patient agrees with the plan like to proceed with orthotics - Orthotics were dispensed they are functioning well no acute complaints  No follow-ups on file.

## 2024-04-07 ENCOUNTER — Other Ambulatory Visit: Payer: Self-pay | Admitting: Podiatry

## 2024-05-27 ENCOUNTER — Other Ambulatory Visit: Payer: Self-pay | Admitting: Podiatry

## 2024-07-09 ENCOUNTER — Other Ambulatory Visit: Payer: Self-pay | Admitting: Podiatry
